# Patient Record
Sex: Female | Born: 1990 | Race: White | Hispanic: No | Marital: Single | Smoking: Former smoker
Health system: Southern US, Community
[De-identification: ages and names within clinical notes are randomized; demographics above are authoritative.]

## PROBLEM LIST (undated history)

## (undated) DIAGNOSIS — M25569 Pain in unspecified knee: Secondary | ICD-10-CM

## (undated) DIAGNOSIS — Z8719 Personal history of other diseases of the digestive system: Secondary | ICD-10-CM

## (undated) DIAGNOSIS — R51 Headache: Secondary | ICD-10-CM

## (undated) DIAGNOSIS — R519 Headache, unspecified: Secondary | ICD-10-CM

## (undated) DIAGNOSIS — O285 Abnormal chromosomal and genetic finding on antenatal screening of mother: Secondary | ICD-10-CM

## (undated) DIAGNOSIS — M545 Low back pain, unspecified: Secondary | ICD-10-CM

## (undated) DIAGNOSIS — F329 Major depressive disorder, single episode, unspecified: Secondary | ICD-10-CM

## (undated) DIAGNOSIS — K219 Gastro-esophageal reflux disease without esophagitis: Secondary | ICD-10-CM

## (undated) DIAGNOSIS — F39 Unspecified mood [affective] disorder: Secondary | ICD-10-CM

## (undated) DIAGNOSIS — F419 Anxiety disorder, unspecified: Secondary | ICD-10-CM

## (undated) DIAGNOSIS — G43109 Migraine with aura, not intractable, without status migrainosus: Secondary | ICD-10-CM

## (undated) DIAGNOSIS — G4700 Insomnia, unspecified: Secondary | ICD-10-CM

## (undated) DIAGNOSIS — F32A Depression, unspecified: Secondary | ICD-10-CM

## (undated) HISTORY — DX: Abnormal chromosomal and genetic finding on antenatal screening of mother: O28.5

## (undated) HISTORY — PX: TONSILLECTOMY: SUR1361

## (undated) HISTORY — DX: Pain in unspecified knee: M25.569

## (undated) HISTORY — DX: Major depressive disorder, single episode, unspecified: F32.9

## (undated) HISTORY — DX: Depression, unspecified: F32.A

## (undated) HISTORY — PX: WISDOM TOOTH EXTRACTION: SHX21

## (undated) HISTORY — DX: Migraine with aura, not intractable, without status migrainosus: G43.109

## (undated) HISTORY — DX: Low back pain, unspecified: M54.50

## (undated) HISTORY — DX: Insomnia, unspecified: G47.00

## (undated) HISTORY — DX: Unspecified mood (affective) disorder: F39

## (undated) HISTORY — PX: HEMORROIDECTOMY: SUR656

---
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
## 2010-11-02 ENCOUNTER — Ambulatory Visit: Payer: Self-pay | Admitting: Family Medicine

## 2013-04-21 ENCOUNTER — Inpatient Hospital Stay: Payer: Self-pay

## 2013-04-21 LAB — CBC WITH DIFFERENTIAL/PLATELET
Basophil %: 0.5 %
Eosinophil %: 1.4 %
HCT: 35.9 % (ref 35.0–47.0)
HGB: 11.9 g/dL — ABNORMAL LOW (ref 12.0–16.0)
Lymphocyte #: 1.9 10*3/uL (ref 1.0–3.6)
Lymphocyte %: 11.3 %
MCH: 28.5 pg (ref 26.0–34.0)
MCHC: 33.2 g/dL (ref 32.0–36.0)
MCV: 86 fL (ref 80–100)
Monocyte #: 0.8 x10 3/mm (ref 0.2–0.9)
Monocyte %: 4.9 %
Neutrophil #: 13.5 10*3/uL — ABNORMAL HIGH (ref 1.4–6.5)
Neutrophil %: 81.9 %
Platelet: 234 10*3/uL (ref 150–440)
RBC: 4.19 10*6/uL (ref 3.80–5.20)

## 2013-10-29 ENCOUNTER — Ambulatory Visit: Payer: Self-pay | Admitting: Internal Medicine

## 2014-05-29 NOTE — L&D Delivery Note (Signed)
Delivery Summary for Barry Dienes  Labor Events:   Preterm labor:   Rupture date:   Rupture time:   Rupture type:   Fluid Color:   Induction:   Augmentation:   Complications:   Cervical ripening:          Delivery:   Episiotomy:   Lacerations:   Repair suture:   Repair # of packets:   Blood loss (ml):    Information for the patient's newborn:  Makinzee, Durley [[id number]]    Delivery 02/24/2015 12:46 PM by  C-Section, Low Transverse Sex:  female Gestational Age: [redacted]w[redacted]d Delivery Clinician:  Rubie Maid Living?:         APGARS  One minute Five minutes Ten minutes  Skin color: 1   1      Heart rate: 2   2      Grimace: 2   2      Muscle tone: 2   2      Breathing: 2   2      Totals: 9  9      Presentation/position:      Resuscitation:   Cord information:    Disposition of cord blood:     Blood gases sent?  Complications:   Placenta: Delivered: 02/24/2015 12:47 PM  Manual removal   appearance Newborn Measurements: Weight: 6 lb 10.5 oz (3019 g)  Height:    Head circumference:    Chest circumference:    Other providers: Registered Nurse Registered Nurse Registered Nurse Neonatologist Nicholas Lose  Additional  information: Forceps:   Vacuum:   Breech:   Observed anomalies       See Cesarean section procedure note for details of surgery.    Rubie Maid, MD [facility]

## 2014-07-21 LAB — OB RESULTS CONSOLE PLATELET COUNT: PLATELETS: 295 10*3/uL

## 2014-07-21 LAB — OB RESULTS CONSOLE HIV ANTIBODY (ROUTINE TESTING): HIV: NONREACTIVE

## 2014-07-21 LAB — OB RESULTS CONSOLE VARICELLA ZOSTER ANTIBODY, IGG: VARICELLA IGG: IMMUNE

## 2014-07-21 LAB — OB RESULTS CONSOLE GC/CHLAMYDIA
Chlamydia: NEGATIVE
Gonorrhea: NEGATIVE

## 2014-07-21 LAB — OB RESULTS CONSOLE ABO/RH: RH TYPE: POSITIVE

## 2014-07-21 LAB — OB RESULTS CONSOLE HGB/HCT, BLOOD
HCT: 41 %
Hemoglobin: 13.8 g/dL

## 2014-07-21 LAB — OB RESULTS CONSOLE RUBELLA ANTIBODY, IGM: Rubella: IMMUNE

## 2014-07-21 LAB — OB RESULTS CONSOLE HEPATITIS B SURFACE ANTIGEN: HEP B S AG: NEGATIVE

## 2014-07-21 LAB — OB RESULTS CONSOLE RPR: RPR: NONREACTIVE

## 2014-07-21 LAB — OB RESULTS CONSOLE ANTIBODY SCREEN: Antibody Screen: NEGATIVE

## 2014-09-18 NOTE — Op Note (Signed)
PATIENT NAME:  Christine Arellano, Christine Arellano MR#:  [id number] DATE OF BIRTH:  [birth year]  DATE OF PROCEDURE:  04/21/2013  PREOPERATIVE DIAGNOSIS:  1.  40-6/7 week intrauterine pregnancy. 2.  Fetal intolerance to labor.  POSTOPERATIVE DIAGNOSIS:  1.  40-6/7 week intrauterine pregnancy. 2.  Fetal intolerance to labor.  PROCEDURE PERFORMED: Primary low transverse cesarean section.  SURGEON: Fulton Merry S. Marcelline Mates, MD  ASSISTANT: Gennie Alma, CNM.  ANESTHESIA: Spinal.  COMPLICATIONS: None.  ESTIMATED BLOOD LOSS: 500 mL.  FLUIDS: 1200 mL.  URINE OUTPUT: 200 mL clear urine at the end of the procedure.  FINDINGS: Female infant in cephalic presentation, clear amniotic fluid, APGARS 8 and 9.  There was a nuchal cord x1 that was reducible. Three-vessel cord of placenta. A normal-appearing uterus, tubes and ovaries.  PROCEDURE: The patient was taken to the operating room, where spinal anesthesia was placed and found to be adequate. She was then prepped and draped in the normal sterile fashion in the dorsal supine position with a leftward tilt. A Pfannenstiel skin incision was then made with a scalpel and carried down to the underlying layer of the fascia with the Bovie. The fascia was then incised in the midline, and the incision was extended laterally with Mayo scissors. The superior aspect of the fascial incision was then grasped with Kocher clamps, elevated, and the underlying rectus muscles were dissected off bluntly.  Attention was then turned to the inferior aspect of the incision, which, in similar fashion, was grasped, tented up with Kocher clamps, and the rectus muscles dissected off bluntly. The rectus muscles were then separated in the midline. The peritoneum was identified and entered bluntly. The peritoneal incision was then extended superiorly and inferiorly with good visualization of the bladder. The bladder blade was then inserted, and the vesicouterine peritoneum was identified, grasped with pickups and  entered sharply with Metzenbaum scissors.  The incision was then extended laterally and the bladder flap was created digitally. The bladder blade was then reinserted and the lower uterine segment was incised in transverse fashion with a scalpel. The uterine incision was then extended laterally. The bladder blade was removed, and the infant's head was delivered atraumatically. The nose and mouth were suctioned using bulb suction. A nuchal cord x1 was noted to be present and was easily reduced. The cord was then clamped and cut after delivery of the remainder of the fetus, and the infant was handed off to the waiting pediatricians.  Cord blood was collected. The placenta was then removed manually. The uterus was exteriorized and cleared of all clots and debris. The uterine incision was then repaired with a #0 Vicryl in a running locked fashion. A second layer of the same suture was used to obtain hemostasis. After this, the uterus was returned to the abdomen. The gutters were cleared of all clots and the peritoneum was closed with a 3-0 Vicryl. The fascia was then reapproximated with #0 Vicryl in a running fashion. The skin was closed with 4-0 Monocryl.  The patient tolerated the procedure well. Sponge, lap, and needle counts were correct x2. 2 grams of Ancef was given prior to skin incision. The patient was taken to the recovery room in stable condition.     ____________________________ Chesley Noon Marcelline Mates, MD asc:cg D: 04/21/2013 20:29:09 ET T: 04/22/2013 04:53:13 ET JOB#: [id number]  cc: Chesley Noon. Marcelline Mates, MD, <Dictator> Augusto Gamble MD ELECTRONICALLY SIGNED 04/29/2013 16:20

## 2014-10-06 NOTE — H&P (Signed)
L&D Evaluation:  History:  HPI 24yo SWF presents in active labor at [redacted]w[redacted]d, G1P0000, normal PNC to date.   Presents with contractions   Patient's Medical History depression   Patient's Surgical History other  T&A, Knee surgery 2008   Medications Pre Natal Vitamins  Tylenol (Acetaminophen)  Colace prn   Allergies NKDA   Social History none   Family History Non-Contributory   ROS:  ROS All systems were reviewed.  HEENT, CNS, GI, GU, Respiratory, CV, Renal and Musculoskeletal systems were found to be normal.   Exam:  Vital Signs stable   General no apparent distress   Mental Status clear   Chest clear   Heart normal sinus rhythm   Abdomen gravid, tender with contractions   Estimated Fetal Weight Average for gestational age   Fetal Position vtx   Edema no edema   Mebranes Intact   FHT normal rate with no decels   FHT Description 138   Ucx irregular   Ucx Frequency 5 min   Length of each Contraction 60 seconds   Ucx Pain Scale 0, epidural in place   Impression:  Impression active labor   Plan:  Plan monitor contractions and for cervical change, pitocin augmentation   Electronic Signatures: Evonnie Pat (CNM)  (Signed 24-Nov-14 05:14)  Authored: L&D Evaluation   Last Updated: 24-Nov-14 05:14 by Evonnie Pat (CNM)

## 2014-11-05 ENCOUNTER — Encounter: Payer: Self-pay | Admitting: *Deleted

## 2014-11-06 ENCOUNTER — Encounter: Payer: Self-pay | Admitting: Obstetrics and Gynecology

## 2014-11-06 ENCOUNTER — Ambulatory Visit (INDEPENDENT_AMBULATORY_CARE_PROVIDER_SITE_OTHER): Payer: Medicaid Other | Admitting: Obstetrics and Gynecology

## 2014-11-06 VITALS — BP 113/69 | HR 88 | Ht 62.0 in | Wt 179.6 lb

## 2014-11-06 DIAGNOSIS — Z3482 Encounter for supervision of other normal pregnancy, second trimester: Secondary | ICD-10-CM

## 2014-11-06 DIAGNOSIS — Z3492 Encounter for supervision of normal pregnancy, unspecified, second trimester: Secondary | ICD-10-CM

## 2014-11-06 LAB — POCT URINALYSIS DIPSTICK
Bilirubin, UA: NEGATIVE
Glucose, UA: NEGATIVE
KETONES UA: NEGATIVE
LEUKOCYTES UA: NEGATIVE
NITRITE UA: NEGATIVE
PH UA: 6
Protein, UA: NEGATIVE
RBC UA: NEGATIVE
Spec Grav, UA: 1.01
Urobilinogen, UA: 0.2

## 2014-11-06 MED ORDER — HYDROCODONE-ACETAMINOPHEN 10-325 MG PO TABS: 1.0000 | ORAL_TABLET | Freq: Four times a day (QID) | ORAL | Status: DC | PRN

## 2014-11-06 MED ORDER — RANITIDINE HCL 150 MG PO TABS: 150.0000 mg | ORAL_TABLET | Freq: Two times a day (BID) | ORAL | Status: DC

## 2014-11-06 NOTE — Progress Notes (Signed)
Pt is having acid reflux.

## 2014-11-06 NOTE — Progress Notes (Signed)
ROB- 24yo [id number] with previous c/s for FTP at term; doing well w/o complaint except for daily heartburn and reflux- not relieved with TUMs- Rx zantac given; also request refill on vicoden for HAs, has to take 2-3 tablets a week.

## 2014-11-26 ENCOUNTER — Telehealth: Payer: Self-pay | Admitting: Obstetrics and Gynecology

## 2014-11-26 NOTE — Telephone Encounter (Signed)
Called pt gave her [place] number to call about EOB

## 2014-11-26 NOTE — Telephone Encounter (Signed)
Patient called regarding a bill she received from [place]. They are telling her she is responsible for over $8000. Can you talk to Bing(chow) about this. Thanks

## 2014-12-03 ENCOUNTER — Encounter: Payer: Self-pay | Admitting: Obstetrics and Gynecology

## 2014-12-03 ENCOUNTER — Other Ambulatory Visit: Payer: Medicaid Other

## 2014-12-03 ENCOUNTER — Ambulatory Visit (INDEPENDENT_AMBULATORY_CARE_PROVIDER_SITE_OTHER): Payer: Medicaid Other | Admitting: Obstetrics and Gynecology

## 2014-12-03 VITALS — BP 110/66 | HR 77 | Wt 182.5 lb

## 2014-12-03 DIAGNOSIS — Z3493 Encounter for supervision of normal pregnancy, unspecified, third trimester: Secondary | ICD-10-CM

## 2014-12-03 DIAGNOSIS — Z331 Pregnant state, incidental: Secondary | ICD-10-CM | POA: Diagnosis not present

## 2014-12-03 DIAGNOSIS — Z131 Encounter for screening for diabetes mellitus: Secondary | ICD-10-CM

## 2014-12-03 LAB — POCT URINALYSIS DIPSTICK
Blood, UA: NEGATIVE
GLUCOSE UA: NEGATIVE
Ketones, UA: NEGATIVE
LEUKOCYTES UA: NEGATIVE
Nitrite, UA: NEGATIVE
PH UA: 7
SPEC GRAV UA: 1.01
Urobilinogen, UA: 0.2

## 2014-12-03 MED ORDER — SERTRALINE HCL 50 MG PO TABS: 50.0000 mg | ORAL_TABLET | Freq: Every day | ORAL | Status: DC

## 2014-12-03 MED ORDER — HYDROCODONE-ACETAMINOPHEN 10-325 MG PO TABS: 1.0000 | ORAL_TABLET | Freq: Four times a day (QID) | ORAL | Status: DC | PRN

## 2014-12-03 MED ORDER — TETANUS-DIPHTH-ACELL PERTUSSIS 5-2.5-18.5 LF-MCG/0.5 IM SUSP
0.5000 mL | Freq: Once | INTRAMUSCULAR | Status: AC
  Administered 2014-12-03: 0.5 mL via INTRAMUSCULAR

## 2014-12-03 NOTE — Progress Notes (Signed)
ROB & Glucola- Tdap given & Blood consent signed; Discussed depression worsening- OK to take meds- will start Zoloft 50mg  daily

## 2014-12-03 NOTE — Progress Notes (Signed)
Pt states the heartburn is better as long as she takes the medication, is still having headaches Pt is struggling with depression, feeling very "fat", states its affecting her relationship, would like to discuss with you

## 2014-12-03 NOTE — Patient Instructions (Signed)

## 2014-12-04 ENCOUNTER — Telehealth: Payer: Self-pay | Admitting: *Deleted

## 2014-12-04 LAB — GLUCOSE, 1 HOUR GESTATIONAL: Gestational Diabetes Screen: 127 mg/dL (ref 65–139)

## 2014-12-04 LAB — HEMOGLOBIN AND HEMATOCRIT, BLOOD
HEMATOCRIT: 38 % (ref 34.0–46.6)
Hemoglobin: 12.6 g/dL (ref 11.1–15.9)

## 2014-12-04 NOTE — Telephone Encounter (Signed)
-----   Message from Evonnie Pat, [place] sent at 12/04/2014  8:17 AM EDT ----- Please let her know she passed her glucola and no sins of anemia

## 2014-12-04 NOTE — Telephone Encounter (Signed)
Notified pt of normal lab results

## 2014-12-17 ENCOUNTER — Encounter: Payer: Self-pay | Admitting: Obstetrics and Gynecology

## 2014-12-17 ENCOUNTER — Ambulatory Visit (INDEPENDENT_AMBULATORY_CARE_PROVIDER_SITE_OTHER): Payer: Medicaid Other | Admitting: Obstetrics and Gynecology

## 2014-12-17 VITALS — BP 106/78 | HR 78 | Wt 182.2 lb

## 2014-12-17 DIAGNOSIS — Z3493 Encounter for supervision of normal pregnancy, unspecified, third trimester: Secondary | ICD-10-CM

## 2014-12-17 LAB — POCT URINALYSIS DIPSTICK
Blood, UA: NEGATIVE
Glucose, UA: NEGATIVE
KETONES UA: NEGATIVE
Leukocytes, UA: NEGATIVE
NITRITE UA: NEGATIVE
PH UA: 6.5
SPEC GRAV UA: 1.01
Urobilinogen, UA: 0.2

## 2014-12-17 NOTE — Progress Notes (Signed)
Pt is here states she feels a little better, doesn't really have a apetitie

## 2014-12-17 NOTE — Progress Notes (Signed)
ROB- feeling a little better, but no appetite, to push frequent small snacks and increase fluid intake.

## 2014-12-22 ENCOUNTER — Telehealth: Payer: Self-pay | Admitting: Internal Medicine

## 2014-12-22 ENCOUNTER — Telehealth: Payer: Self-pay | Admitting: Obstetrics and Gynecology

## 2014-12-22 NOTE — Telephone Encounter (Signed)
Pt would like to have her teeth cleaned, if ok i will fax note to dentist

## 2014-12-22 NOTE — Telephone Encounter (Signed)
error 

## 2014-12-22 NOTE — Telephone Encounter (Signed)
Ys, please fax it in

## 2014-12-22 NOTE — Telephone Encounter (Signed)
Letter was faxed to [facility]

## 2014-12-22 NOTE — Telephone Encounter (Signed)
Pt needs a dental letter faxed to [facility] in [place]. Fax number [phone].

## 2014-12-31 ENCOUNTER — Ambulatory Visit (INDEPENDENT_AMBULATORY_CARE_PROVIDER_SITE_OTHER): Payer: Medicaid Other | Admitting: Obstetrics and Gynecology

## 2014-12-31 ENCOUNTER — Encounter: Payer: Self-pay | Admitting: Obstetrics and Gynecology

## 2014-12-31 VITALS — BP 100/59 | HR 106 | Wt 182.4 lb

## 2014-12-31 DIAGNOSIS — Z3493 Encounter for supervision of normal pregnancy, unspecified, third trimester: Secondary | ICD-10-CM

## 2014-12-31 LAB — POCT URINALYSIS DIPSTICK
BILIRUBIN UA: NEGATIVE
Blood, UA: NEGATIVE
Glucose, UA: NEGATIVE
Ketones, UA: NEGATIVE
LEUKOCYTES UA: NEGATIVE
Nitrite, UA: NEGATIVE
Spec Grav, UA: 1.01
Urobilinogen, UA: 0.2
pH, UA: 6.5

## 2014-12-31 MED ORDER — DOCUSATE SODIUM 100 MG PO CAPS: 100.0000 mg | ORAL_CAPSULE | Freq: Every day | ORAL | Status: DC

## 2014-12-31 MED ORDER — SERTRALINE HCL 100 MG PO TABS: 50.0000 mg | ORAL_TABLET | Freq: Every day | ORAL | Status: DC

## 2014-12-31 MED ORDER — HYDROCODONE-ACETAMINOPHEN 10-325 MG PO TABS: 1.0000 | ORAL_TABLET | Freq: Four times a day (QID) | ORAL | Status: DC | PRN

## 2014-12-31 MED ORDER — RANITIDINE HCL 150 MG PO TABS: 150.0000 mg | ORAL_TABLET | Freq: Two times a day (BID) | ORAL | Status: DC

## 2014-12-31 NOTE — Progress Notes (Signed)
ROB- meds refilled- increased zoloft to 100mg  daily as she doesn't feel like it has helped any, needs appt with Dr Marcelline Mates to schedule pre-op and repeat c/s; reports not being able to fall asleep- to try Tylenol PM.

## 2014-12-31 NOTE — Progress Notes (Signed)
Pt states her depression isnt really any better, not any worse, just not happy Pt is having some increased pelvic pressure

## 2015-01-01 ENCOUNTER — Encounter: Payer: Medicaid Other | Admitting: Obstetrics and Gynecology

## 2015-01-01 ENCOUNTER — Telehealth: Payer: Self-pay | Admitting: Obstetrics and Gynecology

## 2015-01-01 MED ORDER — SERTRALINE HCL 100 MG PO TABS: 100.0000 mg | ORAL_TABLET | Freq: Every day | ORAL | Status: DC

## 2015-01-01 NOTE — Telephone Encounter (Signed)
zoloft  They gave her 100 mg pill and told her to break it in half, so that she would be taking same dose as before and she said Mel upped the dose

## 2015-01-01 NOTE — Telephone Encounter (Signed)
Hey which dose is pt to be on

## 2015-01-01 NOTE — Telephone Encounter (Signed)
Pt is to take 100mg  daily, sent rx to [address]

## 2015-01-01 NOTE — Telephone Encounter (Signed)
Tell her to take the whole pill, and notify pharmacy that she is now on 100mg  daily

## 2015-01-12 ENCOUNTER — Encounter: Payer: Self-pay | Admitting: Obstetrics and Gynecology

## 2015-01-12 ENCOUNTER — Ambulatory Visit (INDEPENDENT_AMBULATORY_CARE_PROVIDER_SITE_OTHER): Payer: Medicaid Other | Admitting: Obstetrics and Gynecology

## 2015-01-12 VITALS — BP 118/82 | HR 102 | Wt 175.1 lb

## 2015-01-12 DIAGNOSIS — F32A Depression, unspecified: Secondary | ICD-10-CM

## 2015-01-12 DIAGNOSIS — F329 Major depressive disorder, single episode, unspecified: Secondary | ICD-10-CM

## 2015-01-12 NOTE — Progress Notes (Signed)
ROB-ob work in pt is very tearful, upset, hasn't been eating for over 1 week, wants reassurance that baby is ok

## 2015-01-12 NOTE — Progress Notes (Signed)
Problem OB-reports partner has asked her to leave, stating he doesn't want to be with her anymore, has no appetite, and not eating any food, just drinking juice drinks, has diarrhea (common for her when stressed); denies suicidal intent; has been on increased dose zoloft since last visit. But with current circumstances can't tell if it is working; has mother local and sister (in [place]) for support, although states mom is very negative and makes her feel worse. Recommend appt with counselor and assistance offered through [facility]- reluctantly agreed and will contact her case worker to set it up ASAP. Otherwise developed plan to eat freq. Small snacks high in protein and stop juice drinks.  Will keep appt with Dr Marcelline Mates in 3 days for preop.

## 2015-01-15 ENCOUNTER — Encounter: Payer: Self-pay | Admitting: Obstetrics and Gynecology

## 2015-01-15 ENCOUNTER — Ambulatory Visit (INDEPENDENT_AMBULATORY_CARE_PROVIDER_SITE_OTHER): Payer: Medicaid Other | Admitting: Obstetrics and Gynecology

## 2015-01-15 VITALS — BP 109/65 | HR 103 | Wt 174.0 lb

## 2015-01-15 DIAGNOSIS — O99343 Other mental disorders complicating pregnancy, third trimester: Secondary | ICD-10-CM

## 2015-01-15 DIAGNOSIS — O34219 Maternal care for unspecified type scar from previous cesarean delivery: Secondary | ICD-10-CM

## 2015-01-15 DIAGNOSIS — Z3493 Encounter for supervision of normal pregnancy, unspecified, third trimester: Secondary | ICD-10-CM

## 2015-01-15 DIAGNOSIS — O3421 Maternal care for scar from previous cesarean delivery: Secondary | ICD-10-CM

## 2015-01-15 DIAGNOSIS — F329 Major depressive disorder, single episode, unspecified: Secondary | ICD-10-CM

## 2015-01-15 LAB — POCT URINALYSIS DIPSTICK
BILIRUBIN UA: NEGATIVE
Blood, UA: NEGATIVE
Glucose, UA: NEGATIVE
LEUKOCYTES UA: NEGATIVE
Nitrite, UA: NEGATIVE
PROTEIN UA: NEGATIVE
Urobilinogen, UA: 0.2
pH, UA: 6

## 2015-01-15 NOTE — Progress Notes (Signed)
See AC per mnb to schedule c/s.

## 2015-01-16 DIAGNOSIS — O99343 Other mental disorders complicating pregnancy, third trimester: Secondary | ICD-10-CM | POA: Insufficient documentation

## 2015-01-16 DIAGNOSIS — O34219 Maternal care for unspecified type scar from previous cesarean delivery: Secondary | ICD-10-CM | POA: Insufficient documentation

## 2015-01-16 DIAGNOSIS — F329 Major depressive disorder, single episode, unspecified: Secondary | ICD-10-CM | POA: Insufficient documentation

## 2015-01-16 NOTE — Progress Notes (Signed)
ROB Consult: Patient reports that she is doing ok.  Managing social issues as best as she can.  Denies OB complaints.  Presents for pre-op, scheduling of repeat C-section (h/o prior C-section x 1, declines TOLAC).  Counseled on procedure, including risks, benefits.  Consent forms signed.  Scheduled for 02/23/2015. RTC in 2 weeks with MNB for routine OB visit.

## 2015-01-16 NOTE — H&P (Signed)
Obstetric Preoperative History and Physical  Christine Arellano is a 24 y.o. G3P1011 with IUP at [redacted]w[redacted]d presenting for pre-operative exam for scheduled repeat cesarean section. Scheduled for 02/23/2015 at [redacted] weeks gestation.  No acute concerns.   Prenatal Course Source of Care: [facility] with onset of care at 11 weeks Pregnancy complications or risks: Patient Active Problem List   Diagnosis Date Noted  . Depression affecting pregnancy in third trimester, antepartum 01/16/2015  . History of cesarean delivery affecting pregnancy 01/16/2015   She plans to breastfeed She is undecided  for postpartum contraception, but declines permanent sterilization.   Prenatal labs and studies: ABO, Rh: A/Positive/-- (02/23 0000) Antibody: Negative (02/23 0000) Rubella: Immune (02/23 0000) RPR: Nonreactive (02/23 0000)  HBsAg: Negative (02/23 0000)  HIV: Non-reactive (02/23 0000)  GBS:  pending 1 hr Glucola  normal Genetic screening normal Anatomy US normal  Prenatal Transfer Tool  Maternal Diabetes: No Genetic Screening: Normal Maternal Ultrasounds/Referrals: Normal Fetal Ultrasounds or other Referrals:  None Maternal Substance Abuse:  No Significant Maternal Medications:  Meds include: Zoloft Significant Maternal Lab Results: None  Past Medical History  Diagnosis Date  . Insomnia   . Depression     Past Surgical History  Procedure Laterality Date  . Hemorroidectomy    . Tonsillectomy      OB History  Gravida Para Term Preterm AB SAB TAB Ectopic Multiple Living  3 1 1  1 1    1     # Outcome Date GA Lbr Len/2nd Weight Sex Delivery Anes PTL Lv  3 Current           2 SAB 02/2014          1 Term 04/21/13    M CS-Unspec   Y     Complications: Fetal Intolerance      Social History   Social History  . Marital Status: Single    Spouse Name: N/A  . Number of Children: N/A  . Years of Education: N/A   Social History Main Topics  . Smoking status: Former Research scientist (life sciences)  .  Smokeless tobacco: Never Used  . Alcohol Use: No  . Drug Use: No  . Sexual Activity: Yes    Birth Control/ Protection: None   Other Topics Concern  . None   Social History Narrative    Family History  Problem Relation Age of Onset  . Depression Father   . Cancer Paternal Aunt     breast  . Heart disease Paternal Uncle      (Not in a hospital admission)  No Known Allergies  Review of Systems: Negative except for what is mentioned in HPI.  Physical Exam: BP 109/65 mmHg  Pulse 103  Wt 174 lb (78.926 kg)  LMP 05/26/2014 (LMP Unknown) FHR by Doppler: 143 bpm GENERAL: Well-developed, well-nourished female in no acute distress.  LUNGS: Clear to auscultation bilaterally.  HEART: Regular rate and rhythm. ABDOMEN: Soft, nontender, nondistended, gravid, well-healed Pfannenstiel incision. PELVIC: Deferred EXTREMITIES: Nontender, no edema, 2+ distal pulses.   Pertinent Labs/Studies:   Results for orders placed or performed in visit on 01/15/15 (from the past 72 hour(s))  POCT urinalysis dipstick     Status: None   Collection Time: 01/15/15  9:45 AM  Result Value Ref Range   Color, UA dark yellow    Clarity, UA clear    Glucose, UA neg    Bilirubin, UA neg    Ketones, UA large    Spec Grav, UA <=1.005  Blood, UA neg    pH, UA 6.0    Protein, UA neg    Urobilinogen, UA 0.2    Nitrite, UA neg    Leukocytes, UA Negative Negative    Assessment and Plan :Christine Arellano is a 24 y.o. G3P1011 at [redacted]w[redacted]d who is scheduled cesarean section delivery at [redacted] weeks gestation on 9/272016 . The patient is understanding of the planned procedure and is aware of and accepting of all surgical risks, including but not limited to: bleeding which may require transfusion or reoperation; infection which may require antibiotics; injury to bowel, bladder, ureters or other surrounding organs which may require repair; injury to the fetus; need for additional procedures including hysterectomy in the  event of life-threatening complications; placental abnormalities wth subsequent pregnancies; incisional problems; blood clot disorders which may require blood thinners;, and other postoperative/anesthesia complications. The patient is in agreement with the proposed plan, and gives informed written consent for the procedure. All questions have been answered.   Rubie Maid, MD [facility]

## 2015-01-28 ENCOUNTER — Ambulatory Visit (INDEPENDENT_AMBULATORY_CARE_PROVIDER_SITE_OTHER): Payer: Medicaid Other | Admitting: Obstetrics and Gynecology

## 2015-01-28 ENCOUNTER — Encounter: Payer: Self-pay | Admitting: Obstetrics and Gynecology

## 2015-01-28 VITALS — BP 96/57 | HR 86 | Wt 175.5 lb

## 2015-01-28 DIAGNOSIS — Z331 Pregnant state, incidental: Secondary | ICD-10-CM

## 2015-01-28 LAB — POCT URINALYSIS DIPSTICK
Bilirubin, UA: NEGATIVE
Glucose, UA: NEGATIVE
Ketones, UA: 80
Leukocytes, UA: NEGATIVE
NITRITE UA: NEGATIVE
PH UA: 6.5
RBC UA: NEGATIVE
Spec Grav, UA: 1.02
UROBILINOGEN UA: 0.2

## 2015-01-28 NOTE — Progress Notes (Signed)
+                ROB- feeling better, sleeping a little more- taking Tylenol PM as needed, cultures next visit.

## 2015-01-28 NOTE — Progress Notes (Signed)
ROB-pt is doing some better, pt had moved in with her mother,pt is having some sharp pains in her abd

## 2015-02-05 ENCOUNTER — Ambulatory Visit (INDEPENDENT_AMBULATORY_CARE_PROVIDER_SITE_OTHER): Payer: Medicaid Other | Admitting: Obstetrics and Gynecology

## 2015-02-05 ENCOUNTER — Encounter: Payer: Self-pay | Admitting: Obstetrics and Gynecology

## 2015-02-05 VITALS — BP 109/63 | HR 85 | Wt 178.4 lb

## 2015-02-05 DIAGNOSIS — B359 Dermatophytosis, unspecified: Secondary | ICD-10-CM

## 2015-02-05 DIAGNOSIS — Z3493 Encounter for supervision of normal pregnancy, unspecified, third trimester: Secondary | ICD-10-CM | POA: Diagnosis not present

## 2015-02-05 DIAGNOSIS — Z113 Encounter for screening for infections with a predominantly sexual mode of transmission: Secondary | ICD-10-CM

## 2015-02-05 DIAGNOSIS — Z36 Encounter for antenatal screening of mother: Secondary | ICD-10-CM

## 2015-02-05 DIAGNOSIS — Z3685 Encounter for antenatal screening for Streptococcus B: Secondary | ICD-10-CM

## 2015-02-05 MED ORDER — TERCONAZOLE 0.4 % VA CREA: 1.0000 | TOPICAL_CREAM | Freq: Every day | VAGINAL | Status: DC

## 2015-02-05 MED ORDER — INFLUENZA VAC SPLIT QUAD 0.5 ML IM SUSY
0.5000 mL | PREFILLED_SYRINGE | Freq: Once | INTRAMUSCULAR | Status: AC
  Administered 2015-02-05: 0.5 mL via INTRAMUSCULAR

## 2015-02-05 MED ORDER — DOCUSATE SODIUM 100 MG PO CAPS: 100.0000 mg | ORAL_CAPSULE | Freq: Every day | ORAL | Status: DC

## 2015-02-05 MED ORDER — SERTRALINE HCL 100 MG PO TABS: 100.0000 mg | ORAL_TABLET | Freq: Every day | ORAL | Status: DC

## 2015-02-05 NOTE — Progress Notes (Signed)
ROB- cultures obtained, doing better with depression; tinea of groin noted- been using monistat x 2 weeks, will switch to terazol

## 2015-02-05 NOTE — Progress Notes (Signed)
ROB-cultures obtained today, c/o B groin rash, red, tender Flu vaccine given

## 2015-02-07 LAB — STREP GP B NAA: Strep Gp B NAA: NEGATIVE

## 2015-02-08 LAB — GC/CHLAMYDIA PROBE AMP
Chlamydia trachomatis, NAA: NEGATIVE
NEISSERIA GONORRHOEAE BY PCR: NEGATIVE

## 2015-02-12 ENCOUNTER — Encounter: Payer: Medicaid Other | Admitting: Obstetrics and Gynecology

## 2015-02-16 ENCOUNTER — Encounter: Payer: Self-pay | Admitting: Obstetrics and Gynecology

## 2015-02-16 ENCOUNTER — Ambulatory Visit (INDEPENDENT_AMBULATORY_CARE_PROVIDER_SITE_OTHER): Payer: Medicaid Other | Admitting: Obstetrics and Gynecology

## 2015-02-16 VITALS — BP 104/59 | HR 81 | Wt 174.7 lb

## 2015-02-16 DIAGNOSIS — Z3493 Encounter for supervision of normal pregnancy, unspecified, third trimester: Secondary | ICD-10-CM

## 2015-02-16 LAB — POCT URINALYSIS DIPSTICK
BILIRUBIN UA: NEGATIVE
Blood, UA: NEGATIVE
GLUCOSE UA: NEGATIVE
KETONES UA: NEGATIVE
LEUKOCYTES UA: NEGATIVE
Nitrite, UA: NEGATIVE
Protein, UA: NEGATIVE
SPEC GRAV UA: 1.015
Urobilinogen, UA: 0.2
pH, UA: 7

## 2015-02-16 MED ORDER — HYDROCODONE-ACETAMINOPHEN 10-325 MG PO TABS: 1.0000 | ORAL_TABLET | Freq: Four times a day (QID) | ORAL | Status: DC | PRN

## 2015-02-16 NOTE — Progress Notes (Signed)
ROB- pt is having some pelvic pressure 

## 2015-02-16 NOTE — Progress Notes (Signed)
ROB- doing better; denies concerns

## 2015-02-19 ENCOUNTER — Encounter: Payer: Medicaid Other | Admitting: Obstetrics and Gynecology

## 2015-02-23 ENCOUNTER — Encounter
Admission: RE | Admit: 2015-02-23 | Discharge: 2015-02-23 | Disposition: A | Discharge status: Home / Self Care | Payer: Medicaid Other | Source: Ambulatory Visit | Attending: Obstetrics and Gynecology | Admitting: Obstetrics and Gynecology

## 2015-02-23 HISTORY — DX: Gastro-esophageal reflux disease without esophagitis: K21.9

## 2015-02-23 LAB — CBC
HEMATOCRIT: 36.4 % (ref 35.0–47.0)
HEMOGLOBIN: 11.8 g/dL — AB (ref 12.0–16.0)
MCH: 27.7 pg (ref 26.0–34.0)
MCHC: 32.3 g/dL (ref 32.0–36.0)
MCV: 85.5 fL (ref 80.0–100.0)
Platelets: 219 10*3/uL (ref 150–440)
RBC: 4.26 MIL/uL (ref 3.80–5.20)
RDW: 14.6 % — ABNORMAL HIGH (ref 11.5–14.5)
WBC: 8.9 10*3/uL (ref 3.6–11.0)

## 2015-02-23 LAB — TYPE AND SCREEN
ABO/RH(D): A POS
ANTIBODY SCREEN: NEGATIVE

## 2015-02-23 LAB — ABO/RH: ABO/RH(D): A POS

## 2015-02-23 MED ORDER — LACTATED RINGERS IV SOLN
INTRAVENOUS | Status: DC
  Filled 2015-02-23: qty 1000

## 2015-02-24 ENCOUNTER — Inpatient Hospital Stay: Payer: Medicaid Other | Admitting: Anesthesiology

## 2015-02-24 ENCOUNTER — Encounter
Admission: RE | Disposition: A | Discharge status: Home / Self Care | Payer: Self-pay | Source: Ambulatory Visit | Attending: Obstetrics and Gynecology

## 2015-02-24 ENCOUNTER — Inpatient Hospital Stay
Admission: RE | Admit: 2015-02-24 | Discharge: 2015-02-27 | DRG: 765 | Disposition: A | Discharge status: Home / Self Care | Payer: Medicaid Other | Source: Ambulatory Visit | Attending: Obstetrics and Gynecology | Admitting: Obstetrics and Gynecology

## 2015-02-24 ENCOUNTER — Encounter: Payer: Self-pay | Admitting: *Deleted

## 2015-02-24 DIAGNOSIS — G4700 Insomnia, unspecified: Secondary | ICD-10-CM | POA: Diagnosis present

## 2015-02-24 DIAGNOSIS — Z98891 History of uterine scar from previous surgery: Secondary | ICD-10-CM

## 2015-02-24 DIAGNOSIS — F329 Major depressive disorder, single episode, unspecified: Secondary | ICD-10-CM | POA: Diagnosis present

## 2015-02-24 DIAGNOSIS — D62 Acute posthemorrhagic anemia: Secondary | ICD-10-CM | POA: Diagnosis not present

## 2015-02-24 DIAGNOSIS — O9081 Anemia of the puerperium: Secondary | ICD-10-CM | POA: Diagnosis not present

## 2015-02-24 DIAGNOSIS — K219 Gastro-esophageal reflux disease without esophagitis: Secondary | ICD-10-CM | POA: Diagnosis present

## 2015-02-24 DIAGNOSIS — Z3A39 39 weeks gestation of pregnancy: Secondary | ICD-10-CM

## 2015-02-24 DIAGNOSIS — O34219 Maternal care for unspecified type scar from previous cesarean delivery: Secondary | ICD-10-CM | POA: Diagnosis present

## 2015-02-24 DIAGNOSIS — O34211 Maternal care for low transverse scar from previous cesarean delivery: Principal | ICD-10-CM | POA: Diagnosis present

## 2015-02-24 DIAGNOSIS — O99343 Other mental disorders complicating pregnancy, third trimester: Secondary | ICD-10-CM | POA: Diagnosis present

## 2015-02-24 DIAGNOSIS — O99344 Other mental disorders complicating childbirth: Secondary | ICD-10-CM | POA: Diagnosis present

## 2015-02-24 DIAGNOSIS — Z3493 Encounter for supervision of normal pregnancy, unspecified, third trimester: Secondary | ICD-10-CM

## 2015-02-24 DIAGNOSIS — Z87891 Personal history of nicotine dependence: Secondary | ICD-10-CM

## 2015-02-24 HISTORY — DX: History of uterine scar from previous surgery: Z98.891

## 2015-02-24 LAB — RPR: RPR Ser Ql: NONREACTIVE

## 2015-02-24 SURGERY — Surgical Case
Anesthesia: Spinal

## 2015-02-24 MED ORDER — LACTATED RINGERS IV SOLN
INTRAVENOUS | Status: DC
  Administered 2015-02-24: 12:00:00 via INTRAVENOUS

## 2015-02-24 MED ORDER — SCOPOLAMINE 1 MG/3DAYS TD PT72
1.0000 | MEDICATED_PATCH | Freq: Once | TRANSDERMAL | Status: DC
  Filled 2015-02-24: qty 1

## 2015-02-24 MED ORDER — PHENYLEPHRINE HCL 10 MG/ML IJ SOLN
INTRAMUSCULAR | Status: DC | PRN
  Administered 2015-02-24: 100 ug via INTRAVENOUS

## 2015-02-24 MED ORDER — IBUPROFEN 800 MG PO TABS
800.0000 mg | ORAL_TABLET | Freq: Three times a day (TID) | ORAL | Status: DC
  Administered 2015-02-24 – 2015-02-27 (×9): 800 mg via ORAL
  Filled 2015-02-24 (×9): qty 1

## 2015-02-24 MED ORDER — EPHEDRINE SULFATE 50 MG/ML IJ SOLN
INTRAMUSCULAR | Status: DC | PRN
  Administered 2015-02-24: 10 mg via INTRAVENOUS

## 2015-02-24 MED ORDER — MEASLES, MUMPS & RUBELLA VAC ~~LOC~~ INJ: 0.5000 mL | INJECTION | Freq: Once | SUBCUTANEOUS | Status: DC

## 2015-02-24 MED ORDER — MEPERIDINE HCL 25 MG/ML IJ SOLN: 6.2500 mg | INTRAMUSCULAR | Status: DC | PRN

## 2015-02-24 MED ORDER — OXYTOCIN 40 UNITS IN LACTATED RINGERS INFUSION - SIMPLE MED
62.5000 mL/h | INTRAVENOUS | Status: AC
  Administered 2015-02-24: 62.5 mL/h via INTRAVENOUS

## 2015-02-24 MED ORDER — SENNOSIDES-DOCUSATE SODIUM 8.6-50 MG PO TABS
2.0000 | ORAL_TABLET | ORAL | Status: DC
  Administered 2015-02-24 – 2015-02-27 (×3): 2 via ORAL
  Filled 2015-02-24 (×3): qty 2

## 2015-02-24 MED ORDER — ONDANSETRON HCL 4 MG/2ML IJ SOLN: 4.0000 mg | Freq: Three times a day (TID) | INTRAMUSCULAR | Status: DC | PRN

## 2015-02-24 MED ORDER — SIMETHICONE 80 MG PO CHEW: 80.0000 mg | CHEWABLE_TABLET | ORAL | Status: DC | PRN

## 2015-02-24 MED ORDER — SIMETHICONE 80 MG PO CHEW
80.0000 mg | CHEWABLE_TABLET | ORAL | Status: DC
  Administered 2015-02-24 – 2015-02-27 (×3): 80 mg via ORAL
  Filled 2015-02-24 (×3): qty 1

## 2015-02-24 MED ORDER — NALBUPHINE HCL 10 MG/ML IJ SOLN
5.0000 mg | INTRAMUSCULAR | Status: DC | PRN
  Filled 2015-02-24: qty 0.5

## 2015-02-24 MED ORDER — DEXTROSE 5 % IV SOLN
2.0000 g | INTRAVENOUS | Status: AC
  Administered 2015-02-24: 2 g via INTRAVENOUS
  Filled 2015-02-24: qty 2

## 2015-02-24 MED ORDER — DEXTROSE 5 % IV SOLN
2.0000 g | INTRAVENOUS | Status: DC | PRN
  Administered 2015-02-24: 2 g via INTRAVENOUS

## 2015-02-24 MED ORDER — ZOLPIDEM TARTRATE 5 MG PO TABS: 5.0000 mg | ORAL_TABLET | Freq: Every evening | ORAL | Status: DC | PRN

## 2015-02-24 MED ORDER — LACTATED RINGERS IV SOLN
INTRAVENOUS | Status: DC
  Administered 2015-02-25: 04:00:00 via INTRAVENOUS

## 2015-02-24 MED ORDER — SERTRALINE HCL 100 MG PO TABS
100.0000 mg | ORAL_TABLET | Freq: Every day | ORAL | Status: DC
  Administered 2015-02-24 – 2015-02-27 (×4): 100 mg via ORAL
  Filled 2015-02-24 (×5): qty 1

## 2015-02-24 MED ORDER — BUPIVACAINE IN DEXTROSE 0.75-8.25 % IT SOLN
INTRATHECAL | Status: DC | PRN
  Administered 2015-02-24: 1.7 mL via INTRATHECAL

## 2015-02-24 MED ORDER — NALBUPHINE HCL 10 MG/ML IJ SOLN
5.0000 mg | Freq: Once | INTRAMUSCULAR | Status: DC | PRN
  Filled 2015-02-24: qty 0.5

## 2015-02-24 MED ORDER — LIDOCAINE 5 % EX PTCH: 1.0000 | MEDICATED_PATCH | CUTANEOUS | Status: DC

## 2015-02-24 MED ORDER — CHLORHEXIDINE GLUCONATE CLOTH 2 % EX PADS
6.0000 | MEDICATED_PAD | Freq: Every day | CUTANEOUS | Status: DC
  Administered 2015-02-24: 6 via TOPICAL

## 2015-02-24 MED ORDER — NALOXONE HCL 1 MG/ML IJ SOLN: 1.0000 ug/kg/h | INTRAVENOUS | Status: DC | PRN

## 2015-02-24 MED ORDER — DIPHENHYDRAMINE HCL 25 MG PO CAPS
25.0000 mg | ORAL_CAPSULE | ORAL | Status: DC | PRN
  Filled 2015-02-24: qty 1

## 2015-02-24 MED ORDER — LIDOCAINE 5 % EX PTCH
1.0000 | MEDICATED_PATCH | CUTANEOUS | Status: DC
  Filled 2015-02-24: qty 1

## 2015-02-24 MED ORDER — TETANUS-DIPHTH-ACELL PERTUSSIS 5-2.5-18.5 LF-MCG/0.5 IM SUSP: 0.5000 mL | Freq: Once | INTRAMUSCULAR | Status: DC

## 2015-02-24 MED ORDER — LANOLIN HYDROUS EX OINT: 1.0000 "application " | TOPICAL_OINTMENT | CUTANEOUS | Status: DC | PRN

## 2015-02-24 MED ORDER — MORPHINE SULFATE (PF) 0.5 MG/ML IJ SOLN
INTRAMUSCULAR | Status: DC | PRN
  Administered 2015-02-24: .2 mg via EPIDURAL

## 2015-02-24 MED ORDER — WITCH HAZEL-GLYCERIN EX PADS: 1.0000 "application " | MEDICATED_PAD | CUTANEOUS | Status: DC | PRN

## 2015-02-24 MED ORDER — LIDOCAINE 5 % EX PTCH
MEDICATED_PATCH | CUTANEOUS | Status: DC | PRN
  Administered 2015-02-24: 1 via TRANSDERMAL

## 2015-02-24 MED ORDER — ACETAMINOPHEN 325 MG PO TABS: 650.0000 mg | ORAL_TABLET | ORAL | Status: DC | PRN

## 2015-02-24 MED ORDER — LACTATED RINGERS IV BOLUS (SEPSIS)
1000.0000 mL | Freq: Once | INTRAVENOUS | Status: AC
  Administered 2015-02-24: 1000 mL via INTRAVENOUS

## 2015-02-24 MED ORDER — DIPHENHYDRAMINE HCL 25 MG PO CAPS: 25.0000 mg | ORAL_CAPSULE | Freq: Four times a day (QID) | ORAL | Status: DC | PRN

## 2015-02-24 MED ORDER — MAGNESIUM HYDROXIDE 400 MG/5ML PO SUSP
30.0000 mL | ORAL | Status: DC | PRN
  Filled 2015-02-24: qty 30

## 2015-02-24 MED ORDER — OXYCODONE-ACETAMINOPHEN 5-325 MG PO TABS
1.0000 | ORAL_TABLET | ORAL | Status: DC | PRN
  Administered 2015-02-25 (×2): 1 via ORAL
  Filled 2015-02-24 (×2): qty 1

## 2015-02-24 MED ORDER — OXYCODONE-ACETAMINOPHEN 5-325 MG PO TABS
2.0000 | ORAL_TABLET | ORAL | Status: DC | PRN
  Administered 2015-02-26 – 2015-02-27 (×5): 2 via ORAL
  Filled 2015-02-24 (×5): qty 2

## 2015-02-24 MED ORDER — DIPHENHYDRAMINE HCL 50 MG/ML IJ SOLN: 12.5000 mg | INTRAMUSCULAR | Status: DC | PRN

## 2015-02-24 MED ORDER — OXYTOCIN 40 UNITS IN LACTATED RINGERS INFUSION - SIMPLE MED
INTRAVENOUS | Status: AC
  Administered 2015-02-24: 62.5 mL/h via INTRAVENOUS
  Filled 2015-02-24: qty 1000

## 2015-02-24 MED ORDER — DIBUCAINE 1 % RE OINT: 1.0000 "application " | TOPICAL_OINTMENT | RECTAL | Status: DC | PRN

## 2015-02-24 MED ORDER — CITRIC ACID-SODIUM CITRATE 334-500 MG/5ML PO SOLN
30.0000 mL | Freq: Once | ORAL | Status: AC
  Administered 2015-02-24: 30 mL via ORAL
  Filled 2015-02-24: qty 30

## 2015-02-24 MED ORDER — SODIUM CHLORIDE 0.9 % IJ SOLN: 3.0000 mL | INTRAMUSCULAR | Status: DC | PRN

## 2015-02-24 MED ORDER — FENTANYL CITRATE (PF) 100 MCG/2ML IJ SOLN: 25.0000 ug | INTRAMUSCULAR | Status: DC | PRN

## 2015-02-24 MED ORDER — DIPHENHYDRAMINE HCL 25 MG PO CAPS
25.0000 mg | ORAL_CAPSULE | Freq: Four times a day (QID) | ORAL | Status: DC | PRN
  Administered 2015-02-24: 25 mg via ORAL

## 2015-02-24 MED ORDER — PRENATAL MULTIVITAMIN CH
1.0000 | ORAL_TABLET | Freq: Every day | ORAL | Status: DC
  Administered 2015-02-24 – 2015-02-27 (×4): 1 via ORAL
  Filled 2015-02-24 (×5): qty 1

## 2015-02-24 MED ORDER — MENTHOL 3 MG MT LOZG: 1.0000 | LOZENGE | OROMUCOSAL | Status: DC | PRN

## 2015-02-24 MED ORDER — OXYTOCIN 40 UNITS IN LACTATED RINGERS INFUSION - SIMPLE MED
INTRAVENOUS | Status: DC | PRN
  Administered 2015-02-24: 1000 mL via INTRAVENOUS

## 2015-02-24 MED ORDER — IBUPROFEN 600 MG PO TABS: 600.0000 mg | ORAL_TABLET | Freq: Four times a day (QID) | ORAL | Status: DC | PRN

## 2015-02-24 MED ORDER — NALOXONE HCL 0.4 MG/ML IJ SOLN: 0.4000 mg | INTRAMUSCULAR | Status: DC | PRN

## 2015-02-24 MED ORDER — ONDANSETRON HCL 4 MG/2ML IJ SOLN: 4.0000 mg | Freq: Once | INTRAMUSCULAR | Status: DC | PRN

## 2015-02-24 SURGICAL SUPPLY — 25 items
BAG COUNTER SPONGE EZ (MISCELLANEOUS) ×2 IMPLANT
CANISTER SUCT 3000ML (MISCELLANEOUS) ×3 IMPLANT
CHLORAPREP W/TINT 26ML (MISCELLANEOUS) ×6 IMPLANT
COUNTER SPONGE BAG EZ (MISCELLANEOUS) ×1
DRSG TELFA 3X8 NADH (GAUZE/BANDAGES/DRESSINGS) ×3 IMPLANT
GAUZE SPONGE 4X4 12PLY STRL (GAUZE/BANDAGES/DRESSINGS) ×3 IMPLANT
GLOVE BIO SURGEON STRL SZ 6 (GLOVE) ×3 IMPLANT
GLOVE BIOGEL PI IND STRL 6.5 (GLOVE) ×1 IMPLANT
GLOVE BIOGEL PI INDICATOR 6.5 (GLOVE) ×2
GOWN STRL REUS W/ TWL LRG LVL3 (GOWN DISPOSABLE) ×2 IMPLANT
GOWN STRL REUS W/TWL LRG LVL3 (GOWN DISPOSABLE) ×4
KIT RM TURNOVER STRD PROC AR (KITS) ×3 IMPLANT
MARKER SKIN W/RULER 31145785 (MISCELLANEOUS) ×3 IMPLANT
NS IRRIG 1000ML POUR BTL (IV SOLUTION) ×3 IMPLANT
PACK C SECTION AR (MISCELLANEOUS) ×3 IMPLANT
PAD GROUND ADULT SPLIT (MISCELLANEOUS) ×3 IMPLANT
PAD OB MATERNITY 4.3X12.25 (PERSONAL CARE ITEMS) ×3 IMPLANT
PAD PREP 24X41 OB/GYN DISP (PERSONAL CARE ITEMS) ×3 IMPLANT
SUT CHROMIC 0 CT 1 (SUTURE) IMPLANT
SUT MNCRL AB 4-0 PS2 18 (SUTURE) ×3 IMPLANT
SUT PLAIN 2 0 XLH (SUTURE) IMPLANT
SUT VIC AB 0 CT1 36 (SUTURE) ×6 IMPLANT
SUT VIC AB 1 CT1 36 (SUTURE) ×6 IMPLANT
SUT VIC AB 3-0 SH 27 (SUTURE) ×2
SUT VIC AB 3-0 SH 27X BRD (SUTURE) ×1 IMPLANT

## 2015-02-24 NOTE — H&P (Signed)
Obstetric Preoperative History and Physical  Christine Arellano is a 24 y.o. G3P1011 with IUP at [redacted]w[redacted]d presenting for scheduled repeat cesarean section. History of prior C-section x 1, declined TOLAC. No acute concerns.   Prenatal Course Source of Care: [facility] with onset of care at 11 weeks Pregnancy complications or risks: Patient Active Problem List   Diagnosis Date Noted  . Depression affecting pregnancy in third trimester, antepartum 01/16/2015  . History of cesarean delivery affecting pregnancy 01/16/2015   She plans to breastfeed She is undecided  for postpartum contraception, but declines permanent sterilization.   Prenatal labs and studies: ABO, Rh: --/--/A POS (09/27 1039) Antibody: NEG (09/27 1038) Rubella: Immune (02/23 0000) RPR: Non Reactive (09/27 1100)  HBsAg: Negative (02/23 0000)  HIV: Non-reactive (02/23 0000)  GBS: negative 1 hr Glucola  normal Genetic screening normal Anatomy US normal  Prenatal Transfer Tool  Maternal Diabetes: No Genetic Screening: Normal Maternal Ultrasounds/Referrals: Normal Fetal Ultrasounds or other Referrals:  None Maternal Substance Abuse:  No Significant Maternal Medications:  Meds include: Zoloft Significant Maternal Lab Results: None  Past Medical History  Diagnosis Date  . Insomnia   . Depression   . GERD (gastroesophageal reflux disease)     Past Surgical History  Procedure Laterality Date  . Hemorroidectomy    . Tonsillectomy    . Cesarean section      OB History  Gravida Para Term Preterm AB SAB TAB Ectopic Multiple Living  3 1 1  1 1    1     # Outcome Date GA Lbr Len/2nd Weight Sex Delivery Anes PTL Lv  3 Current           2 SAB 02/2014          1 Term 04/21/13    M CS-Unspec   Y     Complications: Fetal Intolerance      Social History   Social History  . Marital Status: Single    Spouse Name: N/A  . Number of Children: N/A  . Years of Education: N/A   Social History Main Topics  .  Smoking status: Former Research scientist (life sciences)  . Smokeless tobacco: Never Used  . Alcohol Use: No  . Drug Use: No  . Sexual Activity: Yes    Birth Control/ Protection: None   Other Topics Concern  . None   Social History Narrative    Family History  Problem Relation Age of Onset  . Depression Father   . Cancer Paternal Aunt     breast  . Heart disease Paternal Uncle     Prescriptions prior to admission  Medication Sig Dispense Refill Last Dose  . Biotin 5000 MCG CAPS Take by mouth.   02/24/2015  . Prenatal Vit-Fe Fumarate-FA (PRENATAL MULTIVITAMIN) TABS tablet Take 1 tablet by mouth daily at 12 noon.   02/24/2015  . sertraline (ZOLOFT) 100 MG tablet Take 1 tablet (100 mg total) by mouth daily. 30 tablet 2 02/23/2015  . diphenhydramine-acetaminophen (TYLENOL PM) 25-500 MG TABS Take 1 tablet by mouth at bedtime as needed.   Taking  . docusate sodium (COLACE) 100 MG capsule Take 1 capsule (100 mg total) by mouth daily. 100 capsule 2 Taking  . HYDROcodone-acetaminophen (NORCO) 10-325 MG per tablet Take 1 tablet by mouth every 6 (six) hours as needed. 30 tablet 0   . ranitidine (ZANTAC) 150 MG tablet Take 1 tablet (150 mg total) by mouth 2 (two) times daily. 60 tablet 7 Taking  . terconazole (TERAZOL  7) 0.4 % vaginal cream Place 1 applicator vaginally at bedtime. (Patient not taking: Reported on 02/16/2015) 45 g 0 Not Taking    No Known Allergies  Review of Systems: Negative except for what is mentioned in HPI.  Physical Exam: BP 111/64 mmHg  Pulse 74  Temp(Src) 98.4 F (36.9 C) (Oral)  Resp 16  Ht 5\' 2"  (1.575 m)  Wt 174 lb (78.926 kg)  BMI 31.82 kg/m2  LMP 05/26/2014 (LMP Unknown) FHR by Doppler: 143 bpm GENERAL: Well-developed, well-nourished female in no acute distress.  LUNGS: Clear to auscultation bilaterally.  HEART: Regular rate and rhythm. ABDOMEN: Soft, nontender, nondistended, gravid, well-healed Pfannenstiel incision. PELVIC: Deferred EXTREMITIES: Nontender, no edema, 2+  distal pulses.   Pertinent Labs/Studies:   Results for orders placed or performed during the hospital encounter of 02/23/15 (from the past 72 hour(s))  Type and screen     Status: None   Collection Time: 02/23/15 10:38 AM  Result Value Ref Range   ABO/RH(D) A POS    Antibody Screen NEG    Sample Expiration 02/26/2015   ABO/Rh     Status: None   Collection Time: 02/23/15 10:39 AM  Result Value Ref Range   ABO/RH(D) A POS   CBC     Status: Abnormal   Collection Time: 02/23/15 11:00 AM  Result Value Ref Range   WBC 8.9 3.6 - 11.0 K/uL   RBC 4.26 3.80 - 5.20 MIL/uL   Hemoglobin 11.8 (L) 12.0 - 16.0 g/dL   HCT 36.4 35.0 - 47.0 %   MCV 85.5 80.0 - 100.0 fL   MCH 27.7 26.0 - 34.0 pg   MCHC 32.3 32.0 - 36.0 g/dL   RDW 14.6 (H) 11.5 - 14.5 %   Platelets 219 150 - 440 K/uL  RPR     Status: None   Collection Time: 02/23/15 11:00 AM  Result Value Ref Range   RPR Ser Ql Non Reactive Non Reactive    Comment: (NOTE) Performed At: [facility] [address] [place], [place] [id number] Lindon Romp MD SV:[id number]     Assessment and Plan :Christine Arellano is a 24 y.o. G3P1011 at 103w1d who is scheduled cesarean section delivery at [redacted] weeks gestation on 9/272016 . The patient is understanding of the planned procedure and is aware of and accepting of all surgical risks, including but not limited to: bleeding which may require transfusion or reoperation; infection which may require antibiotics; injury to bowel, bladder, ureters or other surrounding organs which may require repair; injury to the fetus; need for additional procedures including hysterectomy in the event of life-threatening complications; placental abnormalities wth subsequent pregnancies; incisional problems; blood clot disorders which may require blood thinners;, and other postoperative/anesthesia complications. The patient is in agreement with the proposed plan, and gives informed written consent for the procedure. All  questions have been answered.   Rubie Maid, MD [facility]

## 2015-02-24 NOTE — OR Nursing (Signed)
FHR - 146; foley inserted

## 2015-02-24 NOTE — Transfer of Care (Signed)
Immediate Anesthesia Transfer of Care Note  Patient: Christine Arellano  Procedure(s) Performed: Procedure(s): CESAREAN SECTION (N/A)  Patient Location: PACU  Anesthesia Type:Spinal  Level of Consciousness: awake  Airway & Oxygen Therapy: Patient Spontanous Breathing  Post-op Assessment: Report given to RN and Post -op Vital signs reviewed and stable  Post vital signs: Reviewed and stable  Last Vitals:  Filed Vitals:   02/24/15 1054  BP: 111/64  Pulse: 74  Temp: 36.9 C  Resp: 16    Complications: No apparent anesthesia complications

## 2015-02-24 NOTE — Anesthesia Preprocedure Evaluation (Addendum)
Anesthesia Evaluation  Patient identified by MRN, date of birth, ID band Patient awake    Reviewed: Allergy & Precautions, NPO status , Patient's Chart, lab work & pertinent test results  Airway Mallampati: II       Dental no notable dental hx.    Pulmonary former smoker,    Pulmonary exam normal        Cardiovascular negative cardio ROS Normal cardiovascular exam     Neuro/Psych    GI/Hepatic Neg liver ROS, GERD  ,  Endo/Other  negative endocrine ROS  Renal/GU negative Renal ROS  negative genitourinary   Musculoskeletal negative musculoskeletal ROS (+)   Abdominal Normal abdominal exam  (+)   Peds negative pediatric ROS (+)  Hematology negative hematology ROS (+)   Anesthesia Other Findings   Reproductive/Obstetrics                            Anesthesia Physical Anesthesia Plan  ASA: II  Anesthesia Plan: Spinal   Post-op Pain Management:    Induction:   Airway Management Planned: Nasal Cannula  Additional Equipment:   Intra-op Plan:   Post-operative Plan:   Informed Consent: I have reviewed the patients History and Physical, chart, labs and discussed the procedure including the risks, benefits and alternatives for the proposed anesthesia with the patient or authorized representative who has indicated his/her understanding and acceptance.     Plan Discussed with: CRNA  Anesthesia Plan Comments:         Anesthesia Quick Evaluation

## 2015-02-24 NOTE — Anesthesia Procedure Notes (Signed)
Spinal Patient location during procedure: OR Start time: 02/24/2015 12:20 PM End time: 02/24/2015 12:27 PM Reason for block: at surgeon's request Staffing Anesthesiologist: Marline Backbone F Performed by: anesthesiologist  Preanesthetic Checklist Completed: patient identified, site marked, surgical consent, pre-op evaluation, timeout performed, IV checked, risks and benefits discussed, monitors and equipment checked and at surgeon's request Spinal Block Patient position: sitting Prep: Betadine Patient monitoring: heart rate and blood pressure Approach: midline Location: L3-4 Injection technique: single-shot Needle Needle type: Quincke  Needle gauge: 25 G Needle length: 9 cm Needle insertion depth: 5 cm Assessment Sensory level: T6

## 2015-02-24 NOTE — Op Note (Signed)
Cesarean Section Procedure Note  Indications:  24 y.o. G3P2011 at [redacted]w[redacted]d with history of previous uterine incision low transverse, declines TOLAC.  Pre-operative Diagnosis: 39 week 1 day pregnancy, h/o prior C-section x 1.  Post-operative Diagnosis: same  Surgeon: Rubie Maid, MD  Assistants: None  Procedure: Repeat low transverse Cesarean Section  Anesthesia: Spinal anesthesia  ASA Class: II  Procedure Details: The patient was seen in the Holding Room. The risks, benefits, complications, treatment options, and expected outcomes were discussed with the patient.  The patient concurred with the proposed plan, giving informed consent.  The site of surgery properly noted/marked. The patient was taken to the Operating Room, identified as Christine Arellano and the procedure verified as C-Section Delivery. A Time Out was held and the above information confirmed.  After induction of anesthesia, the patient was draped and prepped in the usual sterile manner. Anesthesia was tested and noted to be adequate. A Pfannenstiel incision was made and carried down through the subcutaneous tissue to the fascia. Fascial incision was made and extended transversely. The fascia was separated from the underlying rectus tissue superiorly and inferiorly. The peritoneum was identified and entered. Peritoneal incision was extended longitudinally. The utero-vesical peritoneal reflection was incised transversely and the bladder flap was bluntly freed from the lower uterine segment. A low transverse uterine incision was made. Delivered from cephalic presentation was a 3109 gram female with Apgar scores of 9 at one minute and 9 at five minutes.After the umbilical cord was clamped and cut cord blood was obtained for evaluation. The placenta was removed intact and appeared normal. The uterus was exteriorized and cleared of all clots and debris. The uterine outline, tubes and ovaries appeared normal.  The uterine incision was closed  with running locked sutures of 0-Vicryl.  A second suture of 0-Vicryl was used in an imbricating layer.  Hemostasis was observed. Lavage was carried out until clear. The fascia was then reapproximated with a running suture of 1-0 Vicryl. The subcutaneous fat layer was reapproximated with 3-0 Vicryl. skin was reapproximated with 4-0 Monocryl.  Instrument, sponge, and needle counts were correct prior the abdominal closure and at the conclusion of the case.   Findings: Female infant, cephalic presentation, 1914 grams, with Apgar scores of 9 at one minute and 9 at five minutes. Intact placenta with 3 vessel cord.  The uterine outline, tubes and ovaries appeared normal.   Estimated Blood Loss:  700 ml      Drains: foley catheter to gravity drainage, 100 clear urine at end of the procedure         Total IV Fluids:  1200 ml  Specimens: Placenta and Disposition:  Sent to Pathology         Implants: None         Complications:  None; patient tolerated the procedure well.         Disposition: PACU - hemodynamically stable.         Condition: stable   Rubie Maid, MD [facility] 02/24/2015 1:30 PM

## 2015-02-25 LAB — CBC
HCT: 29.7 % — ABNORMAL LOW (ref 35.0–47.0)
Hemoglobin: 9.8 g/dL — ABNORMAL LOW (ref 12.0–16.0)
MCH: 27.9 pg (ref 26.0–34.0)
MCHC: 32.9 g/dL (ref 32.0–36.0)
MCV: 84.8 fL (ref 80.0–100.0)
Platelets: 163 10*3/uL (ref 150–440)
RBC: 3.51 MIL/uL — ABNORMAL LOW (ref 3.80–5.20)
RDW: 15 % — ABNORMAL HIGH (ref 11.5–14.5)
WBC: 8.8 10*3/uL (ref 3.6–11.0)

## 2015-02-25 MED ORDER — FERROUS SULFATE 325 (65 FE) MG PO TABS
325.0000 mg | ORAL_TABLET | Freq: Every day | ORAL | Status: DC
  Administered 2015-02-25 – 2015-02-27 (×3): 325 mg via ORAL
  Filled 2015-02-25 (×3): qty 1

## 2015-02-25 NOTE — Anesthesia Post-op Follow-up Note (Signed)
  Anesthesia Pain Follow-up Note  Patient: Christine Arellano  Day #1  Date of Follow-up: 02/25/2015 Time: 7:32 AM  Last Vitals:  Filed Vitals:   02/25/15 0415  BP: 111/60  Pulse: 67  Temp: 36.4 C  Resp: 18    Level of Consciousness: alert  Pain: mild   Side Effects:None  Catheter Site Exam:clean  Plan: D/C from anesthesia care  Brantley Fling

## 2015-02-25 NOTE — Anesthesia Postprocedure Evaluation (Signed)
  Anesthesia Post-op Note  Patient: Christine Arellano  Procedure(s) Performed: Procedure(s): CESAREAN SECTION (N/A)  Anesthesia type:Spinal  Patient location: PACU  Post pain: Pain level controlled  Post assessment: Post-op Vital signs reviewed, Patient's Cardiovascular Status Stable, Respiratory Function Stable, Patent Airway and No signs of Nausea or vomiting  Post vital signs: Reviewed and stable  Last Vitals:  Filed Vitals:   02/25/15 0415  BP: 111/60  Pulse: 67  Temp: 36.4 C  Resp: 18    Level of consciousness: awake, alert  and patient cooperative  Complications: No apparent anesthesia complications

## 2015-02-25 NOTE — Progress Notes (Signed)
Postpartum Day 1: Cesarean Delivery  Subjective: Patient reports mild incisional pain and tolerating PO. Voiding without difficulty.  Has not passed flatus or passed BM.    Objective: Vital signs in last 24 hours: Temp:  [97 F (36.1 C)-98.4 F (36.9 C)] 97.5 F (36.4 C) (09/29 0415) Pulse Rate:  [50-107] 67 (09/29 0415) Resp:  [0-18] 18 (09/29 0415) BP: (94-129)/(50-78) 111/60 mmHg (09/29 0415) SpO2:  [98 %-100 %] 98 % (09/29 0415) Weight:  [174 lb (78.926 kg)] 174 lb (78.926 kg) (09/28 1054)  Physical Exam:  General: alert and no distress Lochia: appropriate Uterine Fundus: firm Incision: healing well, no significant drainage, no dehiscence, no significant erythema DVT Evaluation: Negative Homan's sign. No cords or calf tenderness. No significant calf/ankle edema.   Recent Labs  02/23/15 1100 02/25/15 0549  HGB 11.8* 9.8*  HCT 36.4 29.7*    Assessment/Plan: Status post Cesarean section. Doing well postoperatively.  Continue current care. Advance to regular diet.  IVF d/c'd, tolerating diet.  Encourage ambulation.  Ferrous sulfate daily for mild anemia due to surgical blood loss.  Patient asymptomatic.  Desires circumcision for female infant.   Rubie Maid 02/25/2015, 7:54 AM

## 2015-02-26 MED ORDER — OXYCODONE-ACETAMINOPHEN 5-325 MG PO TABS: 1.0000 | ORAL_TABLET | Freq: Four times a day (QID) | ORAL | Status: DC | PRN

## 2015-02-26 MED ORDER — IBUPROFEN 800 MG PO TABS: 800.0000 mg | ORAL_TABLET | Freq: Three times a day (TID) | ORAL | Status: DC | PRN

## 2015-02-26 MED ORDER — DOCUSATE SODIUM 100 MG PO CAPS: 100.0000 mg | ORAL_CAPSULE | Freq: Two times a day (BID) | ORAL | Status: DC | PRN

## 2015-02-26 NOTE — Progress Notes (Addendum)
Postpartum Day 2: Cesarean Delivery  Subjective: Patient reports denies incisional pain, tolerating PO, + flatus, + BM and no problems voiding. Ambulating without difficulty.    Objective: Vital signs in last 24 hours: Temp:  [97.4 F (36.3 C)-98.7 F (37.1 C)] 98.7 F (37.1 C) (09/30 0814) Pulse Rate:  [71-81] 71 (09/30 0814) Resp:  [18-20] 18 (09/30 0814) BP: (107-110)/(60-63) 110/63 mmHg (09/30 0814) SpO2:  [100 %] 100 % (09/30 0814)  Physical Exam:  General: alert and no distress Lochia: appropriate Uterine Fundus: firm Incision: healing well, no significant drainage, no dehiscence, no significant erythema DVT Evaluation: Negative Homan's sign. No cords or calf tenderness. No significant calf/ankle edema.  CBC Latest Ref Rng 02/25/2015 02/23/2015 12/03/2014  WBC 3.6 - 11.0 K/uL 8.8 8.9 -  Hemoglobin 12.0 - 16.0 g/dL 9.8(L) 11.8(L) -  Hematocrit 35.0 - 47.0 % 29.7(L) 36.4 38.0  Platelets 150 - 440 K/uL 163 219 -    Assessment/Plan: Status post Cesarean section. Doing well postoperatively.  Continue current care. Regular diet. Continue ambulation.  Ferrous sulfate daily for mild anemia due to surgical blood loss.  Patient asymptomatic.  Desires circumcision for female infant. To be performed outpatient.  Possibly d/c home later today, otherwise can d/c home in a.m.   Christine Arellano 02/26/2015, 1:13 PM

## 2015-02-26 NOTE — Discharge Instructions (Signed)

## 2015-02-27 NOTE — Progress Notes (Signed)
Postpartum Day 3: Cesarean Delivery  Subjective: Patient reports denies incisional pain, tolerating PO, + flatus, + BM and no problems voiding. Ambulating without difficulty.    Objective: Vital signs in last 24 hours: Temp:  [97.7 F (36.5 C)-98.4 F (36.9 C)] 97.7 F (36.5 C) (10/01 0805) Pulse Rate:  [69-70] 70 (10/01 0805) Resp:  [18] 18 (10/01 0805) BP: (101-105)/(52-64) 105/64 mmHg (10/01 0805) SpO2:  [98 %] 98 % (10/01 0805)  Physical Exam:  General: alert and no distress Lochia: appropriate Uterine Fundus: firm Incision: healing well, no significant drainage, no dehiscence, no significant erythema DVT Evaluation: Negative Homan's sign. No cords or calf tenderness. No significant calf/ankle edema.  CBC Latest Ref Rng 02/25/2015 02/23/2015 12/03/2014  WBC 3.6 - 11.0 K/uL 8.8 8.9 -  Hemoglobin 12.0 - 16.0 g/dL 9.8(L) 11.8(L) -  Hematocrit 35.0 - 47.0 % 29.7(L) 36.4 38.0  Platelets 150 - 440 K/uL 163 219 -    Assessment/Plan: Status post Cesarean section. Doing well postoperatively.  Continue current care. Ferrous sulfate daily for mild anemia due to surgical blood loss.  Patient asymptomatic.  Desires circumcision for female infant. To be performed outpatient.  D/c home today.    Rubie Maid 02/27/2015, 8:53 AM

## 2015-02-27 NOTE — Progress Notes (Signed)
D/C order from MD.  Reviewed d/c instructions and prescriptions with patient and answered any questions.  Patient d/c home with infant via wheelchair by nursing/auxillary. 

## 2015-02-27 NOTE — Discharge Summary (Signed)
Obstetric Discharge Summary Reason for Admission: h/o cesarean section x 1, declines TOLAC Prenatal Procedures: ultrasound Intrapartum Procedures: cesarean: low cervical, transverse Postpartum Procedures: none Complications-Operative and Postpartum: none HEMOGLOBIN  Date Value Ref Range Status  02/25/2015 9.8* 12.0 - 16.0 g/dL Final  07/21/2014 13.8 g/dL Final   HGB  Date Value Ref Range Status  04/21/2013 11.9* 12.0-16.0 g/dL Final   HCT  Date Value Ref Range Status  02/25/2015 29.7* 35.0 - 47.0 % Final  07/21/2014 41 % Final  04/22/2013 28.1* 35.0-47.0 % Final   HEMATOCRIT  Date Value Ref Range Status  12/03/2014 38.0 34.0 - 46.6 % Final    Physical Exam:  General: alert and no distress Lochia: appropriate Uterine Fundus: firm Incision: healing well, no significant drainage, no dehiscence, no significant erythema DVT Evaluation: No evidence of DVT seen on physical exam. No cords or calf tenderness. No significant calf/ankle edema.  Discharge Diagnoses: Term Pregnancy-delivered  Discharge Information: Date: 02/27/2015 Activity: pelvic rest Diet: routine Medications: PNV, Ibuprofen, Colace, Iron and Percocet Condition: stable Instructions: refer to practice specific booklet Discharge to: home   Newborn Data: Live born female  Birth Weight: 6 lb 10.5 oz (3019 g) APGAR: 9, 9  Home with mother.  Rubie Maid 02/27/2015, 8:57 AM

## 2015-03-03 ENCOUNTER — Ambulatory Visit: Payer: Medicaid Other | Admitting: Obstetrics and Gynecology

## 2015-03-03 ENCOUNTER — Ambulatory Visit (INDEPENDENT_AMBULATORY_CARE_PROVIDER_SITE_OTHER): Payer: Medicaid Other | Admitting: Obstetrics and Gynecology

## 2015-03-03 ENCOUNTER — Encounter: Payer: Self-pay | Admitting: Obstetrics and Gynecology

## 2015-03-03 VITALS — BP 106/48 | HR 69 | Wt 154.8 lb

## 2015-03-03 DIAGNOSIS — Z98891 History of uterine scar from previous surgery: Secondary | ICD-10-CM

## 2015-03-03 NOTE — Progress Notes (Signed)
  Subjective:     Christine Arellano is a 24 y.o. female who presents to the clinic 1 weeks status post repeat LTCS for pregnancy. Eating a regular diet without difficulty. Bowel movements are normal. Pain is controlled with current analgesics. Medications being used: ibuprofen (OTC).  The following portions of the patient's history were reviewed and updated as appropriate: allergies, current medications, past family history, past medical history, past social history, past surgical history and problem list.  Review of Systems Pertinent items noted in HPI and remainder of comprehensive ROS otherwise negative.    Objective:    BP 106/48 mmHg  Pulse 69  Wt 154 lb 12.8 oz (70.217 kg)  LMP 05/26/2014 (LMP Unknown)  Breastfeeding? Yes General:  alert, cooperative and appears stated age  Abdomen: soft, bowel sounds active, non-tender  Incision:   healing well, no drainage, no erythema, no hernia, no seroma, no swelling, well approximated, no dehiscence, incision well approximated     Assessment:    Doing well postoperatively. Operative findings again reviewed. Pathology report discussed.    Plan:    1. Continue any current medications. 2. Wound care discussed. 3. Activity restrictions: no lifting more than 20 pounds 4. Anticipated return to work: not applicable. 5. Follow up: 5 weeks for routine PPV.

## 2015-03-03 NOTE — Patient Instructions (Signed)
  Place gynecology postoperative visit patient instructions here.

## 2015-04-07 ENCOUNTER — Encounter: Payer: Self-pay | Admitting: Obstetrics and Gynecology

## 2015-04-07 ENCOUNTER — Ambulatory Visit: Payer: Medicaid Other | Admitting: Obstetrics and Gynecology

## 2015-04-07 ENCOUNTER — Ambulatory Visit (INDEPENDENT_AMBULATORY_CARE_PROVIDER_SITE_OTHER): Payer: Medicaid Other | Admitting: Obstetrics and Gynecology

## 2015-04-07 VITALS — BP 107/63 | HR 74 | Ht 62.0 in | Wt 163.9 lb

## 2015-04-07 DIAGNOSIS — K5909 Other constipation: Secondary | ICD-10-CM

## 2015-04-07 DIAGNOSIS — D509 Iron deficiency anemia, unspecified: Secondary | ICD-10-CM

## 2015-04-07 DIAGNOSIS — Z98891 History of uterine scar from previous surgery: Secondary | ICD-10-CM

## 2015-04-07 MED ORDER — DROSPIRENONE-ETHINYL ESTRADIOL 3-0.02 MG PO TABS: 1.0000 | ORAL_TABLET | Freq: Every day | ORAL | Status: DC

## 2015-04-07 NOTE — Progress Notes (Signed)
POSTPARTUM CLINIC PROGRESS NOTE   Subjective:     Christine Arellano is a 24 y.o. [phone] female who presents for a postpartum visit. She is 6 weeks postpartum following a repeat low cervical transverse Cesarean section. I have fully reviewed the prenatal and intrapartum course. The delivery was at 64 gestational weeks.  Anesthesia: spinal. Postpartum course has been well. Baby's course has been well. Baby is feeding by breast. Bleeding staining only. Bowel function is normal. Bladder function is abnormal: mild constipation despite use of Colace. Patient is not sexually active. Contraception method is OCP (estrogen/progesterone). Postpartum depression screening: negative.  The following portions of the patient's history were reviewed and updated as appropriate: allergies, current medications, past family history, past medical history, past social history, past surgical history and problem list.  Review of Systems Gastrointestinal: hemorrhoids   Objective:    BP 107/63 mmHg  Pulse 74  Ht 5\' 2"  (1.575 m)  Wt 163 lb 14.4 oz (74.345 kg)  BMI 29.97 kg/m2  Breastfeeding? Yes  General:  alert and no distress   Breasts:  inspection negative, no nipple discharge or bleeding, no masses or nodularity palpable  Lungs: clear to auscultation bilaterally  Heart:  regular rate and rhythm, S1, S2 normal, no murmur, click, rub or gallop  Abdomen: soft, non-tender; bowel sounds normal; no masses,  no organomegaly.  Well healed Pfannenstiel incision scar   Vulva:  normal  Vagina: normal vagina, no discharge, exudate, lesion, or erythema  Cervix:  no lesions and nulliparous appearance  Corpus: normal size, contour, position, consistency, mobility, non-tender  Adnexa:  normal adnexa  Rectal Exam: normal sphincter and external hemorrhoids         Lab Results  Component Value Date   HGB 9.8* 02/25/2015    Assessment:   Routine postpartum exam. Pap smear not done at today's visit.    Constipation Hemorrhoids Anemia, mild  Plan:   1. Contraception: OCP (estrogen/progesterone).  Prescribed Gianvi (patient notes h/o acne, this is anti-androgenic) 2. Hgb ordered 3. Discussed use of Miralax, increasing fiber and fluid intake, and Sitz baths.  Patient already using cream (cannot recall name, but likely Anusol) as needed. Has h/o thrombosed hemorrhoid in the past.  Advised to f/u if symptoms worsen or fail to improve. 4. Follow up in: 3 months for annual exam as needed.     Rubie Maid, MD [facility]

## 2015-05-03 ENCOUNTER — Encounter: Payer: Self-pay | Admitting: Internal Medicine

## 2015-05-03 DIAGNOSIS — F329 Major depressive disorder, single episode, unspecified: Secondary | ICD-10-CM | POA: Insufficient documentation

## 2015-05-03 DIAGNOSIS — L709 Acne, unspecified: Secondary | ICD-10-CM | POA: Insufficient documentation

## 2015-05-03 DIAGNOSIS — F32A Depression, unspecified: Secondary | ICD-10-CM | POA: Insufficient documentation

## 2015-05-04 ENCOUNTER — Ambulatory Visit (INDEPENDENT_AMBULATORY_CARE_PROVIDER_SITE_OTHER): Payer: Medicaid Other | Admitting: Internal Medicine

## 2015-05-04 ENCOUNTER — Encounter: Payer: Self-pay | Admitting: Internal Medicine

## 2015-05-04 VITALS — BP 110/60 | HR 84 | Temp 97.8°F | Ht 62.0 in | Wt 168.2 lb

## 2015-05-04 DIAGNOSIS — H109 Unspecified conjunctivitis: Secondary | ICD-10-CM | POA: Diagnosis not present

## 2015-05-04 DIAGNOSIS — J40 Bronchitis, not specified as acute or chronic: Secondary | ICD-10-CM | POA: Diagnosis not present

## 2015-05-04 MED ORDER — AMOXICILLIN 875 MG PO TABS: 875.0000 mg | ORAL_TABLET | Freq: Two times a day (BID) | ORAL | Status: DC

## 2015-05-04 MED ORDER — ERYTHROMYCIN 5 MG/GM OP OINT: 1.0000 "application " | TOPICAL_OINTMENT | Freq: Every day | OPHTHALMIC | Status: DC

## 2015-05-04 NOTE — Progress Notes (Signed)
Date:  05/04/2015   Name:  Christine Arellano   DOB:  05-17-1991   MRN:  [id number]   Chief Complaint: Sinusitis  Patient complains of onset of chest congestion sore throat sinus drainage about a week ago. She's had low-grade fever and fatigue. She's tried over-the-counter Robitussin cough syrup and Tylenol. She has a 44-month-old and is currently breast-feeding. She is also on oral contraceptives. She's also developed a cough productive of green phlegm not associated with wheezing or significant shortness of breath.  Review of Systems  Constitutional: Positive for fever and fatigue. Negative for chills.  HENT: Positive for congestion, sinus pressure, sore throat and trouble swallowing. Negative for ear pain.   Eyes: Positive for discharge and redness. Negative for visual disturbance.  Respiratory: Positive for cough. Negative for chest tightness, shortness of breath and wheezing.   Cardiovascular: Negative for chest pain.    Patient Active Problem List   Diagnosis Date Noted  . Depression 05/03/2015  . Acne 05/03/2015  . S/P cesarean section 02/24/2015  . Depression affecting pregnancy in third trimester, antepartum 01/16/2015    Prior to Admission medications   Medication Sig Start Date End Date Taking? Authorizing Provider  Biotin 5000 MCG CAPS Take by mouth.   Yes Historical Provider, MD  docusate sodium (COLACE) 100 MG capsule Take 1 capsule (100 mg total) by mouth 2 (two) times daily as needed for mild constipation. 02/26/15  Yes Rubie Maid, MD  drospirenone-ethinyl estradiol (GIANVI) 3-0.02 MG tablet Take 1 tablet by mouth daily. 04/07/15  Yes Rubie Maid, MD  ibuprofen (ADVIL,MOTRIN) 800 MG tablet Take 1 tablet (800 mg total) by mouth every 8 (eight) hours as needed. 02/26/15  Yes Rubie Maid, MD  Prenatal Vit-Fe Fumarate-FA (PRENATAL MULTIVITAMIN) TABS tablet Take 1 tablet by mouth daily at 12 noon.   Yes Historical Provider, MD  sertraline (ZOLOFT) 100 MG tablet Take 1 tablet (100  mg total) by mouth daily. 02/05/15  Yes Melody Rockney Ghee, CNM    No Known Allergies  Past Surgical History  Procedure Laterality Date  . Hemorroidectomy    . Tonsillectomy    . Cesarean section    . Cesarean section N/A 02/24/2015    Procedure: CESAREAN SECTION;  Surgeon: Rubie Maid, MD;  Location: [facility] ORS;  Service: Obstetrics;  Laterality: N/A;    Social History  Substance Use Topics  . Smoking status: Former Research scientist (life sciences)  . Smokeless tobacco: Never Used  . Alcohol Use: No    Medication list has been reviewed and updated.   Physical Exam  Constitutional: She appears well-developed and well-nourished.  HENT:  Right Ear: Tympanic membrane and ear canal normal.  Left Ear: Tympanic membrane and ear canal normal.  Nose: Nose normal. Right sinus exhibits no maxillary sinus tenderness. Left sinus exhibits no maxillary sinus tenderness.  Mouth/Throat: Uvula is midline. Posterior oropharyngeal erythema present.  Eyes: EOM are normal. Right eye exhibits no chemosis and no discharge. Left eye exhibits chemosis and discharge. Right conjunctiva is not injected. Left conjunctiva is injected.  Neck: No tracheal tenderness present.  Cardiovascular: Normal rate, regular rhythm and normal heart sounds.   Pulmonary/Chest: Effort normal. She has decreased breath sounds.  Lymphadenopathy:    She has no cervical adenopathy.    BP 110/60 mmHg  Pulse 84  Temp(Src) 97.8 F (36.6 C)  Ht 5\' 2"  (1.575 m)  Wt 168 lb 3.2 oz (76.295 kg)  BMI 30.76 kg/m2  SpO2 100%  Breastfeeding? Yes  Assessment and Plan: 1. Bronchitis Continue  Robitussin cough syrup and sufficient fluids - amoxicillin (AMOXIL) 875 MG tablet; Take 1 tablet (875 mg total) by mouth 2 (two) times daily.  Dispense: 20 tablet; Refill: 0  2. Conjunctivitis of left eye - erythromycin ophthalmic ointment; Place 1 application into both eyes at bedtime.  Dispense: 3.5 g; Refill: 0   [name], MD [facility]  05/04/2015

## 2015-05-12 ENCOUNTER — Other Ambulatory Visit: Payer: Self-pay | Admitting: Obstetrics and Gynecology

## 2015-06-10 ENCOUNTER — Telehealth: Payer: Self-pay | Admitting: Obstetrics and Gynecology

## 2015-06-10 NOTE — Telephone Encounter (Signed)
Pt wants to know if its safe to take virtex, she is breastfeeding

## 2015-06-10 NOTE — Telephone Encounter (Signed)
Not sure what this medication is and can't find in breastfeeding and meds book??? Is there a different name for it, and what would she be taking it for?

## 2015-06-10 NOTE — Telephone Encounter (Signed)
Pt wants to know if its safe to take virtex, she is breast-feeding

## 2015-06-10 NOTE — Telephone Encounter (Signed)
HAS A COUPLE OF QUESTIONS AND WOULD LIKE A CALL BACK.

## 2015-06-15 NOTE — Telephone Encounter (Signed)
She called Friday and asked about a herbal suppliment for acne. She said noone got back with her.

## 2015-06-15 NOTE — Telephone Encounter (Signed)
Left pt detailed message about herbal medication

## 2015-07-08 ENCOUNTER — Encounter: Payer: Medicaid Other | Admitting: Obstetrics and Gynecology

## 2015-07-08 ENCOUNTER — Ambulatory Visit (INDEPENDENT_AMBULATORY_CARE_PROVIDER_SITE_OTHER): Payer: Medicaid Other | Admitting: Obstetrics and Gynecology

## 2015-07-08 ENCOUNTER — Encounter: Payer: Self-pay | Admitting: Obstetrics and Gynecology

## 2015-07-08 VITALS — BP 108/72 | HR 95 | Ht 62.0 in | Wt 178.2 lb

## 2015-07-08 DIAGNOSIS — Z01419 Encounter for gynecological examination (general) (routine) without abnormal findings: Secondary | ICD-10-CM

## 2015-07-08 DIAGNOSIS — Z0000 Encounter for general adult medical examination without abnormal findings: Secondary | ICD-10-CM | POA: Diagnosis not present

## 2015-07-08 MED ORDER — SERTRALINE HCL 50 MG PO TABS
50.0000 mg | ORAL_TABLET | Freq: Every day | ORAL | Status: DC
Start: 2015-07-08 — End: 2015-10-14

## 2015-07-08 NOTE — Patient Instructions (Signed)
  Place annual gynecologic exam patient instructions here.  Thank you for enrolling in [facility]. Please follow the instructions below to securely access your online medical record. MyChart allows you to send messages to your doctor, view your test results, manage appointments, and more.   How Do I Sign Up? 1. In your Internet browser, go to [employer] and enter [email or web address]. 2. Click on the Sign Up Now link in the Sign In box. You will see the New Member Sign Up page. 3. Enter your MyChart Access Code exactly as it appears below. You will not need to use this code after you've completed the sign-up process. If you do not sign up before the expiration date, you must request a new code.  MyChart Access Code: [email or web address] Expires: 09/05/2015 10:56 AM  4. Enter your Social Security Number ([id number]) and Date of Birth (mm/dd/yyyy) as indicated and click Submit. You will be taken to the next sign-up page. 5. Create a MyChart ID. This will be your MyChart login ID and cannot be changed, so think of one that is secure and easy to remember. 6. Create a MyChart password. You can change your password at any time. 7. Enter your Password Reset Question and Answer. This can be used at a later time if you forget your password.  8. Enter your e-mail address. You will receive e-mail notification when new information is available in [facility]. 9. Click Sign Up. You can now view your medical record.   Additional Information Remember, MyChart is NOT to be used for urgent needs. For medical emergencies, dial 911.

## 2015-07-08 NOTE — Progress Notes (Signed)
  Subjective:     Christine Arellano is a 25 y.o. female and is here for a comprehensive physical exam. The patient reports no problems.  Social History   Social History  . Marital Status: Single    Spouse Name: N/A  . Number of Children: N/A  . Years of Education: N/A   Occupational History  . Not on file.   Social History Main Topics  . Smoking status: Former Research scientist (life sciences)  . Smokeless tobacco: Never Used  . Alcohol Use: No  . Drug Use: No  . Sexual Activity: Yes    Birth Control/ Protection: None   Other Topics Concern  . Not on file   Social History Narrative   Health Maintenance  Topic Date Due  . PAP SMEAR  09/12/2009  . INFLUENZA VACCINE  12/28/2015  . TETANUS/TDAP  12/02/2024  . HIV Screening  Completed    The following portions of the patient's history were reviewed and updated as appropriate: allergies, current medications, past family history, past medical history, past social history, past surgical history and problem list.  Review of Systems A comprehensive review of systems was negative.   Objective:    General appearance: alert, cooperative, appears stated age and mildly obese Neck: no adenopathy, no carotid bruit, no JVD, supple, symmetrical, trachea midline and thyroid not enlarged, symmetric, no tenderness/mass/nodules Lungs: clear to auscultation bilaterally Breasts: normal appearance, no masses or tenderness Heart: regular rate and rhythm, S1, S2 normal, no murmur, click, rub or gallop Abdomen: soft, non-tender; bowel sounds normal; no masses,  no organomegaly Pelvic: cervix normal in appearance, external genitalia normal, no adnexal masses or tenderness, no cervical motion tenderness, rectovaginal septum normal, uterus normal size, shape, and consistency and vagina normal without discharge    Assessment:    Healthy female exam. Depression; obesity, breastfeeding      Plan:  Pap obtained Will decrease zoloft to 50mg  daily in hopes of helping with  weight loss. Encouraged to exercise regularly and decrease daily caloric intake. RTC 1 year for AE    See After Visit Summary for Counseling Recommendations

## 2015-07-12 LAB — PAP IG, CT-NG, RFX HPV ALL
CHLAMYDIA, NUC. ACID AMP: NEGATIVE
GONOCOCCUS BY NUCLEIC ACID AMP: NEGATIVE
PAP SMEAR COMMENT: 0

## 2015-08-30 ENCOUNTER — Telehealth: Payer: Self-pay | Admitting: Obstetrics and Gynecology

## 2015-08-30 NOTE — Telephone Encounter (Signed)
Called pt NA, left message on Vm

## 2015-08-30 NOTE — Telephone Encounter (Signed)
PT CALLED AND SHE WANTED A CALL BACK FROM YOU

## 2015-09-01 ENCOUNTER — Telehealth: Payer: Self-pay | Admitting: Obstetrics and Gynecology

## 2015-09-01 NOTE — Telephone Encounter (Signed)
PT CALLED AND ASKED TO SPEAK WITH YOU AGAIN, SHE STATED SHE WOULD LIKE A CALL BACK FROM YOU WHEN YOU GET A CHANCE.

## 2015-09-03 ENCOUNTER — Telehealth: Payer: Self-pay | Admitting: Obstetrics and Gynecology

## 2015-09-03 NOTE — Telephone Encounter (Signed)
pls advise

## 2015-09-03 NOTE — Telephone Encounter (Signed)
Christine Arellano called and she is in a bad place emotionally. She wants to come in and talk to [facility] about her medications. There is nothing right now for the next few weeks. Do you think she could be fit in ?

## 2015-09-04 NOTE — Telephone Encounter (Signed)
I can adjust her meds over the phone, but remind her it is not going to make the situation go away, I still strongly suggest she get counseling as we discussed before.

## 2015-09-06 ENCOUNTER — Other Ambulatory Visit: Payer: Self-pay | Admitting: *Deleted

## 2015-09-06 ENCOUNTER — Telehealth: Payer: Self-pay | Admitting: Obstetrics and Gynecology

## 2015-09-06 DIAGNOSIS — F331 Major depressive disorder, recurrent, moderate: Secondary | ICD-10-CM

## 2015-09-06 NOTE — Telephone Encounter (Signed)
referrral was put in for psych

## 2015-09-06 NOTE — Telephone Encounter (Signed)
Was discussed with her mother with [facility], voiced understanding

## 2015-09-06 NOTE — Telephone Encounter (Signed)
Pt wants a consult /Referral to psychiatry

## 2015-09-07 ENCOUNTER — Ambulatory Visit: Payer: Medicaid Other | Admitting: Obstetrics and Gynecology

## 2015-09-15 ENCOUNTER — Telehealth: Payer: Self-pay | Admitting: *Deleted

## 2015-09-16 ENCOUNTER — Encounter: Payer: Self-pay | Admitting: Obstetrics and Gynecology

## 2015-09-16 ENCOUNTER — Ambulatory Visit (INDEPENDENT_AMBULATORY_CARE_PROVIDER_SITE_OTHER): Payer: Medicaid Other | Admitting: Obstetrics and Gynecology

## 2015-09-16 VITALS — BP 108/58 | HR 85 | Ht 62.0 in | Wt 164.3 lb

## 2015-09-16 DIAGNOSIS — E669 Obesity, unspecified: Secondary | ICD-10-CM

## 2015-09-16 DIAGNOSIS — F418 Other specified anxiety disorders: Secondary | ICD-10-CM | POA: Diagnosis not present

## 2015-09-16 MED ORDER — ALPRAZOLAM 1 MG PO TABS: 1.0000 mg | ORAL_TABLET | Freq: Three times a day (TID) | ORAL | Status: DC | PRN

## 2015-09-16 MED ORDER — BUPROPION HCL ER (XL) 150 MG PO TB24: 150.0000 mg | ORAL_TABLET | Freq: Every day | ORAL | Status: DC

## 2015-09-16 NOTE — Progress Notes (Signed)
Subjective:     Patient ID: Christine Arellano, female   DOB: [birth year], 25 y.o.   MRN: [id number]  HPI Spouse has left her for another woman that he got pregnant, is severely depressed, having return of panic attacks. Stopped Zoloft a few months ago, doesn't want to go back on them due to weight gain associated with them. Has stopped breastfeeding infant. Desires a different med to help and weight loss medication. Is not sleeping well. Has lost 14# since AE in February.family is not supportive. Does have a call in to see counselor.  Review of Systems See above    Objective:   Physical Exam A&Ox4  well groomed anxious female, tearful during exam. Appropriate interaction with infant and toddler son that are with her today. No physical signs of abuse or self harm Blood pressure 108/58, pulse 85, height 5\' 2"  (1.575 m), weight 164 lb 4.8 oz (74.526 kg), last menstrual period 09/03/2015, not currently breastfeeding.  Depression screen PHQ 2/9 09/16/2015  Decreased Interest 3  Down, Depressed, Hopeless 3  PHQ - 2 Score 6  Altered sleeping 2  Tired, decreased energy 1  Change in appetite 3  Feeling bad or failure about yourself  3  Trouble concentrating 1  Moving slowly or fidgety/restless 1  Suicidal thoughts 1  PHQ-9 Score 18  Difficult doing work/chores Somewhat difficult       Assessment:     Anxiety with depression Obesity Sleep disturbance     Plan:     Counseled regarding treatment options and different medications we can use.  RX sent in for wellbutrin 150mg  XL daily x 2 weeks then increase to 300mg  XL daily. Also gave rx for xanax use as needed 1/2 tablet during the day and then whole tablet at night when needed. Will revisit weight loss meds at next visit. To see counselor before return visit.  RTC 4 weeks   Mandisa Persinger [place], [place]

## 2015-09-22 ENCOUNTER — Encounter: Payer: Self-pay | Admitting: Obstetrics and Gynecology

## 2015-09-22 ENCOUNTER — Ambulatory Visit (INDEPENDENT_AMBULATORY_CARE_PROVIDER_SITE_OTHER): Payer: Medicaid Other | Admitting: Obstetrics and Gynecology

## 2015-09-22 VITALS — BP 104/62 | HR 84 | Wt 165.1 lb

## 2015-09-22 DIAGNOSIS — N760 Acute vaginitis: Secondary | ICD-10-CM

## 2015-09-22 DIAGNOSIS — R300 Dysuria: Secondary | ICD-10-CM | POA: Diagnosis not present

## 2015-09-22 DIAGNOSIS — N921 Excessive and frequent menstruation with irregular cycle: Secondary | ICD-10-CM | POA: Diagnosis not present

## 2015-09-22 DIAGNOSIS — A499 Bacterial infection, unspecified: Secondary | ICD-10-CM

## 2015-09-22 DIAGNOSIS — E669 Obesity, unspecified: Secondary | ICD-10-CM | POA: Insufficient documentation

## 2015-09-22 DIAGNOSIS — Z113 Encounter for screening for infections with a predominantly sexual mode of transmission: Secondary | ICD-10-CM

## 2015-09-22 DIAGNOSIS — B9689 Other specified bacterial agents as the cause of diseases classified elsewhere: Secondary | ICD-10-CM

## 2015-09-22 LAB — POCT URINALYSIS DIPSTICK
BILIRUBIN UA: NEGATIVE
GLUCOSE UA: NEGATIVE
KETONES UA: 15
Nitrite, UA: NEGATIVE
Protein, UA: NEGATIVE
RBC UA: NEGATIVE
Urobilinogen, UA: 0.2
pH, UA: 7

## 2015-09-22 MED ORDER — METRONIDAZOLE 500 MG PO TABS: 500.0000 mg | ORAL_TABLET | Freq: Two times a day (BID) | ORAL | Status: DC

## 2015-09-22 NOTE — Progress Notes (Signed)
Subjective:     Patient ID: Christine Arellano, female   DOB: January 06, 1991, 25 y.o.   MRN: [id number]  HPI Reports strong odor to urine x 3 days with burning after urination. Denies change in d/c, but is having moderate menstrual like bleeding every other week, and has been taking birth control pills as directed.  Desires STD testing as spouse is currently sleeping with another women that is 6 months pregnant. They are separated but trying to work on things. She is still severely depressed about situation and weight, and has restarted wellbutrin- but denies feeling better. Is not eating much and trying to hydrate.   Review of Systems See above    Objective:   Physical Exam A&O x4  well groomed female, aggitated and tearful. Filed Vitals:   09/22/15 1152  Weight: 165 lb 1.6 oz (74.889 kg)   Urinalysis    Component Value Date/Time   BILIRUBINUR neg 09/22/2015 1159   PROTEINUR neg 09/22/2015 1159   UROBILINOGEN 0.2 09/22/2015 1159   NITRITE neg 09/22/2015 1159   LEUKOCYTESUR moderate (2+)* 09/22/2015 1159    Pelvic exam: normal external genitalia, vulva, vagina, cervix, uterus and adnexa, dark red blood noted at cervical os. Otherwise vaginal dry (had tampon in). WET MOUNT done - results: clue cells, DNA probe for chlamydia and GC obtained, TNTC RBC noted.    Assessment:     Dysuria x 4 days BV Desires STD screening BTB on OCPs      Plan:     Labs obtained, will call with results. RX for flagyl sent in. RTC prn Urine sent for culture. To increase current BCP to twice a day for 48 hours, then back to one a day, amiting placebos in this pack.  Tieshia Rettinger [place], CNM

## 2015-09-23 LAB — HIV ANTIBODY (ROUTINE TESTING W REFLEX): HIV SCREEN 4TH GENERATION: NONREACTIVE

## 2015-09-23 LAB — HEPATITIS C ANTIBODY: Hep C Virus Ab: <0.1 s/co ratio (ref 0.0–0.9)

## 2015-09-23 LAB — RPR: RPR: NONREACTIVE

## 2015-09-23 LAB — HSV 1 AND 2 IGM ABS, INDIRECT: HSV 2 IgM: <1:10 {titer}

## 2015-09-24 ENCOUNTER — Other Ambulatory Visit: Payer: Self-pay | Admitting: Obstetrics and Gynecology

## 2015-09-24 ENCOUNTER — Telehealth: Payer: Self-pay | Admitting: *Deleted

## 2015-09-24 LAB — URINE CULTURE

## 2015-09-24 MED ORDER — NITROFURANTOIN MONOHYD MACRO 100 MG PO CAPS: 100.0000 mg | ORAL_CAPSULE | Freq: Two times a day (BID) | ORAL | Status: DC

## 2015-09-24 NOTE — Telephone Encounter (Signed)
-----   Message from Joylene Igo, [place] sent at 09/24/2015  2:20 PM EDT ----- Please let her know she has a UTI, I sent in antibiotic, and she needs to start in now.

## 2015-09-24 NOTE — Telephone Encounter (Signed)
Notified pt she voiced understanding 

## 2015-09-25 LAB — GC/CHLAMYDIA PROBE AMP
Chlamydia trachomatis, NAA: NEGATIVE
Neisseria gonorrhoeae by PCR: NEGATIVE

## 2015-09-29 ENCOUNTER — Telehealth: Payer: Self-pay | Admitting: *Deleted

## 2015-09-29 NOTE — Telephone Encounter (Signed)
Notified pt she voiced understanding 

## 2015-09-29 NOTE — Telephone Encounter (Signed)
Patient called  stated that she was told by Melody to doubled up on her birth control to make her period stop. Patient states she done that and it make her period heavy and her menstrual cycle is still on. Patient is wondering what to do next. Patient is requesting a call back. Call back number is [phone]

## 2015-09-29 NOTE — Telephone Encounter (Signed)
Tell her to stop pills x 4 days and restart. Tell her not to get frustrated as we will get it straightened out. That it is her bodies reaction to the stress

## 2015-09-29 NOTE — Telephone Encounter (Signed)
pls advise

## 2015-09-30 ENCOUNTER — Other Ambulatory Visit: Payer: Self-pay | Admitting: Obstetrics and Gynecology

## 2015-10-14 ENCOUNTER — Other Ambulatory Visit: Payer: Self-pay | Admitting: Obstetrics and Gynecology

## 2015-10-14 ENCOUNTER — Encounter: Payer: Self-pay | Admitting: Obstetrics and Gynecology

## 2015-10-14 ENCOUNTER — Ambulatory Visit (INDEPENDENT_AMBULATORY_CARE_PROVIDER_SITE_OTHER): Payer: Medicaid Other | Admitting: Obstetrics and Gynecology

## 2015-10-14 VITALS — BP 98/61 | HR 101 | Wt 150.7 lb

## 2015-10-14 DIAGNOSIS — F329 Major depressive disorder, single episode, unspecified: Secondary | ICD-10-CM

## 2015-10-14 DIAGNOSIS — K648 Other hemorrhoids: Secondary | ICD-10-CM

## 2015-10-14 DIAGNOSIS — E663 Overweight: Secondary | ICD-10-CM

## 2015-10-14 DIAGNOSIS — F32A Depression, unspecified: Secondary | ICD-10-CM

## 2015-10-14 DIAGNOSIS — K644 Residual hemorrhoidal skin tags: Secondary | ICD-10-CM

## 2015-10-14 MED ORDER — ALPRAZOLAM 1 MG PO TABS: 1.0000 mg | ORAL_TABLET | Freq: Three times a day (TID) | ORAL | Status: DC | PRN

## 2015-10-14 MED ORDER — PHENTERMINE HCL 37.5 MG PO TABS: 37.5000 mg | ORAL_TABLET | Freq: Every day | ORAL | Status: DC

## 2015-10-14 MED ORDER — CYANOCOBALAMIN 1000 MCG/ML IJ SOLN: 1000.0000 ug | Freq: Once | INTRAMUSCULAR | Status: DC

## 2015-10-14 MED ORDER — HYDROCORTISONE ACE-PRAMOXINE 1-1 % RE FOAM: 1.0000 | Freq: Two times a day (BID) | RECTAL | Status: DC

## 2015-10-14 NOTE — Progress Notes (Signed)
Subjective:     Patient ID: Christine Arellano, female   DOB: 05-May-1991, 25 y.o.   MRN: [id number]  HPI Seen last month for UTI and depression- finished two rounds of antibiotics before symptoms went away, states depression some better and xanax is helping her sleep at night. Does reports flare of external hemorrhoids and using analapram HCL cream- but it is not helping. History of thrombosed hemorrhoid in past.  Review of Systems See above    Objective:   Physical Exam A&Ox4 Well groomed female, no distress, mood and affect less depressed Blood pressure 98/61, pulse 101, weight 150 lb 11.2 oz (68.357 kg), last menstrual period 09/03/2015, not currently breastfeeding. Urine sent for culture 3 external hemorrhoids noted on exam- exquisitly tender, not thrombosed    Assessment:     External hemorrhoids Depression with current SNRI working well Overweight UTI recheck     Plan:     rx for proctofoam sent in and instructed on use To continue on current dose Wellbutrin and xanax Will wait on urine culture OK to start weight loss program- first B12 injection given RTC 4 weeks for wt/bp/B12.  Callee Rohrig [place], CNM

## 2015-10-15 ENCOUNTER — Ambulatory Visit: Payer: Medicaid Other | Admitting: Obstetrics and Gynecology

## 2015-10-17 LAB — URINE CULTURE: Organism ID, Bacteria: NO GROWTH

## 2015-10-19 ENCOUNTER — Telehealth: Payer: Self-pay | Admitting: Obstetrics and Gynecology

## 2015-10-19 DIAGNOSIS — K645 Perianal venous thrombosis: Secondary | ICD-10-CM

## 2015-10-19 NOTE — Telephone Encounter (Signed)
Patient called stating she is bleeding again and is still a week away from the placebo. She had 3 periods last month. She is thinking sh may need to be on a different b/c. She also has complaints of her hemorrhoid not getting any better, she is currently using rectal foam. Please Advise.

## 2015-10-19 NOTE — Telephone Encounter (Signed)
Pt will need an appt with a provider to change birth control, please have her scheduled with first available. Will place referral hemorrhoid as pt has a history of thrombosed hemorrhoids in the past.

## 2015-10-22 ENCOUNTER — Telehealth: Payer: Self-pay | Admitting: Obstetrics and Gynecology

## 2015-10-22 NOTE — Telephone Encounter (Signed)
Patient called stating she has noticed that she has been bruising very easily on her limbs but she doesn't remember hitting anything. She didn't know if this had anything to do with her frequent bleeding. Please Advise.

## 2015-10-26 ENCOUNTER — Other Ambulatory Visit: Payer: Self-pay | Admitting: Internal Medicine

## 2015-10-26 ENCOUNTER — Ambulatory Visit: Payer: Medicaid Other | Admitting: Surgery

## 2015-10-26 ENCOUNTER — Encounter: Payer: Self-pay | Admitting: Internal Medicine

## 2015-10-26 DIAGNOSIS — K644 Residual hemorrhoidal skin tags: Secondary | ICD-10-CM | POA: Insufficient documentation

## 2015-10-26 NOTE — Telephone Encounter (Signed)
Pt is coming in 10/27/15

## 2015-10-27 ENCOUNTER — Ambulatory Visit: Payer: Medicaid Other | Admitting: Obstetrics and Gynecology

## 2015-11-01 ENCOUNTER — Ambulatory Visit (INDEPENDENT_AMBULATORY_CARE_PROVIDER_SITE_OTHER): Payer: Medicaid Other | Admitting: Internal Medicine

## 2015-11-01 ENCOUNTER — Ambulatory Visit
Admission: RE | Admit: 2015-11-01 | Discharge: 2015-11-01 | Disposition: A | Discharge status: Home / Self Care | Payer: Medicaid Other | Source: Ambulatory Visit | Attending: Internal Medicine | Admitting: Internal Medicine

## 2015-11-01 ENCOUNTER — Encounter: Payer: Self-pay | Admitting: Internal Medicine

## 2015-11-01 VITALS — BP 122/80 | HR 78 | Resp 16 | Ht 62.0 in | Wt 149.0 lb

## 2015-11-01 DIAGNOSIS — S6992XA Unspecified injury of left wrist, hand and finger(s), initial encounter: Secondary | ICD-10-CM | POA: Insufficient documentation

## 2015-11-01 DIAGNOSIS — X58XXXA Exposure to other specified factors, initial encounter: Secondary | ICD-10-CM | POA: Insufficient documentation
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
## 2015-11-02 ENCOUNTER — Telehealth: Payer: Self-pay | Admitting: *Deleted

## 2015-11-02 NOTE — Telephone Encounter (Signed)
Patient called and states that she has an appt with Melody on 11/09/15 and her anxiety level is very high. She was wondering if Melody can prescribe her something. Pt contact number [phone]

## 2015-11-03 ENCOUNTER — Other Ambulatory Visit: Payer: Self-pay | Admitting: Obstetrics and Gynecology

## 2015-11-03 DIAGNOSIS — F329 Major depressive disorder, single episode, unspecified: Secondary | ICD-10-CM

## 2015-11-03 DIAGNOSIS — F419 Anxiety disorder, unspecified: Principal | ICD-10-CM

## 2015-11-03 MED ORDER — ALPRAZOLAM 1 MG PO TABS: 1.0000 mg | ORAL_TABLET | Freq: Three times a day (TID) | ORAL | Status: DC | PRN

## 2015-11-03 NOTE — Telephone Encounter (Signed)
Please let her know I can prscribe some xanax for short term use, but am also going to send referral for psychiatry as she needs to get counseling and more follow up than I am qualified to do.

## 2015-11-03 NOTE — Telephone Encounter (Signed)
Pt aware.

## 2015-11-04 ENCOUNTER — Other Ambulatory Visit: Payer: Self-pay | Admitting: Internal Medicine

## 2015-11-04 DIAGNOSIS — F329 Major depressive disorder, single episode, unspecified: Secondary | ICD-10-CM

## 2015-11-04 DIAGNOSIS — F32A Depression, unspecified: Secondary | ICD-10-CM

## 2015-11-09 ENCOUNTER — Ambulatory Visit (INDEPENDENT_AMBULATORY_CARE_PROVIDER_SITE_OTHER): Payer: Medicaid Other | Admitting: Obstetrics and Gynecology

## 2015-11-09 ENCOUNTER — Encounter: Payer: Self-pay | Admitting: Obstetrics and Gynecology

## 2015-11-09 VITALS — BP 101/61 | HR 87 | Ht 62.0 in | Wt 135.9 lb

## 2015-11-09 DIAGNOSIS — N926 Irregular menstruation, unspecified: Secondary | ICD-10-CM

## 2015-11-09 DIAGNOSIS — Z87440 Personal history of urinary (tract) infections: Secondary | ICD-10-CM | POA: Diagnosis not present

## 2015-11-09 LAB — POCT URINALYSIS DIPSTICK
Bilirubin, UA: NEGATIVE
Glucose, UA: NEGATIVE
KETONES UA: NEGATIVE
Nitrite, UA: NEGATIVE
PH UA: 6
PROTEIN UA: NEGATIVE
RBC UA: NEGATIVE
SPEC GRAV UA: 1.015
Urobilinogen, UA: NEGATIVE

## 2015-11-09 LAB — POCT URINE PREGNANCY: PREG TEST UR: NEGATIVE

## 2015-11-09 MED ORDER — CYANOCOBALAMIN 1000 MCG/ML IJ SOLN
1000.0000 ug | Freq: Once | INTRAMUSCULAR | Status: AC
  Administered 2015-11-09: 1000 ug via INTRAMUSCULAR

## 2015-11-09 MED ORDER — DESOGESTREL-ETHINYL ESTRADIOL 0.15-0.02/0.01 MG (21/5) PO TABS: 1.0000 | ORAL_TABLET | Freq: Every day | ORAL | Status: DC
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
## 2015-11-10 LAB — URINE CULTURE: ORGANISM ID, BACTERIA: NO GROWTH

## 2015-11-11 ENCOUNTER — Ambulatory Visit: Payer: Medicaid Other

## 2015-11-22 ENCOUNTER — Other Ambulatory Visit: Payer: Self-pay | Admitting: *Deleted

## 2015-11-22 MED ORDER — IBUPROFEN 800 MG PO TABS: 800.0000 mg | ORAL_TABLET | Freq: Four times a day (QID) | ORAL | Status: DC | PRN

## 2015-12-15 ENCOUNTER — Ambulatory Visit (INDEPENDENT_AMBULATORY_CARE_PROVIDER_SITE_OTHER): Payer: Medicaid Other | Admitting: Obstetrics and Gynecology

## 2015-12-15 ENCOUNTER — Encounter: Payer: Self-pay | Admitting: Obstetrics and Gynecology

## 2015-12-15 VITALS — BP 126/70 | HR 95 | Wt 124.8 lb

## 2015-12-15 DIAGNOSIS — R233 Spontaneous ecchymoses: Secondary | ICD-10-CM

## 2015-12-15 DIAGNOSIS — Z87440 Personal history of urinary (tract) infections: Secondary | ICD-10-CM | POA: Diagnosis not present

## 2015-12-15 DIAGNOSIS — R238 Other skin changes: Secondary | ICD-10-CM | POA: Diagnosis not present

## 2015-12-15 DIAGNOSIS — Z79899 Other long term (current) drug therapy: Secondary | ICD-10-CM

## 2015-12-15 LAB — POCT URINALYSIS DIPSTICK
BILIRUBIN UA: NEGATIVE
Blood, UA: NEGATIVE
GLUCOSE UA: NEGATIVE
Ketones, UA: NEGATIVE
NITRITE UA: POSITIVE
PH UA: 6.5
Spec Grav, UA: 1.01
Urobilinogen, UA: 0.2

## 2015-12-15 MED ORDER — NITROFURANTOIN MONOHYD MACRO 100 MG PO CAPS: 100.0000 mg | ORAL_CAPSULE | Freq: Two times a day (BID) | ORAL | Status: DC
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
## 2015-12-16 LAB — CBC
Hematocrit: 41.1 % (ref 34.0–46.6)
Hemoglobin: 13.5 g/dL (ref 11.1–15.9)
MCH: 29.3 pg (ref 26.6–33.0)
MCHC: 32.8 g/dL (ref 31.5–35.7)
MCV: 89 fL (ref 79–97)
PLATELETS: 310 10*3/uL (ref 150–379)
RBC: 4.6 x10E6/uL (ref 3.77–5.28)
RDW: 14.1 % (ref 12.3–15.4)
WBC: 8.6 10*3/uL (ref 3.4–10.8)

## 2015-12-16 LAB — VITAMIN D 25 HYDROXY (VIT D DEFICIENCY, FRACTURES): VIT D 25 HYDROXY: 52.7 ng/mL (ref 30.0–100.0)

## 2015-12-16 LAB — IRON: Iron: 91 ug/dL (ref 27–159)

## 2015-12-16 LAB — PROTIME-INR
INR: 1 (ref 0.8–1.2)
Prothrombin Time: 10.4 s (ref 9.1–12.0)

## 2015-12-16 LAB — APTT: APTT: 25 s (ref 24–33)

## 2015-12-17 LAB — URINE CULTURE

## 2016-01-06 ENCOUNTER — Telehealth: Payer: Self-pay | Admitting: Obstetrics and Gynecology

## 2016-01-06 NOTE — Telephone Encounter (Signed)
Patient called asking for referral for therapy. Is having a difficult time coping with current situation and requests referral for therapy - not a walk-in but an appointment. The patient has Medicaid; also wants to see female.

## 2016-01-08 ENCOUNTER — Other Ambulatory Visit: Payer: Self-pay | Admitting: Obstetrics and Gynecology

## 2016-01-12 NOTE — Telephone Encounter (Signed)
I spoke with patient. I gave her the contact info and address for [facility] in [place] whom except medicaid. Patient stated she is doing ok she just needs someone to talk to and help her cope with her situation.

## 2016-02-07 ENCOUNTER — Telehealth: Payer: Self-pay | Admitting: Obstetrics and Gynecology

## 2016-02-07 NOTE — Telephone Encounter (Signed)
Christine Arellano keeps getting a UTI after her periods. She wants to know if it is something she is doing or something she can do to prevent it.

## 2016-02-08 NOTE — Telephone Encounter (Signed)
Drink more water, change pads/tampon frequently, also can take OTC AZO to try to prevent them.

## 2016-02-08 NOTE — Telephone Encounter (Signed)
pls advise

## 2016-02-08 NOTE — Telephone Encounter (Signed)
Spoke with pt

## 2016-03-21 ENCOUNTER — Ambulatory Visit (INDEPENDENT_AMBULATORY_CARE_PROVIDER_SITE_OTHER): Payer: Medicaid Other | Admitting: Internal Medicine

## 2016-03-21 ENCOUNTER — Encounter: Payer: Self-pay | Admitting: Internal Medicine

## 2016-03-21 VITALS — BP 122/82 | HR 76 | Resp 16 | Ht 62.0 in | Wt 128.4 lb

## 2016-03-21 DIAGNOSIS — F39 Unspecified mood [affective] disorder: Secondary | ICD-10-CM

## 2016-03-21 DIAGNOSIS — R300 Dysuria: Secondary | ICD-10-CM | POA: Diagnosis not present

## 2016-03-21 HISTORY — DX: Unspecified mood (affective) disorder: F39

## 2016-03-21 LAB — POCT URINALYSIS DIPSTICK
Bilirubin, UA: NEGATIVE
Blood, UA: NEGATIVE
Glucose, UA: NEGATIVE
KETONES UA: NEGATIVE
NITRITE UA: POSITIVE
PH UA: 6
PROTEIN UA: NEGATIVE
Spec Grav, UA: 1.015
UROBILINOGEN UA: 0.2

## 2016-03-21 MED ORDER — CIPROFLOXACIN HCL 250 MG PO TABS: 250.0000 mg | ORAL_TABLET | Freq: Two times a day (BID) | ORAL | 0 refills | Status: DC
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
## 2016-05-15 ENCOUNTER — Other Ambulatory Visit: Payer: Self-pay | Admitting: Obstetrics and Gynecology

## 2016-05-29 NOTE — L&D Delivery Note (Signed)
Delivery Summary for Christine Arellano  Labor Events:   Preterm labor:   Rupture date:   Rupture time:   Rupture type: Intact  Fluid Color:   Induction:   Augmentation:   Complications:   Cervical ripening:          Delivery:   Episiotomy:   Lacerations:   Repair suture:   Repair # of packets:   Blood loss (ml): 500 ml   Information for the patient's newborn:  [name], [name] [[id number]]    Delivery 05/09/2017 1:10 PM by  C-Section, Low Transverse Sex:  female Gestational Age: [redacted]w[redacted]d Delivery Clinician:   Living?:         APGARS  One minute Five minutes Ten minutes  Skin color:        Heart rate:        Grimace:        Muscle tone:        Breathing:        Totals: 9  9      Presentation/position:      Resuscitation:   Cord information:    Disposition of cord blood:     Blood gases sent?  Complications:   Placenta: Delivered:       appearance Newborn Measurements: Weight: 6 lb 4.5 oz (2850 g)  Height: 19.09"  Head circumference:    Chest circumference:    Other providers:    Additional  information: Forceps:   Vacuum:   Breech:   Observed anomalies        See Dr. Andreas Blower operative note for details of C-section.    Rubie Maid, MD [facility]

## 2016-06-06 ENCOUNTER — Encounter: Payer: Self-pay | Admitting: Obstetrics and Gynecology

## 2016-06-06 ENCOUNTER — Ambulatory Visit (INDEPENDENT_AMBULATORY_CARE_PROVIDER_SITE_OTHER): Payer: Medicaid Other | Admitting: Obstetrics and Gynecology

## 2016-06-06 VITALS — BP 111/59 | HR 79 | Ht 62.0 in | Wt 140.3 lb

## 2016-06-06 DIAGNOSIS — N921 Excessive and frequent menstruation with irregular cycle: Secondary | ICD-10-CM

## 2016-06-06 DIAGNOSIS — Z23 Encounter for immunization: Secondary | ICD-10-CM

## 2016-06-06 MED ORDER — METRONIDAZOLE 500 MG PO TABS: 500.0000 mg | ORAL_TABLET | Freq: Two times a day (BID) | ORAL | 1 refills | Status: DC

## 2016-06-06 MED ORDER — DROSPIREN-ETH ESTRAD-LEVOMEFOL 3-0.02-0.451 MG PO TABS: 1.0000 | ORAL_TABLET | Freq: Every day | ORAL | 11 refills | Status: DC

## 2016-06-06 NOTE — Progress Notes (Signed)
Subjective:     Patient ID: Christine Arellano, female   DOB: Oct 18, 1990, 26 y.o.   MRN: [id number]  HPI Reports monthly spotting in last week of pills and irregular bleeding during placebo week for last 3-6 months. Getting more noticeable. Also reports vaginal dryness during sex making it painful. And fishy odor for a day or two after sex, then it goes away.   Review of Systems Negative except stated in HPI.    Objective:   Physical Exam A&O x4 Well groomed female in no distress Blood pressure (!) 111/59, pulse 79, height 5\' 2"  (1.575 m), weight 140 lb 4.8 oz (63.6 kg), not currently breastfeeding. PE declined    Assessment:     BTB on OCPs Vaginal dryness Flu vaccine needed.     Plan:     Switch OCPs to Beyaz Flagyl for prn use Encourage lubricant use  RTC as needed.  Boby Eyer [place], CNM Flu vaccine given

## 2016-06-12 ENCOUNTER — Other Ambulatory Visit: Payer: Self-pay | Admitting: Obstetrics and Gynecology

## 2016-06-21 ENCOUNTER — Other Ambulatory Visit: Payer: Self-pay | Admitting: Internal Medicine

## 2016-06-21 ENCOUNTER — Ambulatory Visit: Payer: Medicaid Other | Admitting: Internal Medicine

## 2016-06-22 ENCOUNTER — Ambulatory Visit (INDEPENDENT_AMBULATORY_CARE_PROVIDER_SITE_OTHER): Payer: Medicaid Other | Admitting: Internal Medicine

## 2016-06-22 ENCOUNTER — Encounter: Payer: Self-pay | Admitting: Internal Medicine

## 2016-06-22 VITALS — BP 106/68 | HR 82 | Temp 97.6°F | Ht 62.0 in | Wt 145.0 lb

## 2016-06-22 DIAGNOSIS — F39 Unspecified mood [affective] disorder: Secondary | ICD-10-CM | POA: Diagnosis not present

## 2016-06-22 DIAGNOSIS — G8929 Other chronic pain: Secondary | ICD-10-CM | POA: Diagnosis not present

## 2016-06-22 DIAGNOSIS — M25561 Pain in right knee: Secondary | ICD-10-CM

## 2016-06-22 DIAGNOSIS — M25569 Pain in unspecified knee: Secondary | ICD-10-CM

## 2016-06-22 MED ORDER — CITALOPRAM HYDROBROMIDE 20 MG PO TABS: 20.0000 mg | ORAL_TABLET | Freq: Every day | ORAL | 3 refills | Status: DC

## 2016-06-22 NOTE — Progress Notes (Signed)
Date:  06/22/2016   Name:  Christine Arellano   DOB:  27-Nov-1990   MRN:  [id number]   Chief Complaint: Knee Injury Knee Pain   Incident onset: remote hx of injury. The injury mechanism was a twisting injury (10 yrs ago - had surgergy and PCL torn). The pain is present in the right knee. The quality of the pain is described as shooting and stabbing. The pain is moderate. The pain has been fluctuating since onset. Associated symptoms include an inability to bear weight.  Anxiety  Presents for follow-up visit. Symptoms include excessive worry, irritability and nervous/anxious behavior. Patient reports no chest pain, palpitations or shortness of breath. Symptoms occur most days. The severity of symptoms is interfering with daily activities.   Compliance with medications: on bupropion; has used xanax in the past from GYN.      Review of Systems  Constitutional: Positive for irritability. Negative for chills and fever.  Respiratory: Negative for chest tightness and shortness of breath.   Cardiovascular: Negative for chest pain, palpitations and leg swelling.  Gastrointestinal: Negative for abdominal pain.  Musculoskeletal: Positive for arthralgias, joint swelling and myalgias.  Psychiatric/Behavioral: Positive for dysphoric mood and sleep disturbance. The patient is nervous/anxious.     Patient Active Problem List   Diagnosis Date Noted  . Mood disorder ([place]) 03/21/2016  . Hemorrhoids, external 10/26/2015  . Obesity (BMI 30-39.9) 09/22/2015  . Depression 05/03/2015  . S/P cesarean section 02/24/2015    Prior to Admission medications   Medication Sig Start Date End Date Taking? Authorizing Provider  Biotin 5000 MCG CAPS Take by mouth.   Yes Historical Provider, MD  buPROPion (WELLBUTRIN XL) 150 MG 24 hr tablet take 2 tablets by mouth once daily 05/15/16  Yes Melody N Shambley, CNM  Drospirenone-Ethinyl Estradiol-Levomefol (BEYAZ) 3-0.02-0.451 MG tablet Take 1 tablet by mouth daily.  06/06/16  Yes Melody N Shambley, CNM  ibuprofen (ADVIL,MOTRIN) 800 MG tablet take 1 tablet by mouth every 6 hours if needed 06/12/16  Yes Melody N Shambley, CNM  metroNIDAZOLE (FLAGYL) 500 MG tablet Take 1 tablet (500 mg total) by mouth 2 (two) times daily. 06/06/16  Yes Melody Rockney Ghee, CNM    No Known Allergies  Past Surgical History:  Procedure Laterality Date  . CESAREAN SECTION    . CESAREAN SECTION N/A 02/24/2015   Procedure: CESAREAN SECTION;  Surgeon: Rubie Maid, MD;  Location: [facility] ORS;  Service: Obstetrics;  Laterality: N/A;  . HEMORROIDECTOMY    . TONSILLECTOMY      Social History  Substance Use Topics  . Smoking status: Former Research scientist (life sciences)  . Smokeless tobacco: Never Used  . Alcohol use No     Medication list has been reviewed and updated.   Physical Exam  Constitutional: She is oriented to person, place, and time. She appears well-developed. No distress.  HENT:  Head: Normocephalic and atraumatic.  Pulmonary/Chest: Effort normal. No respiratory distress.  Musculoskeletal: Normal range of motion.       Right knee: She exhibits LCL laxity. She exhibits normal range of motion, no swelling and no effusion. No tenderness found.  Neurological: She is alert and oriented to person, place, and time.  Skin: Skin is warm and dry. No rash noted.  Psychiatric: She has a normal mood and affect. Her speech is normal and behavior is normal. Thought content normal.  Nursing note and vitals reviewed.   BP 106/68   Pulse 82   Temp 97.6 F (36.4 C)   Ht  5\' 2"  (1.575 m)   Wt 145 lb (65.8 kg)   SpO2 98%   BMI 26.52 kg/m   Assessment and Plan: 1. 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 Mood disorder ([place]) Continue bupropion Declined request for Xanax - citalopram (CELEXA) 20 MG tablet; Take 1 tablet (20 mg total) by mouth daily.  Dispense: 30 tablet; Refill: [place],  MD [facility]  06/22/2016

## 2016-07-06 DIAGNOSIS — M25569 Pain in unspecified knee: Secondary | ICD-10-CM | POA: Insufficient documentation

## 2016-07-06 DIAGNOSIS — G8929 Other chronic pain: Secondary | ICD-10-CM | POA: Insufficient documentation

## 2016-07-06 DIAGNOSIS — M25561 Pain in right knee: Secondary | ICD-10-CM | POA: Diagnosis not present

## 2016-07-06 HISTORY — DX: Pain in unspecified knee: M25.569

## 2016-07-11 ENCOUNTER — Other Ambulatory Visit: Payer: Self-pay | Admitting: Obstetrics and Gynecology

## 2016-07-11 ENCOUNTER — Encounter: Payer: Self-pay | Admitting: Obstetrics and Gynecology

## 2016-07-11 ENCOUNTER — Ambulatory Visit (INDEPENDENT_AMBULATORY_CARE_PROVIDER_SITE_OTHER): Payer: Medicaid Other | Admitting: Obstetrics and Gynecology

## 2016-07-11 VITALS — BP 107/59 | HR 83 | Ht 62.0 in | Wt 138.3 lb

## 2016-07-11 DIAGNOSIS — Z0000 Encounter for general adult medical examination without abnormal findings: Secondary | ICD-10-CM

## 2016-07-11 DIAGNOSIS — Z01419 Encounter for gynecological examination (general) (routine) without abnormal findings: Secondary | ICD-10-CM

## 2016-07-11 NOTE — Progress Notes (Signed)
   Subjective:     RAEGIN HAINES is a 26 y.o. female and is here for a comprehensive physical exam. The patient reports no problems. Did report no menses last month on current OCP, other wise feels fine on them.  Social History   Social History  . Marital status: Single    Spouse name: N/A  . Number of children: N/A  . Years of education: N/A   Occupational History  . Not on file.   Social History Main Topics  . Smoking status: Former Research scientist (life sciences)  . Smokeless tobacco: Never Used  . Alcohol use No  . Drug use: No  . Sexual activity: Yes    Birth control/ protection: Pill   Other Topics Concern  . Not on file   Social History Narrative  . No narrative on file   Health Maintenance  Topic Date Due  . PAP SMEAR  07/07/2018  . TETANUS/TDAP  12/02/2024  . INFLUENZA VACCINE  Completed  . HIV Screening  Completed    The following portions of the patient's history were reviewed and updated as appropriate: allergies, current medications, past family history, past medical history, past social history, past surgical history and problem list.  Review of Systems A comprehensive review of systems was negative.   Objective:    General appearance: alert, cooperative and appears stated age Neck: no adenopathy, no carotid bruit, no JVD, supple, symmetrical, trachea midline and thyroid not enlarged, symmetric, no tenderness/mass/nodules Lungs: clear to auscultation bilaterally Breasts: normal appearance, no masses or tenderness Heart: regular rate and rhythm, S1, S2 normal, no murmur, click, rub or gallop Abdomen: soft, non-tender; bowel sounds normal; no masses,  no organomegaly Pelvic: cervix normal in appearance, external genitalia normal, no adnexal masses or tenderness, no cervical motion tenderness, rectovaginal septum normal, uterus normal size, shape, and consistency and vagina normal without discharge    Assessment:    Healthy female exam. Missed menses on OCPs     Plan:   Happy with OCPs- will continue RTC 1 year or as needed  Melody Gayla Medicus, CNM   See After Visit Summary for Counseling Recommendations

## 2016-07-13 LAB — CYTOLOGY - PAP

## 2016-08-04 ENCOUNTER — Telehealth: Payer: Self-pay | Admitting: *Deleted

## 2016-08-04 NOTE — Telephone Encounter (Signed)
Patient called and stated that she wants the nurse to call her back she has a couple of questions to ask. Patient is requesting a call back. Her number is [phone]. Thank you

## 2016-08-14 NOTE — Telephone Encounter (Signed)
Tried calling pt back several times na or call back

## 2016-09-05 ENCOUNTER — Ambulatory Visit (INDEPENDENT_AMBULATORY_CARE_PROVIDER_SITE_OTHER): Payer: Medicaid Other | Admitting: Certified Nurse Midwife

## 2016-09-05 ENCOUNTER — Encounter: Payer: Self-pay | Admitting: Certified Nurse Midwife

## 2016-09-05 VITALS — BP 107/72 | HR 77 | Wt 152.4 lb

## 2016-09-05 DIAGNOSIS — R350 Frequency of micturition: Secondary | ICD-10-CM | POA: Diagnosis not present

## 2016-09-05 DIAGNOSIS — R300 Dysuria: Secondary | ICD-10-CM | POA: Diagnosis not present

## 2016-09-05 DIAGNOSIS — N926 Irregular menstruation, unspecified: Secondary | ICD-10-CM | POA: Diagnosis not present

## 2016-09-05 DIAGNOSIS — Z3491 Encounter for supervision of normal pregnancy, unspecified, first trimester: Secondary | ICD-10-CM | POA: Diagnosis not present

## 2016-09-05 LAB — POCT URINALYSIS DIPSTICK
BILIRUBIN UA: NEGATIVE
Blood, UA: NEGATIVE
Glucose, UA: NEGATIVE
KETONES UA: NEGATIVE
Leukocytes, UA: NEGATIVE
Nitrite, UA: POSITIVE
PH UA: 6 (ref 5.0–8.0)
Protein, UA: NEGATIVE
Spec Grav, UA: 1.015 (ref 1.030–1.035)
Urobilinogen, UA: NEGATIVE (ref ?–2.0)

## 2016-09-05 LAB — POCT URINE PREGNANCY: Preg Test, Ur: POSITIVE — AB

## 2016-09-05 MED ORDER — NITROFURANTOIN MONOHYD MACRO 100 MG PO CAPS: 100.0000 mg | ORAL_CAPSULE | Freq: Two times a day (BID) | ORAL | 0 refills | Status: AC

## 2016-09-05 NOTE — Progress Notes (Signed)
Subjective:    Christine Arellano is a 26 y.o. female who presents for evaluation of amenorrhea. She believes she could be pregnant. Pregnancy is desired. Sexual Activity: single partner, contraception: OCP (estrogen/progesterone). Stopped birth control to get pregnant. Current symptoms also include: She has UTI symptoms, burning, frequency, urgency . Last period was normal.   Patient's last menstrual period was 08/05/2016 (exact date). The following portions of the patient's history were reviewed and updated as appropriate: allergies, current medications, past family history, past medical history, past social history, past surgical history and problem list.  Review of Systems Pertinent items are noted in HPI.     Objective:    BP 107/72   Pulse 77   Wt 152 lb 6.4 oz (69.1 kg)   LMP 08/05/2016 (Exact Date)   Breastfeeding? No   BMI 27.87 kg/m   General: alert, cooperative, appears stated age and no acute distress    Lab Review Urine HCG: positive   Urine + nitrates , urine culture sent today Assessment:    Absence of menstruation.   UTI  Plan:    Pregnancy Test: ositive: EDC: 05/12/17. Briefly discussed pre-natal care options. Encouraged well-balanced diet, plenty of rest when needed, pre-natal vitamins daily and walking for exercise. Discussed self-help for nausea, avoiding OTC medications until consulting provider or pharmacist, other than Tylenol as needed, minimal caffeine (1-2 cups daily) and avoiding alcohol.  Miscarriage precautions reviewed. Macrobid sent to her pharmacy. She will schedule a ultrasound for dating in 3-4 wks, followed by a nurse visit in 4 wks and her initial OB visit in 7wks. Feel free to call with any questions.

## 2016-09-08 ENCOUNTER — Other Ambulatory Visit: Payer: Self-pay | Admitting: Certified Nurse Midwife

## 2016-09-08 LAB — URINE CULTURE

## 2016-09-15 ENCOUNTER — Other Ambulatory Visit: Payer: Self-pay | Admitting: Certified Nurse Midwife

## 2016-09-15 DIAGNOSIS — Z369 Encounter for antenatal screening, unspecified: Secondary | ICD-10-CM

## 2016-09-28 ENCOUNTER — Other Ambulatory Visit: Payer: Self-pay | Admitting: Certified Nurse Midwife

## 2016-09-28 ENCOUNTER — Ambulatory Visit (INDEPENDENT_AMBULATORY_CARE_PROVIDER_SITE_OTHER): Payer: Medicaid Other

## 2016-09-28 DIAGNOSIS — Z369 Encounter for antenatal screening, unspecified: Secondary | ICD-10-CM

## 2016-09-28 DIAGNOSIS — Z3491 Encounter for supervision of normal pregnancy, unspecified, first trimester: Secondary | ICD-10-CM | POA: Diagnosis not present

## 2016-10-13 ENCOUNTER — Other Ambulatory Visit: Payer: Medicaid Other

## 2016-10-13 DIAGNOSIS — Z1379 Encounter for other screening for genetic and chromosomal anomalies: Secondary | ICD-10-CM

## 2016-10-13 NOTE — Addendum Note (Signed)
Addended by: Elouise Munroe on: 10/13/2016 12:00 PM   Modules accepted: Orders

## 2016-10-17 ENCOUNTER — Ambulatory Visit: Payer: Medicaid Other | Admitting: Obstetrics and Gynecology

## 2016-10-17 VITALS — BP 118/61 | HR 82 | Wt 155.0 lb

## 2016-10-17 DIAGNOSIS — Z3481 Encounter for supervision of other normal pregnancy, first trimester: Secondary | ICD-10-CM

## 2016-10-17 NOTE — Progress Notes (Signed)
Barry Dienes presents for NOB nurse interview visit. Pregnancy confirmation done 09/05/16. G-4 .  P- 2. Pregnancy education material explained and given. No___ cats in the home. NOB labs ordered.Marland KitchenHIV labs and Drug screen were explained optional and she did not decline. Drug screen ordered. PNV encouraged. Genetic screening options discussed. Genetic testing:was done on 10/13/16.Marland Kitchen Pt. To follow up with provider in _2_ weeks for NOB physical.  All questions answered.

## 2016-10-18 LAB — ABO AND RH: Rh Factor: POSITIVE

## 2016-10-18 LAB — MONITOR DRUG PROFILE 14(MW)
AMPHETAMINE SCREEN URINE: NEGATIVE ng/mL
BARBITURATE SCREEN URINE: NEGATIVE ng/mL
BENZODIAZEPINE SCREEN, URINE: NEGATIVE ng/mL
Buprenorphine, Urine: NEGATIVE ng/mL
CANNABINOIDS UR QL SCN: NEGATIVE ng/mL
CREATININE(CRT), U: 54.9 mg/dL (ref 20.0–300.0)
Cocaine (Metab) Scrn, Ur: NEGATIVE ng/mL
Fentanyl, Urine: NEGATIVE pg/mL
METHADONE SCREEN, URINE: NEGATIVE ng/mL
Meperidine Screen, Urine: NEGATIVE ng/mL
OPIATE SCREEN URINE: NEGATIVE ng/mL
OXYCODONE+OXYMORPHONE UR QL SCN: NEGATIVE ng/mL
PH UR, DRUG SCRN: 5.5 (ref 4.5–8.9)
Phencyclidine Qn, Ur: NEGATIVE ng/mL
Propoxyphene Scrn, Ur: NEGATIVE ng/mL
SPECIFIC GRAVITY: 1.011
TRAMADOL SCREEN, URINE: NEGATIVE ng/mL

## 2016-10-18 LAB — CBC WITH DIFFERENTIAL/PLATELET
Basophils Absolute: 0 10*3/uL (ref 0.0–0.2)
Basos: 0 %
EOS (ABSOLUTE): 0.1 10*3/uL (ref 0.0–0.4)
Eos: 1 %
Hematocrit: 38.3 % (ref 34.0–46.6)
Hemoglobin: 12.5 g/dL (ref 11.1–15.9)
IMMATURE GRANULOCYTES: 0 %
Immature Grans (Abs): 0 10*3/uL (ref 0.0–0.1)
Lymphocytes Absolute: 1.8 10*3/uL (ref 0.7–3.1)
Lymphs: 19 %
MCH: 29.8 pg (ref 26.6–33.0)
MCHC: 32.6 g/dL (ref 31.5–35.7)
MCV: 91 fL (ref 79–97)
MONOS ABS: 0.5 10*3/uL (ref 0.1–0.9)
Monocytes: 5 %
NEUTROS PCT: 75 %
Neutrophils Absolute: 6.9 10*3/uL (ref 1.4–7.0)
PLATELETS: 259 10*3/uL (ref 150–379)
RBC: 4.19 x10E6/uL (ref 3.77–5.28)
RDW: 13.5 % (ref 12.3–15.4)
WBC: 9.3 10*3/uL (ref 3.4–10.8)

## 2016-10-18 LAB — URINALYSIS, ROUTINE W REFLEX MICROSCOPIC
BILIRUBIN UA: NEGATIVE
GLUCOSE, UA: NEGATIVE
LEUKOCYTES UA: NEGATIVE
Nitrite, UA: NEGATIVE
PH UA: 5.5 (ref 5.0–7.5)
PROTEIN UA: NEGATIVE
RBC UA: NEGATIVE
Specific Gravity, UA: 1.013 (ref 1.005–1.030)
Urobilinogen, Ur: 0.2 mg/dL (ref 0.2–1.0)

## 2016-10-18 LAB — GC/CHLAMYDIA PROBE AMP
CHLAMYDIA, DNA PROBE: NEGATIVE
NEISSERIA GONORRHOEAE BY PCR: NEGATIVE

## 2016-10-18 LAB — VARICELLA ZOSTER ANTIBODY, IGG: Varicella zoster IgG: 326 index (ref >165)

## 2016-10-18 LAB — HIV ANTIBODY (ROUTINE TESTING W REFLEX): HIV SCREEN 4TH GENERATION: NONREACTIVE

## 2016-10-18 LAB — RUBELLA SCREEN: RUBELLA: 2.22 {index} (ref >0.99)

## 2016-10-18 LAB — RPR: RPR: NONREACTIVE

## 2016-10-18 LAB — ANTIBODY SCREEN: Antibody Screen: NEGATIVE

## 2016-10-18 LAB — HEPATITIS B SURFACE ANTIGEN: HEP B S AG: NEGATIVE

## 2016-10-19 LAB — MATERNIT 21 PLUS CORE, BLOOD
CHROMOSOME 13: NEGATIVE
Chromosome 18: NEGATIVE
Chromosome 21: NEGATIVE
Y Chromosome: NOT DETECTED

## 2016-10-19 LAB — URINE CULTURE

## 2016-10-20 ENCOUNTER — Telehealth: Payer: Self-pay | Admitting: *Deleted

## 2016-10-20 NOTE — Telephone Encounter (Signed)
-----   Message from Joylene Igo, [place] sent at 10/20/2016  8:44 AM EDT ----- Please let her know results

## 2016-10-20 NOTE — Telephone Encounter (Signed)
Pt was notified.  

## 2016-10-31 ENCOUNTER — Telehealth: Payer: Self-pay | Admitting: Obstetrics and Gynecology

## 2016-10-31 NOTE — Telephone Encounter (Signed)
Spoke with pt

## 2016-10-31 NOTE — Telephone Encounter (Signed)
Patient wants to know if its okay to use [facility] for her acne - the label said that she should check with her provider

## 2016-11-07 ENCOUNTER — Ambulatory Visit (INDEPENDENT_AMBULATORY_CARE_PROVIDER_SITE_OTHER): Payer: Medicaid Other | Admitting: Obstetrics and Gynecology

## 2016-11-07 VITALS — BP 119/60 | HR 78 | Wt 156.0 lb

## 2016-11-07 DIAGNOSIS — G8929 Other chronic pain: Secondary | ICD-10-CM

## 2016-11-07 DIAGNOSIS — K5900 Constipation, unspecified: Secondary | ICD-10-CM

## 2016-11-07 DIAGNOSIS — Z3492 Encounter for supervision of normal pregnancy, unspecified, second trimester: Secondary | ICD-10-CM

## 2016-11-07 DIAGNOSIS — Z3481 Encounter for supervision of other normal pregnancy, first trimester: Secondary | ICD-10-CM | POA: Diagnosis not present

## 2016-11-07 DIAGNOSIS — R51 Headache: Secondary | ICD-10-CM

## 2016-11-07 LAB — POCT URINALYSIS DIPSTICK
Bilirubin, UA: NEGATIVE
GLUCOSE UA: NEGATIVE
Ketones, UA: NEGATIVE
Leukocytes, UA: NEGATIVE
Nitrite, UA: NEGATIVE
Protein, UA: NEGATIVE
RBC UA: NEGATIVE
Spec Grav, UA: 1.01 (ref 1.010–1.025)
Urobilinogen, UA: 0.2 E.U./dL
pH, UA: 6 (ref 5.0–8.0)

## 2016-11-07 MED ORDER — POLYETHYLENE GLYCOL 3350 17 GM/SCOOP PO POWD: 1.0000 | Freq: Once | ORAL | 0 refills | Status: AC

## 2016-11-07 MED ORDER — BUTALBITAL-APAP-CAFFEINE 50-325-40 MG PO CAPS: 1.0000 | ORAL_CAPSULE | Freq: Four times a day (QID) | ORAL | 3 refills | Status: DC | PRN

## 2016-11-07 NOTE — Progress Notes (Signed)
NOB physical- pt is doing well, having some depression, pt c/o insomnia would like to discuss

## 2016-11-07 NOTE — Patient Instructions (Signed)

## 2016-11-07 NOTE — Progress Notes (Signed)
NEW OB HISTORY AND PHYSICAL  SUBJECTIVE:       Christine Arellano is a 26 y.o. [phone] female, Patient's last menstrual period was 08/05/2016., Estimated Date of Delivery: 05/12/17, [redacted]w[redacted]d, presents today for establishment of Prenatal Care. She has no unusual complaints and complains of having trouble sleeping- took benedryl and it helps. Headaches have returned- took [place] last pregnancy.  Constipation has returned.    Gynecologic History Patient's last menstrual period was 08/05/2016. Normal Contraception: none Last Pap: 2017. Results were: normal  Obstetric History OB History  Gravida Para Term Preterm AB Living  4 2 2   1 1   SAB TAB Ectopic Multiple Live Births  1     0 1    # Outcome Date GA Lbr Len/2nd Weight Sex Delivery Anes PTL Lv  4 Current           3 Term 02/24/15 [redacted]w[redacted]d  6 lb 10.5 oz (3.019 kg) M CS-LTranv Spinal    2 SAB 02/2014          1 Term 04/21/13    M CS-Unspec   LIV     Complications: Fetal Intolerance      Past Medical History:  Diagnosis Date  . Depression   . GERD (gastroesophageal reflux disease)   . Insomnia     Past Surgical History:  Procedure Laterality Date  . arthroscopic knee  2008  . CESAREAN SECTION    . CESAREAN SECTION N/A 02/24/2015   Procedure: CESAREAN SECTION;  Surgeon: Rubie Maid, MD;  Location: [facility] ORS;  Service: Obstetrics;  Laterality: N/A;  . HEMORROIDECTOMY    . TONSILLECTOMY      Current Outpatient Prescriptions on File Prior to Visit  Medication Sig Dispense Refill  . Biotin 5000 MCG CAPS Take by mouth.    . Prenatal Vit-Fe Fumarate-FA (MULTIVITAMIN-PRENATAL) 27-0.8 MG TABS tablet Take 1 tablet by mouth daily at 12 noon.     No current facility-administered medications on file prior to visit.     No Known Allergies  Social History   Social History  . Marital status: Single    Spouse name: N/A  . Number of children: N/A  . Years of education: N/A   Occupational History  . Not on file.   Social History  Main Topics  . Smoking status: Former Research scientist (life sciences)  . Smokeless tobacco: Never Used  . Alcohol use No  . Drug use: No  . Sexual activity: Yes    Birth control/ protection: Pill   Other Topics Concern  . Not on file   Social History Narrative  . No narrative on file    Family History  Problem Relation Age of Onset  . Depression Father   . Cancer Paternal Aunt        breast  . Heart disease Paternal Uncle     The following portions of the patient's history were reviewed and updated as appropriate: allergies, current medications, past OB history, past medical history, past surgical history, past family history, past social history, and problem list.    OBJECTIVE: Initial Physical Exam (New OB)  GENERAL APPEARANCE: alert, well appearing, in no apparent distress, oriented to person, place and time HEAD: normocephalic, atraumatic MOUTH: mucous membranes moist, pharynx normal without lesions and dental hygiene good THYROID: no thyromegaly or masses present BREASTS: not examined LUNGS: clear to auscultation, no wheezes, rales or rhonchi, symmetric air entry HEART: regular rate and rhythm, no murmurs ABDOMEN: soft, nontender, nondistended, no abnormal masses, no epigastric pain,  fundus soft, nontender 13 weeks size and FHT present EXTREMITIES: no redness or tenderness in the calves or thighs SKIN: normal coloration and turgor, no rashes LYMPH NODES: no adenopathy palpable NEUROLOGIC: alert, oriented, normal speech, no focal findings or movement disorder noted  PELVIC EXAM not indicated  ASSESSMENT: Normal pregnancy Previous c/s x2, desires repeat Headaches Chronic constipation Depression   PLAN: Prenatal care See orders

## 2016-11-17 ENCOUNTER — Telehealth: Payer: Self-pay | Admitting: Certified Nurse Midwife

## 2016-11-17 ENCOUNTER — Other Ambulatory Visit: Payer: Self-pay | Admitting: Certified Nurse Midwife

## 2016-11-17 MED ORDER — DOXYLAMINE SUCCINATE (SLEEP) 25 MG PO TABS: 25.0000 mg | ORAL_TABLET | Freq: Every evening | ORAL | 0 refills | Status: DC | PRN

## 2016-11-17 NOTE — Telephone Encounter (Signed)
Patient called wanting to speak with "Deneise Lever" Patient is having a difficult time sleeping and would like to speak with her about it and also get a prescription instead of getting something over the counter. Please advise.

## 2016-11-17 NOTE — Progress Notes (Signed)
Pt called with complaint of difficulty sleeping. Recommend use of Unisom. Prescription sent to pharmacy. Recommend regular sleep routine and no electronics 1 hr before bed.   Philip Aspen, CNM

## 2016-12-05 ENCOUNTER — Ambulatory Visit (INDEPENDENT_AMBULATORY_CARE_PROVIDER_SITE_OTHER): Payer: Medicaid Other | Admitting: Certified Nurse Midwife

## 2016-12-05 ENCOUNTER — Encounter: Payer: Self-pay | Admitting: Certified Nurse Midwife

## 2016-12-05 VITALS — BP 105/61 | HR 78 | Wt 158.7 lb

## 2016-12-05 DIAGNOSIS — Z3492 Encounter for supervision of normal pregnancy, unspecified, second trimester: Secondary | ICD-10-CM

## 2016-12-05 DIAGNOSIS — F5101 Primary insomnia: Secondary | ICD-10-CM

## 2016-12-05 DIAGNOSIS — Z3689 Encounter for other specified antenatal screening: Secondary | ICD-10-CM

## 2016-12-05 LAB — POCT URINALYSIS DIPSTICK
BILIRUBIN UA: NEGATIVE
Blood, UA: NEGATIVE
GLUCOSE UA: NEGATIVE
Ketones, UA: NEGATIVE
LEUKOCYTES UA: NEGATIVE
NITRITE UA: NEGATIVE
Protein, UA: NEGATIVE
Spec Grav, UA: 1.01 (ref 1.010–1.025)
UROBILINOGEN UA: 0.2 U/dL
pH, UA: 6 (ref 5.0–8.0)

## 2016-12-05 MED ORDER — ZOLPIDEM TARTRATE 5 MG PO TABS: 5.0000 mg | ORAL_TABLET | Freq: Every evening | ORAL | 1 refills | Status: DC | PRN

## 2016-12-05 NOTE — Progress Notes (Signed)
ROB-Pt doing well, reports difficulty sleeping and denies relief with home measures. Rx Ambien, see orders. Discussed home treatment measures for round ligament pain and sleep hygiene. Reviewed red flag symptoms and when to call. RTC x 3 weeks for anatomy scan and ROB or sooner if needed.

## 2016-12-05 NOTE — Patient Instructions (Signed)

## 2016-12-13 ENCOUNTER — Telehealth: Payer: Self-pay | Admitting: Certified Nurse Midwife

## 2016-12-13 NOTE — Telephone Encounter (Signed)
Patient lvm stating that she has been having bad acid reflux every night since she has been pregnant and she needs a prescription to help or stop her acid reflux problems. Patient did not disclose any other information. Please advise.

## 2016-12-14 NOTE — Telephone Encounter (Signed)
I spoke with pt and she states she has tried tums without results for heartburn. I went over the list of common medications safe in pregnancy for heartburn. To try Zantac 75mg  po at bedtime, not to lie down x1 hr after taking medication. Refer to her list of medications if this does not help.

## 2016-12-14 NOTE — Telephone Encounter (Signed)
Patient called again about medication for heartburn   Please call

## 2016-12-22 ENCOUNTER — Telehealth: Payer: Self-pay | Admitting: Obstetrics and Gynecology

## 2016-12-22 NOTE — Telephone Encounter (Signed)
Called pt and advised her she can drop off ua since she does have history of uti, pt is going to try to come in 12/22/16

## 2016-12-22 NOTE — Telephone Encounter (Signed)
Patient call wanting to speak with Amy, Patient is experiencing lower back pain and discomfort and would like to discuss the issue with her. Patient did not disclose any other information. Patient was informed that we have a 24 hr turn around time. Please advise.

## 2016-12-26 ENCOUNTER — Ambulatory Visit (INDEPENDENT_AMBULATORY_CARE_PROVIDER_SITE_OTHER): Payer: Medicaid Other

## 2016-12-26 ENCOUNTER — Ambulatory Visit (INDEPENDENT_AMBULATORY_CARE_PROVIDER_SITE_OTHER): Payer: Medicaid Other | Admitting: Certified Nurse Midwife

## 2016-12-26 VITALS — BP 112/56 | HR 80 | Wt 166.3 lb

## 2016-12-26 DIAGNOSIS — Z3689 Encounter for other specified antenatal screening: Secondary | ICD-10-CM

## 2016-12-26 DIAGNOSIS — Z3492 Encounter for supervision of normal pregnancy, unspecified, second trimester: Secondary | ICD-10-CM | POA: Diagnosis not present

## 2016-12-26 LAB — POCT URINALYSIS DIPSTICK
BILIRUBIN UA: NEGATIVE
Blood, UA: NEGATIVE
Glucose, UA: NEGATIVE
Ketones, UA: NEGATIVE
LEUKOCYTES UA: NEGATIVE
NITRITE UA: NEGATIVE
Protein, UA: NEGATIVE
Spec Grav, UA: 1.025 (ref 1.010–1.025)
UROBILINOGEN UA: 0.2 U/dL
pH, UA: 6 (ref 5.0–8.0)

## 2016-12-26 MED ORDER — RANITIDINE HCL 75 MG PO TABS: 75.0000 mg | ORAL_TABLET | Freq: Every day | ORAL | 3 refills | Status: DC

## 2016-12-26 NOTE — Progress Notes (Signed)
ROB, doing well. Complains of shooting pian down her leg. Discussed Sciatic pain and self help measures. She request referral for chiropractor. Order placed. She is also having indigestion. Zantac ordered. Ultrasound today: Normal anatomy scan ( see below). She will follow up 4 wks.  Philip Aspen, CNM  ULTRASOUND REPORT  Location: [facility] Date of Service: 12/26/16  Indications:Anatomy U/S Findings:  Nelda Marse[place] intrauterine pregnancy is visualized with FHR at 155 BPM. Biometrics give an (U/S) Gestational age of 12 5/7 weeks and an (U/S) EDD of 05/10/17; this correlates with the clinically established EDD of 05/12/17.  Fetal presentation is Vertex.  EFW: 388g (14oz). Placenta: Posterior, grade 0, 3.1 cm from internal os. AFI: Adequate with MVP of 3.6 cm.  Anatomic survey is complete and normal; Gender - female  .   Ovaries are not seen Survey of the adnexa demonstrates no adnexal masses. There is no free peritoneal fluid in the cul de sac.  Impression: 1. 20 5/7 week Viable Singleton Intrauterine pregnancy by U/S. 2. (U/S) EDD is consistent with Clinically established (LMP) EDD of 05/12/17. 3. Normal Anatomy Scan  Recommendations: 1.Clinical correlation with the patient's History and Physical Exam.   Christine Arellano

## 2016-12-26 NOTE — Patient Instructions (Signed)
How a Baby Grows During Pregnancy Pregnancy begins when a female's sperm enters a female's egg (fertilization). This happens in one of the tubes (fallopian tubes) that connect the ovaries to the womb (uterus). The fertilized egg is called an embryo until it reaches 10 weeks. From 10 weeks until birth, it is called a fetus. The fertilized egg moves down the fallopian tube to the uterus. Then it implants into the lining of the uterus and begins to grow. The developing fetus receives oxygen and nutrients through the pregnant woman's bloodstream and the tissues that grow (placenta) to support the fetus. The placenta is the life support system for the fetus. It provides nutrition and removes waste. Learning as much as you can about your pregnancy and how your baby is developing can help you enjoy the experience. It can also make you aware of when there might be a problem and when to ask questions. How long does a typical pregnancy last? A pregnancy usually lasts 280 days, or about 40 weeks. Pregnancy is divided into three trimesters:  First trimester: 0-13 weeks.  Second trimester: 14-27 weeks.  Third trimester: 28-40 weeks.  The day when your baby is considered ready to be born (full term) is your estimated date of delivery. How does my baby develop month by month? First month  The fertilized egg attaches to the inside of the uterus.  Some cells will form the placenta. Others will form the fetus.  The arms, legs, brain, spinal cord, lungs, and heart begin to develop.  At the end of the first month, the heart begins to beat.  Second month  The bones, inner ear, eyelids, hands, and feet form.  The genitals develop.  By the end of 8 weeks, all major organs are developing.  Third month  All of the internal organs are forming.  Teeth develop below the gums.  Bones and muscles begin to grow. The spine can flex.  The skin is transparent.  Fingernails and toenails begin to form.  Arms  and legs continue to grow longer, and hands and feet develop.  The fetus is about 3 in (7.6 cm) long.  Fourth month  The placenta is completely formed.  The external sex organs, neck, outer ear, eyebrows, eyelids, and fingernails are formed.  The fetus can hear, swallow, and move its arms and legs.  The kidneys begin to produce urine.  The skin is covered with a white waxy coating (vernix) and very fine hair (lanugo).  Fifth month  The fetus moves around more and can be felt for the first time (quickening).  The fetus starts to sleep and wake up and may begin to suck its finger.  The nails grow to the end of the fingers.  The organ in the digestive system that makes bile (gallbladder) functions and helps to digest the nutrients.  If your baby is a girl, eggs are present in her ovaries. If your baby is a boy, testicles start to move down into his scrotum.  Sixth month  The lungs are formed, but the fetus is not yet able to breathe.  The eyes open. The brain continues to develop.  Your baby has fingerprints and toe prints. Your baby's hair grows thicker.  At the end of the second trimester, the fetus is about 9 in (22.9 cm) long.  Seventh month  The fetus kicks and stretches.  The eyes are developed enough to sense changes in light.  The hands can make a grasping motion.  The   fetus responds to sound.  Eighth month  All organs and body systems are fully developed and functioning.  Bones harden and taste buds develop. The fetus may hiccup.  Certain areas of the brain are still developing. The skull remains soft.  Ninth month  The fetus gains about  lb (0.23 kg) each week.  The lungs are fully developed.  Patterns of sleep develop.  The fetus's head typically moves into a head-down position (vertex) in the uterus to prepare for birth. If the buttocks move into a vertex position instead, the baby is breech.  The fetus weighs 6-9 lbs (2.72-4.08 kg) and is  19-20 in (48.26-50.8 cm) long.  What can I do to have a healthy pregnancy and help my baby develop? Eating and Drinking  Eat a healthy diet. ? Talk with your health care provider to make sure that you are getting the nutrients that you and your baby need. ? Visit www.choosemyplate.gov to learn about creating a healthy diet.  Gain a healthy amount of weight during pregnancy as advised by your health care provider. This is usually 25-35 pounds. You may need to: ? Gain more if you were underweight before getting pregnant or if you are pregnant with more than one baby. ? Gain less if you were overweight or obese when you got pregnant.  Medicines and Vitamins  Take prenatal vitamins as directed by your health care provider. These include vitamins such as folic acid, iron, calcium, and vitamin D. They are important for healthy development.  Take medicines only as directed by your health care provider. Read labels and ask a pharmacist or your health care provider whether over-the-counter medicines, supplements, and prescription drugs are safe to take during pregnancy.  Activities  Be physically active as advised by your health care provider. Ask your health care provider to recommend activities that are safe for you to do, such as walking or swimming.  Do not participate in strenuous or extreme sports.  Lifestyle  Do not drink alcohol.  Do not use any tobacco products, including cigarettes, chewing tobacco, or electronic cigarettes. If you need help quitting, ask your health care provider.  Do not use illegal drugs.  Safety  Avoid exposure to mercury, lead, or other heavy metals. Ask your health care provider about common sources of these heavy metals.  Avoid listeria infection during pregnancy. Follow these precautions: ? Do not eat soft cheeses or deli meats. ? Do not eat hot dogs unless they have been warmed up to the point of steaming, such as in the microwave oven. ? Do not  drink unpasteurized milk.  Avoid toxoplasmosis infection during pregnancy. Follow these precautions: ? Do not change your cat's litter box, if you have a cat. Ask someone else to do this for you. ? Wear gardening gloves while working in the yard.  General Instructions  Keep all follow-up visits as directed by your health care provider. This is important. This includes prenatal care and screening tests.  Manage any chronic health conditions. Work closely with your health care provider to keep conditions, such as diabetes, under control.  How do I know if my baby is developing well? At each prenatal visit, your health care provider will do several different tests to check on your health and keep track of your baby's development. These include:  Fundal height. ? Your health care provider will measure your growing belly from top to bottom using a tape measure. ? Your health care provider will also feel your belly   to determine your baby's position.  Heartbeat. ? An ultrasound in the first trimester can confirm pregnancy and show a heartbeat, depending on how far along you are. ? Your health care provider will check your baby's heart rate at every prenatal visit. ? As you get closer to your delivery date, you may have regular fetal heart rate monitoring to make sure that your baby is not in distress.  Second trimester ultrasound. ? This ultrasound checks your baby's development. It also indicates your baby's gender.  What should I do if I have concerns about my baby's development? Always talk with your health care provider about any concerns that you may have. This information is not intended to replace advice given to you by your health care provider. Make sure you discuss any questions you have with your health care provider. Document Released: 11/01/2007 Document Revised: 10/21/2015 Document Reviewed: 10/22/2013 [employer] Interactive Patient Education  2018 [employer] Round Ligament  Pain The round ligament is a cord of muscle and tissue that helps to support the uterus. It can become a source of pain during pregnancy if it becomes stretched or twisted as the baby grows. The pain usually begins in the second trimester of pregnancy, and it can come and go until the baby is delivered. It is not a serious problem, and it does not cause harm to the baby. Round ligament pain is usually a short, sharp, and pinching pain, but it can also be a dull, lingering, and aching pain. The pain is felt in the lower side of the abdomen or in the groin. It usually starts deep in the groin and moves up to the outside of the hip area. Pain can occur with:  A sudden change in position.  Rolling over in bed.  Coughing or sneezing.  Physical activity.  Follow these instructions at home: Watch your condition for any changes. Take these steps to help with your pain:  When the pain starts, relax. Then try: ? Sitting down. ? Flexing your knees up to your abdomen. ? Lying on your side with one pillow under your abdomen and another pillow between your legs. ? Sitting in a warm bath for 15-20 minutes or until the pain goes away.  Take over-the-counter and prescription medicines only as told by your health care provider.  Move slowly when you sit and stand.  Avoid long walks if they cause pain.  Stop or lessen your physical activities if they cause pain.  Contact a health care provider if:  Your pain does not go away with treatment.  You feel pain in your back that you did not have before.  Your medicine is not helping. Get help right away if:  You develop a fever or chills.  You develop uterine contractions.  You develop vaginal bleeding.  You develop nausea or vomiting.  You develop diarrhea.  You have pain when you urinate. This information is not intended to replace advice given to you by your health care provider. Make sure you discuss any questions you have with your  health care provider. Document Released: 02/22/2008 Document Revised: 10/21/2015 Document Reviewed: 07/22/2014 [employer] Interactive Patient Education  2018 [employer]  

## 2016-12-29 ENCOUNTER — Telehealth: Payer: Self-pay | Admitting: Obstetrics and Gynecology

## 2016-12-29 ENCOUNTER — Other Ambulatory Visit: Payer: Self-pay | Admitting: *Deleted

## 2016-12-29 MED ORDER — PANTOPRAZOLE SODIUM 20 MG PO TBEC: 20.0000 mg | DELAYED_RELEASE_TABLET | Freq: Every day | ORAL | 3 refills | Status: DC

## 2016-12-29 NOTE — Telephone Encounter (Signed)
New medication sent in.

## 2016-12-29 NOTE — Telephone Encounter (Signed)
Patient called requesting a heartburn script that will be covered by medicaid. Thanks

## 2016-12-29 NOTE — Telephone Encounter (Signed)
Patient called requesting a heartburn medication that will be covered by medicaid.Thanks

## 2017-01-04 ENCOUNTER — Other Ambulatory Visit: Payer: Self-pay | Admitting: *Deleted

## 2017-01-04 DIAGNOSIS — F5101 Primary insomnia: Secondary | ICD-10-CM

## 2017-01-04 DIAGNOSIS — Z3492 Encounter for supervision of normal pregnancy, unspecified, second trimester: Secondary | ICD-10-CM

## 2017-01-04 MED ORDER — BENZOYL PEROXIDE 5 % EX LIQD: Freq: Two times a day (BID) | CUTANEOUS | 6 refills | Status: DC

## 2017-01-04 MED ORDER — ZOLPIDEM TARTRATE 5 MG PO TABS: 5.0000 mg | ORAL_TABLET | Freq: Every evening | ORAL | 1 refills | Status: DC | PRN

## 2017-01-23 ENCOUNTER — Ambulatory Visit (INDEPENDENT_AMBULATORY_CARE_PROVIDER_SITE_OTHER): Payer: Medicaid Other | Admitting: Obstetrics and Gynecology

## 2017-01-23 VITALS — BP 114/60 | HR 74 | Wt 168.0 lb

## 2017-01-23 DIAGNOSIS — Z3492 Encounter for supervision of normal pregnancy, unspecified, second trimester: Secondary | ICD-10-CM

## 2017-01-23 LAB — POCT URINALYSIS DIPSTICK
Bilirubin, UA: NEGATIVE
GLUCOSE UA: NEGATIVE
Ketones, UA: NEGATIVE
Leukocytes, UA: NEGATIVE
Nitrite, UA: NEGATIVE
PROTEIN UA: NEGATIVE
RBC UA: NEGATIVE
Spec Grav, UA: 1.01 (ref 1.010–1.025)
Urobilinogen, UA: 0.2 E.U./dL
pH, UA: 7 (ref 5.0–8.0)

## 2017-01-23 NOTE — Progress Notes (Signed)
ROB- pt is doing well, denies any complaints 

## 2017-01-23 NOTE — Progress Notes (Signed)
ROB-doing well, no concerns. glucola next visit.  ``

## 2017-02-22 ENCOUNTER — Ambulatory Visit (INDEPENDENT_AMBULATORY_CARE_PROVIDER_SITE_OTHER): Payer: Medicaid Other | Admitting: Certified Nurse Midwife

## 2017-02-22 ENCOUNTER — Other Ambulatory Visit: Payer: Medicaid Other

## 2017-02-22 VITALS — BP 107/56 | HR 69 | Wt 168.0 lb

## 2017-02-22 DIAGNOSIS — Z23 Encounter for immunization: Secondary | ICD-10-CM | POA: Diagnosis not present

## 2017-02-22 DIAGNOSIS — Z130 Encounter for screening for diseases of the blood and blood-forming organs and certain disorders involving the immune mechanism: Secondary | ICD-10-CM

## 2017-02-22 DIAGNOSIS — R51 Headache: Secondary | ICD-10-CM

## 2017-02-22 DIAGNOSIS — O26893 Other specified pregnancy related conditions, third trimester: Secondary | ICD-10-CM

## 2017-02-22 DIAGNOSIS — R519 Headache, unspecified: Secondary | ICD-10-CM

## 2017-02-22 DIAGNOSIS — Z131 Encounter for screening for diabetes mellitus: Secondary | ICD-10-CM

## 2017-02-22 DIAGNOSIS — Z3492 Encounter for supervision of normal pregnancy, unspecified, second trimester: Secondary | ICD-10-CM

## 2017-02-22 LAB — POCT URINALYSIS DIPSTICK
Bilirubin, UA: NEGATIVE
Blood, UA: NEGATIVE
Glucose, UA: NEGATIVE
KETONES UA: NEGATIVE
LEUKOCYTES UA: NEGATIVE
Nitrite, UA: NEGATIVE
PH UA: 7 (ref 5.0–8.0)
PROTEIN UA: NEGATIVE
SPEC GRAV UA: 1.015 (ref 1.010–1.025)
UROBILINOGEN UA: 0.2 U/dL

## 2017-02-22 MED ORDER — MAGNESIUM OXIDE 400 MG PO CAPS: 1.0000 | ORAL_CAPSULE | Freq: Two times a day (BID) | ORAL | 3 refills | Status: DC

## 2017-02-22 NOTE — Progress Notes (Signed)
ROB-Pt doing well, glucola and TDaP today. Blood transfusion consent reviewed and signed. Desires repeat c-section, will meet with MDs at 32 weeks to schedule. Discussed home treatment measures for sciatica and headaches in pregnancy. Rx: Magnesium oxide, see orders. Reviewed red flag symptoms and when to call. RTC x 2 weeks for ROB with Deneise Lever.

## 2017-02-22 NOTE — Patient Instructions (Addendum)
Tdap Vaccine (Tetanus, Diphtheria and Pertussis): What You Need to Know 1. Why get vaccinated? Tetanus, diphtheria and pertussis are very serious diseases. Tdap vaccine can protect us from these diseases. And, Tdap vaccine given to pregnant women can protect newborn babies against pertussis. TETANUS (Lockjaw) is rare in the [place] today. It causes painful muscle tightening and stiffness, usually all over the body.  It can lead to tightening of muscles in the head and neck so you can't open your mouth, swallow, or sometimes even breathe. Tetanus kills about 1 out of 10 people who are infected even after receiving the best medical care.  DIPHTHERIA is also rare in the [place] today. It can cause a thick coating to form in the back of the throat.  It can lead to breathing problems, heart failure, paralysis, and death.  PERTUSSIS (Whooping Cough) causes severe coughing spells, which can cause difficulty breathing, vomiting and disturbed sleep.  It can also lead to weight loss, incontinence, and rib fractures. Up to 2 in 100 adolescents and 5 in 100 adults with pertussis are hospitalized or have complications, which could include pneumonia or death.  These diseases are caused by bacteria. Diphtheria and pertussis are spread from person to person through secretions from coughing or sneezing. Tetanus enters the body through cuts, scratches, or wounds. Before vaccines, as many as 200,000 cases of diphtheria, 200,000 cases of pertussis, and hundreds of cases of tetanus, were reported in the [place] each year. Since vaccination began, reports of cases for tetanus and diphtheria have dropped by about 99% and for pertussis by about 80%. 2. Tdap vaccine Tdap vaccine can protect adolescents and adults from tetanus, diphtheria, and pertussis. One dose of Tdap is routinely given at age 11 or 12. People who did not get Tdap at that age should get it as soon as possible. Tdap is especially  important for healthcare professionals and anyone having close contact with a baby younger than 12 months. Pregnant women should get a dose of Tdap during every pregnancy, to protect the newborn from pertussis. Infants are most at risk for severe, life-threatening complications from pertussis. Another vaccine, called Td, protects against tetanus and diphtheria, but not pertussis. A Td booster should be given every 10 years. Tdap may be given as one of these boosters if you have never gotten Tdap before. Tdap may also be given after a severe cut or burn to prevent tetanus infection. Your doctor or the person giving you the vaccine can give you more information. Tdap may safely be given at the same time as other vaccines. 3. Some people should not get this vaccine  A person who has ever had a life-threatening allergic reaction after a previous dose of any diphtheria, tetanus or pertussis containing vaccine, OR has a severe allergy to any part of this vaccine, should not get Tdap vaccine. Tell the person giving the vaccine about any severe allergies.  Anyone who had coma or long repeated seizures within 7 days after a childhood dose of DTP or DTaP, or a previous dose of Tdap, should not get Tdap, unless a cause other than the vaccine was found. They can still get Td.  Talk to your doctor if you: ? have seizures or another nervous system problem, ? had severe pain or swelling after any vaccine containing diphtheria, tetanus or pertussis, ? ever had a condition called Guillain-Barr Syndrome (GBS), ? aren't feeling well on the day the shot is scheduled. 4. Risks With any medicine, including   vaccines, there is a chance of side effects. These are usually mild and go away on their own. Serious reactions are also possible but are rare. Most people who get Tdap vaccine do not have any problems with it. Mild problems following Tdap: (Did not interfere with activities)  Pain where the shot was given (about  3 in 4 adolescents or 2 in 3 adults)  Redness or swelling where the shot was given (about 1 person in 5)  Mild fever of at least 100.4F (up to about 1 in 25 adolescents or 1 in 100 adults)  Headache (about 3 or 4 people in 10)  Tiredness (about 1 person in 3 or 4)  Nausea, vomiting, diarrhea, stomach ache (up to 1 in 4 adolescents or 1 in 10 adults)  Chills, sore joints (about 1 person in 10)  Body aches (about 1 person in 3 or 4)  Rash, swollen glands (uncommon)  Moderate problems following Tdap: (Interfered with activities, but did not require medical attention)  Pain where the shot was given (up to 1 in 5 or 6)  Redness or swelling where the shot was given (up to about 1 in 16 adolescents or 1 in 12 adults)  Fever over 102F (about 1 in 100 adolescents or 1 in 250 adults)  Headache (about 1 in 7 adolescents or 1 in 10 adults)  Nausea, vomiting, diarrhea, stomach ache (up to 1 or 3 people in 100)  Swelling of the entire arm where the shot was given (up to about 1 in 500).  Severe problems following Tdap: (Unable to perform usual activities; required medical attention)  Swelling, severe pain, bleeding and redness in the arm where the shot was given (rare).  Problems that could happen after any vaccine:  People sometimes faint after a medical procedure, including vaccination. Sitting or lying down for about 15 minutes can help prevent fainting, and injuries caused by a fall. Tell your doctor if you feel dizzy, or have vision changes or ringing in the ears.  Some people get severe pain in the shoulder and have difficulty moving the arm where a shot was given. This happens very rarely.  Any medication can cause a severe allergic reaction. Such reactions from a vaccine are very rare, estimated at fewer than 1 in a million doses, and would happen within a few minutes to a few hours after the vaccination. As with any medicine, there is a very remote chance of a vaccine  causing a serious injury or death. The safety of vaccines is always being monitored. For more information, visit: www.cdc.gov/vaccinesafety/ 5. What if there is a serious problem? What should I look for? Look for anything that concerns you, such as signs of a severe allergic reaction, very high fever, or unusual behavior. Signs of a severe allergic reaction can include hives, swelling of the face and throat, difficulty breathing, a fast heartbeat, dizziness, and weakness. These would usually start a few minutes to a few hours after the vaccination. What should I do?  If you think it is a severe allergic reaction or other emergency that can't wait, call 9-1-1 or get the person to the nearest hospital. Otherwise, call your doctor.  Afterward, the reaction should be reported to the Vaccine Adverse Event Reporting System (VAERS). Your doctor might file this report, or you can do it yourself through the VAERS web site at www.vaers.hhs.gov, or by calling 1-800-822-7967. ? VAERS does not give medical advice. 6. The National Vaccine Injury Compensation Program The National   Vaccine Injury Compensation Program (VICP) is a federal program that was created to compensate people who may have been injured by certain vaccines. Persons who believe they may have been injured by a vaccine can learn about the program and about filing a claim by calling (306) 558-6911 or visiting the [place] website at GoldCloset.com.ee. There is a time limit to file a claim for compensation. 7. How can I learn more?  Ask your doctor. He or she can give you the vaccine package insert or suggest other sources of information.  Call your local or state health department.  Contact the Centers for Disease Control and Prevention (CDC): ? Call 2760648023 (1-800-CDC-INFO) or ? Visit CDC's website at http://hunter.com/ CDC Tdap Vaccine VIS (07/22/13) This information is not intended to replace advice given to you by your  health care provider. Make sure you discuss any questions you have with your health care provider. Document Released: 11/14/2011 Document Revised: 02/03/2016 Document Reviewed: 02/03/2016 Elsevier Interactive Patient Education  2017 [employer].  Breastfeeding Deciding to breastfeed is one of the best choices you can make for you and your baby. A change in hormones during pregnancy causes your breast tissue to grow and increases the number and size of your milk ducts. These hormones also allow proteins, sugars, and fats from your blood supply to make breast milk in your milk-producing glands. Hormones prevent breast milk from being released before your baby is born as well as prompt milk flow after birth. Once breastfeeding has begun, thoughts of your baby, as well as his or her sucking or crying, can stimulate the release of milk from your milk-producing glands. Benefits of breastfeeding For Your Baby  Your first milk (colostrum) helps your baby's digestive system function better.  There are antibodies in your milk that help your baby fight off infections.  Your baby has a lower incidence of asthma, allergies, and sudden infant death syndrome.  The nutrients in breast milk are better for your baby than infant formulas and are designed uniquely for your baby's needs.  Breast milk improves your baby's brain development.  Your baby is less likely to develop other conditions, such as childhood obesity, asthma, or type 2 diabetes mellitus.  For You  Breastfeeding helps to create a very special bond between you and your baby.  Breastfeeding is convenient. Breast milk is always available at the correct temperature and costs nothing.  Breastfeeding helps to burn calories and helps you lose the weight gained during pregnancy.  Breastfeeding makes your uterus contract to its prepregnancy size faster and slows bleeding (lochia) after you give birth.  Breastfeeding helps to lower your risk of  developing type 2 diabetes mellitus, osteoporosis, and breast or ovarian cancer later in life.  Signs that your baby is hungry Early Signs of Hunger  Increased alertness or activity.  Stretching.  Movement of the head from side to side.  Movement of the head and opening of the mouth when the corner of the mouth or cheek is stroked (rooting).  Increased sucking sounds, smacking lips, cooing, sighing, or squeaking.  Hand-to-mouth movements.  Increased sucking of fingers or hands.  Late Signs of Hunger  Fussing.  Intermittent crying.  Extreme Signs of Hunger Signs of extreme hunger will require calming and consoling before your baby will be able to breastfeed successfully. Do not wait for the following signs of extreme hunger to occur before you initiate breastfeeding:  Restlessness.  A loud, strong cry.  Screaming.  Breastfeeding basics Breastfeeding Initiation  Find  a comfortable place to sit or lie down, with your neck and back well supported.  Place a pillow or rolled up blanket under your baby to bring him or her to the level of your breast (if you are seated). Nursing pillows are specially designed to help support your arms and your baby while you breastfeed.  Make sure that your baby's abdomen is facing your abdomen.  Gently massage your breast. With your fingertips, massage from your chest wall toward your nipple in a circular motion. This encourages milk flow. You may need to continue this action during the feeding if your milk flows slowly.  Support your breast with 4 fingers underneath and your thumb above your nipple. Make sure your fingers are well away from your nipple and your baby's mouth.  Stroke your baby's lips gently with your finger or nipple.  When your baby's mouth is open wide enough, quickly bring your baby to your breast, placing your entire nipple and as much of the colored area around your nipple (areola) as possible into your baby's  mouth. ? More areola should be visible above your baby's upper lip than below the lower lip. ? Your baby's tongue should be between his or her lower gum and your breast.  Ensure that your baby's mouth is correctly positioned around your nipple (latched). Your baby's lips should create a seal on your breast and be turned out (everted).  It is common for your baby to suck about 2-3 minutes in order to start the flow of breast milk.  Latching Teaching your baby how to latch on to your breast properly is very important. An improper latch can cause nipple pain and decreased milk supply for you and poor weight gain in your baby. Also, if your baby is not latched onto your nipple properly, he or she may swallow some air during feeding. This can make your baby fussy. Burping your baby when you switch breasts during the feeding can help to get rid of the air. However, teaching your baby to latch on properly is still the best way to prevent fussiness from swallowing air while breastfeeding. Signs that your baby has successfully latched on to your nipple:  Silent tugging or silent sucking, without causing you pain.  Swallowing heard between every 3-4 sucks.  Muscle movement above and in front of his or her ears while sucking.  Signs that your baby has not successfully latched on to nipple:  Sucking sounds or smacking sounds from your baby while breastfeeding.  Nipple pain.  If you think your baby has not latched on correctly, slip your finger into the corner of your baby's mouth to break the suction and place it between your baby's gums. Attempt breastfeeding initiation again. Signs of Successful Breastfeeding Signs from your baby:  A gradual decrease in the number of sucks or complete cessation of sucking.  Falling asleep.  Relaxation of his or her body.  Retention of a small amount of milk in his or her mouth.  Letting go of your breast by himself or herself.  Signs from you:  Breasts  that have increased in firmness, weight, and size 1-3 hours after feeding.  Breasts that are softer immediately after breastfeeding.  Increased milk volume, as well as a change in milk consistency and color by the fifth day of breastfeeding.  Nipples that are not sore, cracked, or bleeding.  Signs That Your [employer] is Getting Enough Milk  Wetting at least 1-2 diapers during the first 24 hours  after birth.  Wetting at least 5-6 diapers every 24 hours for the first week after birth. The urine should be clear or pale yellow by 5 days after birth.  Wetting 6-8 diapers every 24 hours as your baby continues to grow and develop.  At least 3 stools in a 24-hour period by age 561 days. The stool should be soft and yellow.  At least 3 stools in a 24-hour period by age 67 days. The stool should be seedy and yellow.  No loss of weight greater than 10% of birth weight during the first 84 days of age.  Average weight gain of 4-7 ounces (113-198 g) per week after age 56 days.  Consistent daily weight gain by age 9 days, without weight loss after the age of 2 weeks.  After a feeding, your baby may spit up a small amount. This is common. Breastfeeding frequency and duration Frequent feeding will help you make more milk and can prevent sore nipples and breast engorgement. Breastfeed when you feel the need to reduce the fullness of your breasts or when your baby shows signs of hunger. This is called "breastfeeding on demand." Avoid introducing a pacifier to your baby while you are working to establish breastfeeding (the first 4-6 weeks after your baby is born). After this time you may choose to use a pacifier. Research has shown that pacifier use during the first year of a baby's life decreases the risk of sudden infant death syndrome (SIDS). Allow your baby to feed on each breast as long as he or she wants. Breastfeed until your baby is finished feeding. When your baby unlatches or falls asleep while feeding from  the first breast, offer the second breast. Because newborns are often sleepy in the first few weeks of life, you may need to awaken your baby to get him or her to feed. Breastfeeding times will vary from baby to baby. However, the following rules can serve as a guide to help you ensure that your baby is properly fed:  Newborns (babies 3 weeks of age or younger) may breastfeed every 1-3 hours.  Newborns should not go longer than 3 hours during the day or 5 hours during the night without breastfeeding.  You should breastfeed your baby a minimum of 8 times in a 24-hour period until you begin to introduce solid foods to your baby at around 105 months of age.  Breast milk pumping Pumping and storing breast milk allows you to ensure that your baby is exclusively fed your breast milk, even at times when you are unable to breastfeed. This is especially important if you are going back to work while you are still breastfeeding or when you are not able to be present during feedings. Your lactation consultant can give you guidelines on how long it is safe to store breast milk. A breast pump is a machine that allows you to pump milk from your breast into a sterile bottle. The pumped breast milk can then be stored in a refrigerator or freezer. Some breast pumps are operated by hand, while others use electricity. Ask your lactation consultant which type will work best for you. Breast pumps can be purchased, but some hospitals and breastfeeding support groups lease breast pumps on a monthly basis. A lactation consultant can teach you how to hand express breast milk, if you prefer not to use a pump. Caring for your breasts while you breastfeed Nipples can become dry, cracked, and sore while breastfeeding. The following recommendations can  help keep your breasts moisturized and healthy:  Avoid using soap on your nipples.  Wear a supportive bra. Although not required, special nursing bras and tank tops are designed to  allow access to your breasts for breastfeeding without taking off your entire bra or top. Avoid wearing underwire-style bras or extremely tight bras.  Air dry your nipples for 3-30minutes after each feeding.  Use only cotton bra pads to absorb leaked breast milk. Leaking of breast milk between feedings is normal.  Use lanolin on your nipples after breastfeeding. Lanolin helps to maintain your skin's normal moisture barrier. If you use pure lanolin, you do not need to wash it off before feeding your baby again. Pure lanolin is not toxic to your baby. You may also hand express a few drops of breast milk and gently massage that milk into your nipples and allow the milk to air dry.  In the first few weeks after giving birth, some women experience extremely full breasts (engorgement). Engorgement can make your breasts feel heavy, warm, and tender to the touch. Engorgement peaks within 3-5 days after you give birth. The following recommendations can help ease engorgement:  Completely empty your breasts while breastfeeding or pumping. You may want to start by applying warm, moist heat (in the shower or with warm water-soaked hand towels) just before feeding or pumping. This increases circulation and helps the milk flow. If your baby does not completely empty your breasts while breastfeeding, pump any extra milk after he or she is finished.  Wear a snug bra (nursing or regular) or tank top for 1-2 days to signal your body to slightly decrease milk production.  Apply ice packs to your breasts, unless this is too uncomfortable for you.  Make sure that your baby is latched on and positioned properly while breastfeeding.  If engorgement persists after 48 hours of following these recommendations, contact your health care provider or a Science writer. Overall health care recommendations while breastfeeding  Eat healthy foods. Alternate between meals and snacks, eating 3 of each per day. Because what you  eat affects your breast milk, some of the foods may make your baby more irritable than usual. Avoid eating these foods if you are sure that they are negatively affecting your baby.  Drink milk, fruit juice, and water to satisfy your thirst (about 10 glasses a day).  Rest often, relax, and continue to take your prenatal vitamins to prevent fatigue, stress, and anemia.  Continue breast self-awareness checks.  Avoid chewing and smoking tobacco. Chemicals from cigarettes that pass into breast milk and exposure to secondhand smoke may harm your baby.  Avoid alcohol and drug use, including marijuana. Some medicines that may be harmful to your baby can pass through breast milk. It is important to ask your health care provider before taking any medicine, including all over-the-counter and prescription medicine as well as vitamin and herbal supplements. It is possible to become pregnant while breastfeeding. If birth control is desired, ask your health care provider about options that will be safe for your baby. Contact a health care provider if:  You feel like you want to stop breastfeeding or have become frustrated with breastfeeding.  You have painful breasts or nipples.  Your nipples are cracked or bleeding.  Your breasts are red, tender, or warm.  You have a swollen area on either breast.  You have a fever or chills.  You have nausea or vomiting.  You have drainage other than breast milk from your nipples.  Your breasts do not become full before feedings by the fifth day after you give birth.  You feel sad and depressed.  Your baby is too sleepy to eat well.  Your baby is having trouble sleeping.  Your baby is wetting less than 3 diapers in a 24-hour period.  Your baby has less than 3 stools in a 24-hour period.  Your baby's skin or the white part of his or her eyes becomes yellow.  Your baby is not gaining weight by 26 days of age. Get help right away if:  Your baby is  overly tired (lethargic) and does not want to wake up and feed.  Your baby develops an unexplained fever. This information is not intended to replace advice given to you by your health care provider. Make sure you discuss any questions you have with your health care provider. Document Released: 05/15/2005 Document Revised: 10/27/2015 Document Reviewed: 11/06/2012 Elsevier Interactive Patient Education  2017 [place] of Pregnancy The third trimester is from week 28 through week 40 (months 7 through 9). The third trimester is a time when the unborn baby (fetus) is growing rapidly. At the end of the ninth month, the fetus is about 20 inches in length and weighs 6-10 pounds. Body changes during your third trimester Your body will continue to go through many changes during pregnancy. The changes vary from woman to woman. During the third trimester:  Your weight will continue to increase. You can expect to gain 25-35 pounds (11-16 kg) by the end of the pregnancy.  You may begin to get stretch marks on your hips, abdomen, and breasts.  You may urinate more often because the fetus is moving lower into your pelvis and pressing on your bladder.  You may develop or continue to have heartburn. This is caused by increased hormones that slow down muscles in the digestive tract.  You may develop or continue to have constipation because increased hormones slow digestion and cause the muscles that push waste through your intestines to relax.  You may develop hemorrhoids. These are swollen veins (varicose veins) in the rectum that can itch or be painful.  You may develop swollen, bulging veins (varicose veins) in your legs.  You may have increased body aches in the pelvis, back, or thighs. This is due to weight gain and increased hormones that are relaxing your joints.  You may have changes in your hair. These can include thickening of your hair, rapid growth, and changes in texture.  Some women also have hair loss during or after pregnancy, or hair that feels dry or thin. Your hair will most likely return to normal after your baby is born.  Your breasts will continue to grow and they will continue to become tender. A yellow fluid (colostrum) may leak from your breasts. This is the first milk you are producing for your baby.  Your belly button may stick out.  You may notice more swelling in your hands, face, or ankles.  You may have increased tingling or numbness in your hands, arms, and legs. The skin on your belly may also feel numb.  You may feel short of breath because of your expanding uterus.  You may have more problems sleeping. This can be caused by the size of your belly, increased need to urinate, and an increase in your body's metabolism.  You may notice the fetus "dropping," or moving lower in your abdomen (lightening).  You may have increased vaginal discharge.  You  may notice your joints feel loose and you may have pain around your pelvic bone.  What to expect at prenatal visits You will have prenatal exams every 2 weeks until week 36. Then you will have weekly prenatal exams. During a routine prenatal visit:  You will be weighed to make sure you and the baby are growing normally.  Your blood pressure will be taken.  Your abdomen will be measured to track your baby's growth.  The fetal heartbeat will be listened to.  Any test results from the previous visit will be discussed.  You may have a cervical check near your due date to see if your cervix has softened or thinned (effaced).  You will be tested for Group B streptococcus. This happens between 35 and 37 weeks.  Your health care provider may ask you:  What your birth plan is.  How you are feeling.  If you are feeling the baby move.  If you have had any abnormal symptoms, such as leaking fluid, bleeding, severe headaches, or abdominal cramping.  If you are using any tobacco products,  including cigarettes, chewing tobacco, and electronic cigarettes.  If you have any questions.  Other tests or screenings that may be performed during your third trimester include:  Blood tests that check for low iron levels (anemia).  Fetal testing to check the health, activity level, and growth of the fetus. Testing is done if you have certain medical conditions or if there are problems during the pregnancy.  Nonstress test (NST). This test checks the health of your baby to make sure there are no signs of problems, such as the baby not getting enough oxygen. During this test, a belt is placed around your belly. The baby is made to move, and its heart rate is monitored during movement.  What is false labor? False labor is a condition in which you feel small, irregular tightenings of the muscles in the womb (contractions) that usually go away with rest, changing position, or drinking water. These are called Braxton Hicks contractions. Contractions may last for hours, days, or even weeks before true labor sets in. If contractions come at regular intervals, become more frequent, increase in intensity, or become painful, you should see your health care provider. What are the signs of labor?  Abdominal cramps.  Regular contractions that start at 10 minutes apart and become stronger and more frequent with time.  Contractions that start on the top of the uterus and spread down to the lower abdomen and back.  Increased pelvic pressure and dull back pain.  A watery or bloody mucus discharge that comes from the vagina.  Leaking of amniotic fluid. This is also known as your "water breaking." It could be a slow trickle or a gush. Let your health care provider know if it has a color or strange odor. If you have any of these signs, call your health care provider right away, even if it is before your due date. Follow these instructions at home: Medicines  Follow your health care provider's  instructions regarding medicine use. Specific medicines may be either safe or unsafe to take during pregnancy.  Take a prenatal vitamin that contains at least 600 micrograms (mcg) of folic acid.  If you develop constipation, try taking a stool softener if your health care provider approves. Eating and drinking  Eat a balanced diet that includes fresh fruits and vegetables, whole grains, good sources of protein such as meat, eggs, or tofu, and low-fat dairy. Your health  care provider will help you determine the amount of weight gain that is right for you.  Avoid raw meat and uncooked cheese. These carry germs that can cause birth defects in the baby.  If you have low calcium intake from food, talk to your health care provider about whether you should take a daily calcium supplement.  Eat four or five small meals rather than three large meals a day.  Limit foods that are high in fat and processed sugars, such as fried and sweet foods.  To prevent constipation: ? Drink enough fluid to keep your urine clear or pale yellow. ? Eat foods that are high in fiber, such as fresh fruits and vegetables, whole grains, and beans. Activity  Exercise only as directed by your health care provider. Most women can continue their usual exercise routine during pregnancy. Try to exercise for 30 minutes at least 5 days a week. Stop exercising if you experience uterine contractions.  Avoid heavy lifting.  Do not exercise in extreme heat or humidity, or at high altitudes.  Wear low-heel, comfortable shoes.  Practice good posture.  You may continue to have sex unless your health care provider tells you otherwise. Relieving pain and discomfort  Take frequent breaks and rest with your legs elevated if you have leg cramps or low back pain.  Take warm sitz baths to soothe any pain or discomfort caused by hemorrhoids. Use hemorrhoid cream if your health care provider approves.  Wear a good support bra to  prevent discomfort from breast tenderness.  If you develop varicose veins: ? Wear support pantyhose or compression stockings as told by your healthcare provider. ? Elevate your feet for 15 minutes, 3-4 times a day. Prenatal care  Write down your questions. Take them to your prenatal visits.  Keep all your prenatal visits as told by your health care provider. This is important. Safety  Wear your seat belt at all times when driving.  Make a list of emergency phone numbers, including numbers for family, friends, the hospital, and police and fire departments. General instructions  Avoid cat litter boxes and soil used by cats. These carry germs that can cause birth defects in the baby. If you have a cat, ask someone to clean the litter box for you.  Do not travel far distances unless it is absolutely necessary and only with the approval of your health care provider.  Do not use hot tubs, steam rooms, or saunas.  Do not drink alcohol.  Do not use any products that contain nicotine or tobacco, such as cigarettes and e-cigarettes. If you need help quitting, ask your health care provider.  Do not use any medicinal herbs or unprescribed drugs. These chemicals affect the formation and growth of the baby.  Do not douche or use tampons or scented sanitary pads.  Do not cross your legs for long periods of time.  To prepare for the arrival of your baby: ? Take prenatal classes to understand, practice, and ask questions about labor and delivery. ? Make a trial run to the hospital. ? Visit the hospital and tour the maternity area. ? Arrange for maternity or paternity leave through employers. ? Arrange for family and friends to take care of pets while you are in the hospital. ? Purchase a rear-facing car seat and make sure you know how to install it in your car. ? Pack your hospital bag. ? Prepare the baby's nursery. Make sure to remove all pillows and stuffed animals from the baby's  crib to  prevent suffocation.  Visit your dentist if you have not gone during your pregnancy. Use a soft toothbrush to brush your teeth and be gentle when you floss. Contact a health care provider if:  You are unsure if you are in labor or if your water has broken.  You become dizzy.  You have mild pelvic cramps, pelvic pressure, or nagging pain in your abdominal area.  You have lower back pain.  You have persistent nausea, vomiting, or diarrhea.  You have an unusual or bad smelling vaginal discharge.  You have pain when you urinate. Get help right away if:  Your water breaks before 37 weeks.  You have regular contractions less than 5 minutes apart before 37 weeks.  You have a fever.  You are leaking fluid from your vagina.  You have spotting or bleeding from your vagina.  You have severe abdominal pain or cramping.  You have rapid weight loss or weight gain.  You have shortness of breath with chest pain.  You notice sudden or extreme swelling of your face, hands, ankles, feet, or legs.  Your baby makes fewer than 10 movements in 2 hours.  You have severe headaches that do not go away when you take medicine.  You have vision changes. Summary  The third trimester is from week 28 through week 40, months 7 through 9. The third trimester is a time when the unborn baby (fetus) is growing rapidly.  During the third trimester, your discomfort may increase as you and your baby continue to gain weight. You may have abdominal, leg, and back pain, sleeping problems, and an increased need to urinate.  During the third trimester your breasts will keep growing and they will continue to become tender. A yellow fluid (colostrum) may leak from your breasts. This is the first milk you are producing for your baby.  False labor is a condition in which you feel small, irregular tightenings of the muscles in the womb (contractions) that eventually go away. These are called Braxton Hicks  contractions. Contractions may last for hours, days, or even weeks before true labor sets in.  Signs of labor can include: abdominal cramps; regular contractions that start at 10 minutes apart and become stronger and more frequent with time; watery or bloody mucus discharge that comes from the vagina; increased pelvic pressure and dull back pain; and leaking of amniotic fluid. This information is not intended to replace advice given to you by your health care provider. Make sure you discuss any questions you have with your health care provider. Document Released: 05/09/2001 Document Revised: 10/21/2015 Document Reviewed: 07/16/2012 Elsevier Interactive Patient Education  2017 [employer].  Cesarean Delivery Cesarean birth, or cesarean delivery, is the surgical delivery of a baby through an incision in the abdomen and the uterus. This may be referred to as a C-section. This procedure may be scheduled ahead of time, or it may be done in an emergency situation. Tell a health care provider about:  Any allergies you have.  All medicines you are taking, including vitamins, herbs, eye drops, creams, and over-the-counter medicines.  Any problems you or family members have had with anesthetic medicines.  Any blood disorders you have.  Any surgeries you have had.  Any medical conditions you have.  Whether you or any members of your family have a history of deep vein thrombosis (DVT) or pulmonary embolism (PE). What are the risks? Generally, this is a safe procedure. However, problems may occur, including:  Infection.  Bleeding.  Allergic reactions to medicines.  Damage to other structures or organs.  Blood clots.  Injury to your baby.  What happens before the procedure?  Follow instructions from your health care provider about eating or drinking restrictions.  Follow instructions from your health care provider about bathing before your procedure to help reduce your risk of  infection.  If you know that you are going to have a cesarean delivery, do not shave your pubic area. Shaving before the procedure may increase your risk of infection.  Ask your health care provider about: ? Changing or stopping your regular medicines. This is especially important if you are taking diabetes medicines or blood thinners. ? Your pain management plan. This is especially important if you plan to breastfeed your baby. ? How long you will be in the hospital after the procedure. ? Any concerns you may have about receiving blood products if you need them during the procedure. ? Cord blood banking, if you plan to collect your baby's umbilical cord blood.  You may also want to ask your health care provider: ? Whether you will be able to hold or breastfeed your baby while you are still in the operating room. ? Whether your baby can stay with you immediately after the procedure and during your recovery. ? Whether a family member or a person of your choice can go with you into the operating room and stay with you during the procedure, immediately after the procedure, and during your recovery.  Plan to have someone drive you home when you are discharged from the hospital. What happens during the procedure?  Fetal monitors will be placed on your abdomen to monitor your heart rate and your baby's heart rate.  Depending on the reason for your cesarean delivery, you may have a physical exam or additional testing, such as an ultrasound.  An IV tube will be inserted into one of your veins.  You may have your blood or urine tested.  You will be given antibiotic medicine to help prevent infection.  You may be given a special warming gown to wear to keep your temperature stable.  Hair may be removed from your pubic area.  The skin of your pubic area and lower abdomen will be cleaned with a germ-killing solution (antiseptic).  A catheter may be inserted into your bladder through your  urethra. This drains your urine during the procedure.  You may be given one or more of the following: ? A medicine to numb the area (local anesthetic). ? A medicine to make you fall asleep (general anesthetic). ? A medicine (regional anesthetic) that is injected into your back or through a small thin tube placed in your back (spinal anesthetic or epidural anesthetic). This numbs everything below the injection site and allows you to stay awake during your procedure. If this makes you feel nauseous, tell your health care provider. Medicines will be available to help reduce any nausea you may feel.  An incision will be made in your abdomen, and then in your uterus.  If you are awake during your procedure, you may feel tugging and pulling in your abdomen, but you should not feel pain. If you feel pain, tell your health care provider immediately.  Your baby will be removed from your uterus. You may feel more pressure or pushing while this happens.  Immediately after birth, your baby will be dried and kept warm. You may be able to hold and breastfeed your baby. The umbilical  cord may be clamped and cut during this time.  Your placenta will be removed from your uterus.  Your incisions will be closed with stitches (sutures). Staples, skin glue, or adhesive strips may also be applied to the incision in your abdomen.  Bandages (dressings) will be placed over the incision in your abdomen. The procedure may vary among health care providers and hospitals. What happens after the procedure?  Your blood pressure, heart rate, breathing rate, and blood oxygen level will be monitored often until the medicines you were given have worn off.  You may continue to receive fluids and medicines through an IV tube.  You will have some pain. Medicines will be available to help control your pain.  To help prevent blood clots: ? You may be given medicines. ? You may have to wear compression stockings or  devices. ? You will be encouraged to walk around when you are able.  Hospital staff will encourage and support bonding with your baby. Your hospital may allow you and your baby to stay in the same room (rooming in) during your hospital stay to encourage successful breastfeeding.  You may be encouraged to cough and breathe deeply often. This helps to prevent lung problems.  If you have a catheter draining your urine, it will be removed as soon as possible after your procedure. This information is not intended to replace advice given to you by your health care provider. Make sure you discuss any questions you have with your health care provider. Document Released: 05/15/2005 Document Revised: 10/21/2015 Document Reviewed: 02/23/2015 Elsevier Interactive Patient Education  2017 [place].  Cord Blood Banking Information Cord blood banking is the process of collecting and storing the blood that is in the umbilical cord and placenta at the time of delivery. This blood contains stem cells, which can be used to treat many blood diseases, immune system disorders, and childhood cancers. Stem cells can also be used to research certain diseases and treatments. Many people who choose cord blood banking donate the blood. Donated blood can be used in lifesaving treatments or for research. Other people choose to store the blood privately. Blood that is stored privately can only be used with the person's permission. This option is often chosen if:  A family member needs a stem cell transplant.  The child is part of an ethnic minority.  The child was conceived through in vitro fertilization.  What should I look for in a blood bank? A blood bank is the organization that coordinates cord blood banking. Make sure the cord blood bank that you use:  Is accredited.  Is financially stable.  Handles a large volume of cord blood samples.  Has a procedure in place for transport and storage.  Allows you the  option of transferring your cord blood sample.  Has a procedure in place if the bank goes out of business.  Clearly states all costs and limits to future costs.  People who choose to donate cord blood should not need to pay for blood banking. People who keep the blood for private use will need to pay for the first (initial) storage and pay a fee each year (annual fee). Other fees may also apply. What are the risks of cord blood banking? There are no health risks associated with cord blood banking. It is considered safe. How should I prepare? You must schedule this process at least 4-6 weeks before you will be giving birth. How is the blood collected? The blood is collected  as soon as the baby has been delivered. Within 15 minutes of delivery, a health care provider will take these actions to collect the blood:  Clamp the umbilical cord at the top and bottom. This traps the blood in the umbilical cord.  Use a syringe or bag to collect the blood.  Insert needles into the placenta to collect (draw out) more blood.  What happens after the blood is collected? After the blood has been collected:  The blood will be sent to a blood bank.  The blood will be tested for genetic problems and infectious diseases. If the blood tests positive for a genetic problem or a disease, someone will contact you and let you know.  The blood will be frozen.  If your child develops a genetic condition, immune system disorder, or cancer, you will be responsible for contacting the blood bank and letting them know. This information is not intended to replace advice given to you by your health care provider. Make sure you discuss any questions you have with your health care provider. Document Released: 11/02/2009 Document Revised: 10/21/2015 Document Reviewed: 11/02/2014 Elsevier Interactive Patient Education  2018 [employer].

## 2017-02-23 LAB — CBC WITH DIFFERENTIAL/PLATELET
BASOS ABS: 0 10*3/uL (ref 0.0–0.2)
Basos: 0 %
EOS (ABSOLUTE): 0.1 10*3/uL (ref 0.0–0.4)
EOS: 1 %
Hematocrit: 34.8 % (ref 34.0–46.6)
Hemoglobin: 11.6 g/dL (ref 11.1–15.9)
IMMATURE GRANS (ABS): 0 10*3/uL (ref 0.0–0.1)
IMMATURE GRANULOCYTES: 0 %
LYMPHS: 16 %
Lymphocytes Absolute: 1.2 10*3/uL (ref 0.7–3.1)
MCH: 30.1 pg (ref 26.6–33.0)
MCHC: 33.3 g/dL (ref 31.5–35.7)
MCV: 90 fL (ref 79–97)
MONOCYTES: 5 %
Monocytes Absolute: 0.4 10*3/uL (ref 0.1–0.9)
NEUTROS PCT: 78 %
Neutrophils Absolute: 5.9 10*3/uL (ref 1.4–7.0)
PLATELETS: 187 10*3/uL (ref 150–379)
RBC: 3.85 x10E6/uL (ref 3.77–5.28)
RDW: 13.9 % (ref 12.3–15.4)
WBC: 7.5 10*3/uL (ref 3.4–10.8)

## 2017-02-23 LAB — GLUCOSE, 1 HOUR GESTATIONAL: GESTATIONAL DIABETES SCREEN: 92 mg/dL (ref 65–139)

## 2017-03-05 ENCOUNTER — Other Ambulatory Visit: Payer: Self-pay | Admitting: Certified Nurse Midwife

## 2017-03-05 DIAGNOSIS — F5101 Primary insomnia: Secondary | ICD-10-CM

## 2017-03-05 DIAGNOSIS — Z3492 Encounter for supervision of normal pregnancy, unspecified, second trimester: Secondary | ICD-10-CM

## 2017-03-08 ENCOUNTER — Ambulatory Visit (INDEPENDENT_AMBULATORY_CARE_PROVIDER_SITE_OTHER): Payer: Medicaid Other | Admitting: Obstetrics and Gynecology

## 2017-03-08 VITALS — BP 114/73 | HR 97 | Wt 172.0 lb

## 2017-03-08 DIAGNOSIS — Z3493 Encounter for supervision of normal pregnancy, unspecified, third trimester: Secondary | ICD-10-CM

## 2017-03-08 LAB — POCT URINALYSIS DIPSTICK
BILIRUBIN UA: NEGATIVE
Blood, UA: NEGATIVE
Glucose, UA: NEGATIVE
KETONES UA: NEGATIVE
Leukocytes, UA: NEGATIVE
Nitrite, UA: NEGATIVE
PH UA: 7 (ref 5.0–8.0)
PROTEIN UA: NEGATIVE
SPEC GRAV UA: 1.01 (ref 1.010–1.025)
Urobilinogen, UA: 0.2 E.U./dL

## 2017-03-08 NOTE — Progress Notes (Signed)
ROB- doing well except nasal congestion, recommend Tylenol Cold & sinus. Desires repeat c/s on 05/09/17. Will see Dr Marcelline Mates next visit. Plans breast feeding

## 2017-03-08 NOTE — Patient Instructions (Signed)
Tylenol cold and sinus Saline nasal spray Will get this years flu vaccine next visit.

## 2017-03-08 NOTE — Progress Notes (Signed)
ROB- pt is doing well, pt is having sinus drainage, cough non productive

## 2017-03-20 ENCOUNTER — Encounter: Payer: Medicaid Other | Admitting: Obstetrics and Gynecology

## 2017-03-27 ENCOUNTER — Encounter: Payer: Self-pay | Admitting: Obstetrics and Gynecology

## 2017-03-27 ENCOUNTER — Ambulatory Visit (INDEPENDENT_AMBULATORY_CARE_PROVIDER_SITE_OTHER): Payer: Medicaid Other | Admitting: Obstetrics and Gynecology

## 2017-03-27 VITALS — BP 103/62 | HR 78 | Wt 175.4 lb

## 2017-03-27 DIAGNOSIS — Z98891 History of uterine scar from previous surgery: Secondary | ICD-10-CM

## 2017-03-27 DIAGNOSIS — Z3483 Encounter for supervision of other normal pregnancy, third trimester: Secondary | ICD-10-CM

## 2017-03-27 LAB — POCT URINALYSIS DIPSTICK
Bilirubin, UA: NEGATIVE
Glucose, UA: NEGATIVE
KETONES UA: NEGATIVE
LEUKOCYTES UA: NEGATIVE
Nitrite, UA: NEGATIVE
PH UA: 8 (ref 5.0–8.0)
PROTEIN UA: NEGATIVE
RBC UA: NEGATIVE
SPEC GRAV UA: 1.01 (ref 1.010–1.025)
Urobilinogen, UA: 0.2 E.U./dL

## 2017-03-27 NOTE — Progress Notes (Signed)
ROB- Pt states she is doing well no complaints as of today

## 2017-03-27 NOTE — Progress Notes (Signed)
ROB: Doing well, no complaints. Presents from midwives for discussion of repeat C-section. Has h/o prior of C-section x 2, desiring repeat. Desires date of 05/09/17. Discussed risks/benefits of repeat C-section.  Patient to return at 38 weeks for pre-op with MD, otherwise can continue with midwifery care. Patient brought FMLA forms to be completed today. RTC in 2 weeks.

## 2017-04-03 ENCOUNTER — Ambulatory Visit (INDEPENDENT_AMBULATORY_CARE_PROVIDER_SITE_OTHER): Payer: Medicaid Other | Admitting: Certified Nurse Midwife

## 2017-04-03 VITALS — BP 116/68 | HR 80 | Wt 179.2 lb

## 2017-04-03 DIAGNOSIS — F5101 Primary insomnia: Secondary | ICD-10-CM

## 2017-04-03 DIAGNOSIS — Z3493 Encounter for supervision of normal pregnancy, unspecified, third trimester: Secondary | ICD-10-CM | POA: Diagnosis not present

## 2017-04-03 DIAGNOSIS — Z3492 Encounter for supervision of normal pregnancy, unspecified, second trimester: Secondary | ICD-10-CM

## 2017-04-03 LAB — POCT URINALYSIS DIPSTICK
BILIRUBIN UA: NEGATIVE
Blood, UA: NEGATIVE
GLUCOSE UA: NEGATIVE
Ketones, UA: NEGATIVE
Leukocytes, UA: NEGATIVE
NITRITE UA: NEGATIVE
Protein, UA: NEGATIVE
Spec Grav, UA: 1.015 (ref 1.010–1.025)
UROBILINOGEN UA: 0.2 U/dL
pH, UA: 6.5 (ref 5.0–8.0)

## 2017-04-03 MED ORDER — ZOLPIDEM TARTRATE 5 MG PO TABS: 5.0000 mg | ORAL_TABLET | Freq: Every evening | ORAL | 1 refills | Status: DC | PRN

## 2017-04-03 NOTE — Progress Notes (Signed)
ROB-Reports irritated skin tag to upper abdomen. Edges regular, round, flesh colored, size of pinhead . Discussed options with pt, decision to removed during the postpartum period. Desires rpt cesarean section on 12/12, Dr. Marcelline Mates and Deneise Lever, CNM on call that day-information shared with pt. Rx. Ambien, see orders. Reviewed red flag symptoms and when to call. RTC x 2 weeks for 36 week cultures and ROB with Deneise Lever or sooner if needed.

## 2017-04-03 NOTE — Progress Notes (Signed)
ROB- pt is doing well, has a spot on her upper abd she would like checked, pt would like refill on her [place]

## 2017-04-03 NOTE — Patient Instructions (Signed)
Cesarean Delivery Cesarean birth, or cesarean delivery, is the surgical delivery of a baby through an incision in the abdomen and the uterus. This may be referred to as a C-section. This procedure may be scheduled ahead of time, or it may be done in an emergency situation. Tell a health care provider about:  Any allergies you have.  All medicines you are taking, including vitamins, herbs, eye drops, creams, and over-the-counter medicines.  Any problems you or family members have had with anesthetic medicines.  Any blood disorders you have.  Any surgeries you have had.  Any medical conditions you have.  Whether you or any members of your family have a history of deep vein thrombosis (DVT) or pulmonary embolism (PE). What are the risks? Generally, this is a safe procedure. However, problems may occur, including:  Infection.  Bleeding.  Allergic reactions to medicines.  Damage to other structures or organs.  Blood clots.  Injury to your baby.  What happens before the procedure?  Follow instructions from your health care provider about eating or drinking restrictions.  Follow instructions from your health care provider about bathing before your procedure to help reduce your risk of infection.  If you know that you are going to have a cesarean delivery, do not shave your pubic area. Shaving before the procedure may increase your risk of infection.  Ask your health care provider about: ? Changing or stopping your regular medicines. This is especially important if you are taking diabetes medicines or blood thinners. ? Your pain management plan. This is especially important if you plan to breastfeed your baby. ? How long you will be in the hospital after the procedure. ? Any concerns you may have about receiving blood products if you need them during the procedure. ? Cord blood banking, if you plan to collect your baby's umbilical cord blood.  You may also want to ask your  health care provider: ? Whether you will be able to hold or breastfeed your baby while you are still in the operating room. ? Whether your baby can stay with you immediately after the procedure and during your recovery. ? Whether a family member or a person of your choice can go with you into the operating room and stay with you during the procedure, immediately after the procedure, and during your recovery.  Plan to have someone drive you home when you are discharged from the hospital. What happens during the procedure?  Fetal monitors will be placed on your abdomen to monitor your heart rate and your baby's heart rate.  Depending on the reason for your cesarean delivery, you may have a physical exam or additional testing, such as an ultrasound.  An IV tube will be inserted into one of your veins.  You may have your blood or urine tested.  You will be given antibiotic medicine to help prevent infection.  You may be given a special warming gown to wear to keep your temperature stable.  Hair may be removed from your pubic area.  The skin of your pubic area and lower abdomen will be cleaned with a germ-killing solution (antiseptic).  A catheter may be inserted into your bladder through your urethra. This drains your urine during the procedure.  You may be given one or more of the following: ? A medicine to numb the area (local anesthetic). ? A medicine to make you fall asleep (general anesthetic). ? A medicine (regional anesthetic) that is injected into your back or through a small   thin tube placed in your back (spinal anesthetic or epidural anesthetic). This numbs everything below the injection site and allows you to stay awake during your procedure. If this makes you feel nauseous, tell your health care provider. Medicines will be available to help reduce any nausea you may feel.  An incision will be made in your abdomen, and then in your uterus.  If you are awake during your  procedure, you may feel tugging and pulling in your abdomen, but you should not feel pain. If you feel pain, tell your health care provider immediately.  Your baby will be removed from your uterus. You may feel more pressure or pushing while this happens.  Immediately after birth, your baby will be dried and kept warm. You may be able to hold and breastfeed your baby. The umbilical cord may be clamped and cut during this time.  Your placenta will be removed from your uterus.  Your incisions will be closed with stitches (sutures). Staples, skin glue, or adhesive strips may also be applied to the incision in your abdomen.  Bandages (dressings) will be placed over the incision in your abdomen. The procedure may vary among health care providers and hospitals. What happens after the procedure?  Your blood pressure, heart rate, breathing rate, and blood oxygen level will be monitored often until the medicines you were given have worn off.  You may continue to receive fluids and medicines through an IV tube.  You will have some pain. Medicines will be available to help control your pain.  To help prevent blood clots: ? You may be given medicines. ? You may have to wear compression stockings or devices. ? You will be encouraged to walk around when you are able.  Hospital staff will encourage and support bonding with your baby. Your hospital may allow you and your baby to stay in the same room (rooming in) during your hospital stay to encourage successful breastfeeding.  You may be encouraged to cough and breathe deeply often. This helps to prevent lung problems.  If you have a catheter draining your urine, it will be removed as soon as possible after your procedure. This information is not intended to replace advice given to you by your health care provider. Make sure you discuss any questions you have with your health care provider. Document Released: 05/15/2005 Document Revised: 10/21/2015  Document Reviewed: 02/23/2015 Elsevier Interactive Patient Education  2017 [employer].

## 2017-04-17 ENCOUNTER — Ambulatory Visit (INDEPENDENT_AMBULATORY_CARE_PROVIDER_SITE_OTHER): Payer: Medicaid Other | Admitting: Obstetrics and Gynecology

## 2017-04-17 VITALS — BP 118/66 | HR 77 | Wt 183.7 lb

## 2017-04-17 DIAGNOSIS — Z3493 Encounter for supervision of normal pregnancy, unspecified, third trimester: Secondary | ICD-10-CM

## 2017-04-17 DIAGNOSIS — Z3685 Encounter for antenatal screening for Streptococcus B: Secondary | ICD-10-CM

## 2017-04-17 DIAGNOSIS — Z113 Encounter for screening for infections with a predominantly sexual mode of transmission: Secondary | ICD-10-CM

## 2017-04-17 LAB — POCT URINALYSIS DIPSTICK
BILIRUBIN UA: NEGATIVE
Blood, UA: NEGATIVE
GLUCOSE UA: NEGATIVE
KETONES UA: 5
LEUKOCYTES UA: NEGATIVE
Nitrite, UA: NEGATIVE
Protein, UA: NEGATIVE
Spec Grav, UA: 1.01 (ref 1.010–1.025)
Urobilinogen, UA: 0.2 E.U./dL
pH, UA: 7 (ref 5.0–8.0)

## 2017-04-17 NOTE — Progress Notes (Signed)
ROB and cultures obtained.

## 2017-04-17 NOTE — Progress Notes (Signed)
ROB- cultures obtained, pt is still having issues with her sciatic nerve, numbness in her hands and feet

## 2017-04-19 LAB — GC/CHLAMYDIA PROBE AMP
CHLAMYDIA, DNA PROBE: NEGATIVE
NEISSERIA GONORRHOEAE BY PCR: NEGATIVE

## 2017-04-19 LAB — STREP GP B NAA: STREP GROUP B AG: NEGATIVE

## 2017-04-25 ENCOUNTER — Ambulatory Visit (INDEPENDENT_AMBULATORY_CARE_PROVIDER_SITE_OTHER): Payer: Medicaid Other | Admitting: Obstetrics and Gynecology

## 2017-04-25 VITALS — BP 119/71 | HR 81 | Wt 182.3 lb

## 2017-04-25 DIAGNOSIS — Z3493 Encounter for supervision of normal pregnancy, unspecified, third trimester: Secondary | ICD-10-CM

## 2017-04-25 LAB — POCT URINALYSIS DIPSTICK
Bilirubin, UA: NEGATIVE
Blood, UA: NEGATIVE
Glucose, UA: NEGATIVE
KETONES UA: NEGATIVE
LEUKOCYTES UA: NEGATIVE
Nitrite, UA: NEGATIVE
Protein, UA: NEGATIVE
SPEC GRAV UA: 1.01 (ref 1.010–1.025)
Urobilinogen, UA: 0.2 E.U./dL
pH, UA: 7 (ref 5.0–8.0)

## 2017-04-25 NOTE — Progress Notes (Signed)
ROB- pt is having pelvic pressure, some back pain

## 2017-04-25 NOTE — Progress Notes (Signed)
ROB- doing well, discussed negative cultures.

## 2017-05-01 ENCOUNTER — Ambulatory Visit (INDEPENDENT_AMBULATORY_CARE_PROVIDER_SITE_OTHER): Payer: Medicaid Other | Admitting: Obstetrics and Gynecology

## 2017-05-01 VITALS — BP 106/59 | HR 91 | Wt 184.1 lb

## 2017-05-01 DIAGNOSIS — Z3493 Encounter for supervision of normal pregnancy, unspecified, third trimester: Secondary | ICD-10-CM

## 2017-05-01 LAB — POCT URINALYSIS DIPSTICK
Bilirubin, UA: NEGATIVE
Glucose, UA: NEGATIVE
KETONES UA: NEGATIVE
Leukocytes, UA: NEGATIVE
Nitrite, UA: NEGATIVE
PH UA: 6.5 (ref 5.0–8.0)
PROTEIN UA: NEGATIVE
RBC UA: NEGATIVE
SPEC GRAV UA: 1.015 (ref 1.010–1.025)
UROBILINOGEN UA: 0.2 U/dL

## 2017-05-01 NOTE — Progress Notes (Signed)
ROB- pt is doing well 

## 2017-05-01 NOTE — Progress Notes (Signed)
ROB-doing well, schedule c/s next week.

## 2017-05-08 ENCOUNTER — Other Ambulatory Visit: Payer: Self-pay

## 2017-05-08 ENCOUNTER — Encounter
Admission: RE | Admit: 2017-05-08 | Discharge: 2017-05-08 | Disposition: A | Discharge status: Home / Self Care | Payer: Medicaid Other | Source: Ambulatory Visit | Attending: Obstetrics and Gynecology | Admitting: Obstetrics and Gynecology

## 2017-05-08 DIAGNOSIS — Z01812 Encounter for preprocedural laboratory examination: Secondary | ICD-10-CM | POA: Insufficient documentation

## 2017-05-08 HISTORY — DX: Anxiety disorder, unspecified: F41.9

## 2017-05-08 HISTORY — DX: Personal history of other diseases of the digestive system: Z87.19

## 2017-05-08 HISTORY — DX: Headache: R51

## 2017-05-08 HISTORY — DX: Headache, unspecified: R51.9

## 2017-05-08 LAB — TYPE AND SCREEN
ABO/RH(D): A POS
Antibody Screen: NEGATIVE
Extend sample reason: UNDETERMINED

## 2017-05-08 NOTE — Patient Instructions (Signed)
Your procedure is scheduled UX:[id number] 12, 2018 (Wednesday ) Report to EMERGENCY DEPARTMENT ARRIVAL TIME 10:00 AM  Remember: Instructions that are not followed completely may result in serious medical risk, up to and including death, or upon the discretion of your surgeon and anesthesiologist your surgery may need to be rescheduled.     _X__ 1. Do not eat food after midnight the night before your procedure.                 No gum chewing or hard candies. You may drink clear liquids up to 2 hours                 before you are scheduled to arrive for your surgery- DO not drink clear                 liquids within 2 hours of the start of your surgery.                 Clear Liquids include:  water, apple juice without pulp, clear carbohydrate                 drink such as Clearfast of Gartorade, Black Coffee or Tea (Do not add                 anything to coffee or tea).     _X__ 2.  No Alcohol for 24 hours before or after surgery.   _X__ 3.  Do Not Smoke or use e-cigarettes For 24 Hours Prior to Your Surgery.                 Do not use any chewable tobacco products for at least 6 hours prior to                 surgery.  ____  4.  Bring all medications with you on the day of surgery if instructed.   _X___  5.  Notify your doctor if there is any change in your medical condition      (cold, fever, infections).     Do not wear jewelry, make-up, hairpins, clips or nail polish. Do not wear lotions, powders, or perfumes. Do not shave 48 hours prior to surgery. Men may shave face and neck. Do not bring valuables to the hospital.    [facility] is not responsible for any belongings or valuables.  Contacts, dentures or bridgework may not be worn into surgery. Leave your suitcase in the car. After surgery it may be brought to your room. For patients admitted to the hospital, discharge time is determined by your treatment team.   Patients discharged the day of surgery  will not be allowed to drive home.   Please read over the following fact sheets that you were given:           ____ Take these medicines the morning of surgery with A SIP OF WATER:    1.   2.   3.   4.  5.  6.  ____ Fleet Enema (as directed)   _X___ Use CHG Soap as directed  ____ Use inhalers on the day of surgery  ____ Stop metformin 2 days prior to surgery    ____ Take 1/2 of usual insulin dose the night before surgery. No insulin the morning          of surgery.   _X___ Stop Coumadin/Plavix/aspirin on (NO ASPIRIN )  _X___ Stop Anti-inflammatories on (NO ASPIRIN PRODUCTS )  ____ Stop supplements until after surgery.    ____ Bring C-Pap to the hospital.

## 2017-05-09 ENCOUNTER — Inpatient Hospital Stay
Admission: EM | Admit: 2017-05-09 | Discharge: 2017-05-12 | DRG: 788 | Disposition: A | Discharge status: Home / Self Care | Payer: Medicaid Other | Attending: Obstetrics and Gynecology | Admitting: Obstetrics and Gynecology

## 2017-05-09 ENCOUNTER — Encounter: Payer: Self-pay | Admitting: Anesthesiology

## 2017-05-09 ENCOUNTER — Inpatient Hospital Stay: Payer: Medicaid Other | Admitting: Anesthesiology

## 2017-05-09 ENCOUNTER — Inpatient Hospital Stay
Admission: RE | Admit: 2017-05-09 | Payer: Medicaid Other | Source: Ambulatory Visit | Admitting: Obstetrics and Gynecology

## 2017-05-09 ENCOUNTER — Other Ambulatory Visit: Payer: Self-pay

## 2017-05-09 ENCOUNTER — Encounter
Admission: EM | Disposition: A | Discharge status: Home / Self Care | Payer: Self-pay | Source: Home / Self Care | Attending: Obstetrics and Gynecology

## 2017-05-09 DIAGNOSIS — Z87891 Personal history of nicotine dependence: Secondary | ICD-10-CM

## 2017-05-09 DIAGNOSIS — O9962 Diseases of the digestive system complicating childbirth: Secondary | ICD-10-CM | POA: Diagnosis present

## 2017-05-09 DIAGNOSIS — Z98891 History of uterine scar from previous surgery: Secondary | ICD-10-CM

## 2017-05-09 DIAGNOSIS — Z302 Encounter for sterilization: Secondary | ICD-10-CM | POA: Diagnosis not present

## 2017-05-09 DIAGNOSIS — K219 Gastro-esophageal reflux disease without esophagitis: Secondary | ICD-10-CM | POA: Diagnosis present

## 2017-05-09 DIAGNOSIS — Z3A39 39 weeks gestation of pregnancy: Secondary | ICD-10-CM

## 2017-05-09 DIAGNOSIS — O34211 Maternal care for low transverse scar from previous cesarean delivery: Secondary | ICD-10-CM | POA: Diagnosis present

## 2017-05-09 LAB — RAPID HIV SCREEN (HIV 1/2 AB+AG)
HIV 1/2 Antibodies: NONREACTIVE
HIV-1 P24 Antigen - HIV24: NONREACTIVE

## 2017-05-09 LAB — RPR: RPR Ser Ql: NONREACTIVE

## 2017-05-09 LAB — CBC
HCT: 36.5 % (ref 35.0–47.0)
Hemoglobin: 12.7 g/dL (ref 12.0–16.0)
MCH: 30.4 pg (ref 26.0–34.0)
MCHC: 34.7 g/dL (ref 32.0–36.0)
MCV: 87.8 fL (ref 80.0–100.0)
PLATELETS: 244 10*3/uL (ref 150–440)
RBC: 4.16 MIL/uL (ref 3.80–5.20)
RDW: 13.3 % (ref 11.5–14.5)
WBC: 9.6 10*3/uL (ref 3.6–11.0)

## 2017-05-09 SURGERY — Surgical Case
Anesthesia: Spinal

## 2017-05-09 MED ORDER — CEFAZOLIN SODIUM-DEXTROSE 2-4 GM/100ML-% IV SOLN
2.0000 g | INTRAVENOUS | Status: DC
  Filled 2017-05-09: qty 100

## 2017-05-09 MED ORDER — KETOROLAC TROMETHAMINE 30 MG/ML IJ SOLN
30.0000 mg | Freq: Four times a day (QID) | INTRAMUSCULAR | Status: AC | PRN
  Administered 2017-05-09: 30 mg via INTRAVENOUS
  Filled 2017-05-09: qty 1

## 2017-05-09 MED ORDER — SENNOSIDES-DOCUSATE SODIUM 8.6-50 MG PO TABS
2.0000 | ORAL_TABLET | ORAL | Status: DC
  Administered 2017-05-09 – 2017-05-11 (×2): 2 via ORAL
  Filled 2017-05-09: qty 2

## 2017-05-09 MED ORDER — WITCH HAZEL-GLYCERIN EX PADS: 1.0000 "application " | MEDICATED_PAD | CUTANEOUS | Status: DC | PRN

## 2017-05-09 MED ORDER — NALBUPHINE HCL 10 MG/ML IJ SOLN: 5.0000 mg | Freq: Once | INTRAMUSCULAR | Status: DC | PRN

## 2017-05-09 MED ORDER — ONDANSETRON HCL 4 MG/2ML IJ SOLN
INTRAMUSCULAR | Status: DC | PRN
  Administered 2017-05-09: 4 mg via INTRAVENOUS

## 2017-05-09 MED ORDER — COCONUT OIL OIL
1.0000 "application " | TOPICAL_OIL | Status: DC | PRN
  Administered 2017-05-10: 1 via TOPICAL
  Filled 2017-05-09: qty 120

## 2017-05-09 MED ORDER — LACTATED RINGERS IV SOLN
INTRAVENOUS | Status: DC
  Administered 2017-05-09 – 2017-05-10 (×2): via INTRAVENOUS

## 2017-05-09 MED ORDER — LACTATED RINGERS IV SOLN: INTRAVENOUS | Status: DC

## 2017-05-09 MED ORDER — LIDOCAINE 5 % EX PTCH
1.0000 | MEDICATED_PATCH | CUTANEOUS | Status: DC
  Administered 2017-05-10 – 2017-05-11 (×2): 1 via TRANSDERMAL
  Filled 2017-05-09 (×3): qty 1

## 2017-05-09 MED ORDER — KETOROLAC TROMETHAMINE 30 MG/ML IJ SOLN: 30.0000 mg | Freq: Four times a day (QID) | INTRAMUSCULAR | Status: AC | PRN

## 2017-05-09 MED ORDER — KETOROLAC TROMETHAMINE 30 MG/ML IJ SOLN
INTRAMUSCULAR | Status: DC | PRN
  Administered 2017-05-09: 30 mg via INTRAVENOUS

## 2017-05-09 MED ORDER — PHENYLEPHRINE HCL 10 MG/ML IJ SOLN
INTRAMUSCULAR | Status: DC | PRN
  Administered 2017-05-09: 300 ug via INTRAVENOUS
  Administered 2017-05-09 (×2): 200 ug via INTRAVENOUS

## 2017-05-09 MED ORDER — MEPERIDINE HCL 25 MG/ML IJ SOLN: 6.2500 mg | INTRAMUSCULAR | Status: DC | PRN

## 2017-05-09 MED ORDER — OXYTOCIN 40 UNITS IN LACTATED RINGERS INFUSION - SIMPLE MED
INTRAVENOUS | Status: AC
  Filled 2017-05-09: qty 1000

## 2017-05-09 MED ORDER — DEXTROSE 5 % IV SOLN
2.0000 g | Freq: Once | INTRAVENOUS | Status: AC
  Administered 2017-05-09: 2 g via INTRAVENOUS
  Filled 2017-05-09: qty 20

## 2017-05-09 MED ORDER — LACTATED RINGERS IV SOLN
Freq: Once | INTRAVENOUS | Status: AC
  Administered 2017-05-09: 11:00:00 via INTRAVENOUS

## 2017-05-09 MED ORDER — ZOLPIDEM TARTRATE 5 MG PO TABS: 5.0000 mg | ORAL_TABLET | Freq: Every evening | ORAL | Status: DC | PRN

## 2017-05-09 MED ORDER — OXYTOCIN 40 UNITS IN LACTATED RINGERS INFUSION - SIMPLE MED
INTRAVENOUS | Status: DC | PRN
  Administered 2017-05-09: 1000 mL via INTRAVENOUS

## 2017-05-09 MED ORDER — OXYCODONE HCL 5 MG PO TABS: 5.0000 mg | ORAL_TABLET | Freq: Once | ORAL | Status: DC | PRN

## 2017-05-09 MED ORDER — FENTANYL CITRATE (PF) 100 MCG/2ML IJ SOLN
INTRAMUSCULAR | Status: DC | PRN
  Administered 2017-05-09: 15 ug via INTRATHECAL

## 2017-05-09 MED ORDER — OXYCODONE-ACETAMINOPHEN 5-325 MG PO TABS
1.0000 | ORAL_TABLET | ORAL | Status: DC | PRN
  Administered 2017-05-10 – 2017-05-12 (×4): 1 via ORAL
  Filled 2017-05-09 (×4): qty 1

## 2017-05-09 MED ORDER — ACETAMINOPHEN 500 MG PO TABS
1000.0000 mg | ORAL_TABLET | Freq: Three times a day (TID) | ORAL | Status: AC
  Administered 2017-05-09 – 2017-05-10 (×3): 1000 mg via ORAL
  Filled 2017-05-09 (×3): qty 2

## 2017-05-09 MED ORDER — OXYCODONE-ACETAMINOPHEN 5-325 MG PO TABS
2.0000 | ORAL_TABLET | ORAL | Status: DC | PRN
  Administered 2017-05-11 – 2017-05-12 (×5): 2 via ORAL
  Filled 2017-05-09 (×5): qty 2

## 2017-05-09 MED ORDER — EPHEDRINE SULFATE 50 MG/ML IJ SOLN
INTRAMUSCULAR | Status: DC | PRN
  Administered 2017-05-09: 50 mg via INTRAMUSCULAR

## 2017-05-09 MED ORDER — FENTANYL CITRATE (PF) 100 MCG/2ML IJ SOLN: 25.0000 ug | INTRAMUSCULAR | Status: DC | PRN

## 2017-05-09 MED ORDER — LIDOCAINE 5 % EX PTCH
MEDICATED_PATCH | CUTANEOUS | Status: DC | PRN
  Administered 2017-05-09: 1 via TRANSDERMAL

## 2017-05-09 MED ORDER — SOD CITRATE-CITRIC ACID 500-334 MG/5ML PO SOLN
30.0000 mL | ORAL | Status: AC
  Administered 2017-05-09: 30 mL via ORAL
  Filled 2017-05-09: qty 15

## 2017-05-09 MED ORDER — SIMETHICONE 80 MG PO CHEW
80.0000 mg | CHEWABLE_TABLET | ORAL | Status: DC | PRN
  Administered 2017-05-10 – 2017-05-11 (×2): 80 mg via ORAL
  Filled 2017-05-09 (×2): qty 1

## 2017-05-09 MED ORDER — MENTHOL 3 MG MT LOZG
1.0000 | LOZENGE | OROMUCOSAL | Status: DC | PRN
  Filled 2017-05-09: qty 9

## 2017-05-09 MED ORDER — LACTATED RINGERS IV SOLN
INTRAVENOUS | Status: DC
  Administered 2017-05-09: 12:00:00 via INTRAVENOUS

## 2017-05-09 MED ORDER — NALOXONE HCL 0.4 MG/ML IJ SOLN: 0.4000 mg | INTRAMUSCULAR | Status: DC | PRN

## 2017-05-09 MED ORDER — BUPIVACAINE IN DEXTROSE 0.75-8.25 % IT SOLN
INTRATHECAL | Status: DC | PRN
  Administered 2017-05-09: 1.6 mL via INTRATHECAL

## 2017-05-09 MED ORDER — ONDANSETRON HCL 4 MG/2ML IJ SOLN: 4.0000 mg | Freq: Three times a day (TID) | INTRAMUSCULAR | Status: DC | PRN

## 2017-05-09 MED ORDER — OXYTOCIN 40 UNITS IN LACTATED RINGERS INFUSION - SIMPLE MED
2.5000 [IU]/h | INTRAVENOUS | Status: AC
  Filled 2017-05-09: qty 1000

## 2017-05-09 MED ORDER — DIPHENHYDRAMINE HCL 50 MG/ML IJ SOLN: 12.5000 mg | INTRAMUSCULAR | Status: DC | PRN

## 2017-05-09 MED ORDER — DIPHENHYDRAMINE HCL 25 MG PO CAPS: 25.0000 mg | ORAL_CAPSULE | Freq: Four times a day (QID) | ORAL | Status: DC | PRN

## 2017-05-09 MED ORDER — FERROUS SULFATE 325 (65 FE) MG PO TABS
325.0000 mg | ORAL_TABLET | Freq: Two times a day (BID) | ORAL | Status: DC
  Administered 2017-05-09 – 2017-05-12 (×6): 325 mg via ORAL
  Filled 2017-05-09 (×6): qty 1

## 2017-05-09 MED ORDER — PRENATAL MULTIVITAMIN CH
1.0000 | ORAL_TABLET | Freq: Every day | ORAL | Status: DC
  Administered 2017-05-10 – 2017-05-12 (×3): 1 via ORAL
  Filled 2017-05-09 (×3): qty 1

## 2017-05-09 MED ORDER — NALBUPHINE HCL 10 MG/ML IJ SOLN: 5.0000 mg | INTRAMUSCULAR | Status: DC | PRN

## 2017-05-09 MED ORDER — OXYCODONE HCL 5 MG/5ML PO SOLN: 5.0000 mg | Freq: Once | ORAL | Status: DC | PRN

## 2017-05-09 MED ORDER — DIBUCAINE 1 % RE OINT: 1.0000 "application " | TOPICAL_OINTMENT | RECTAL | Status: DC | PRN

## 2017-05-09 MED ORDER — DIPHENHYDRAMINE HCL 25 MG PO CAPS: 25.0000 mg | ORAL_CAPSULE | ORAL | Status: DC | PRN

## 2017-05-09 MED ORDER — SIMETHICONE 80 MG PO CHEW
80.0000 mg | CHEWABLE_TABLET | ORAL | Status: DC
  Administered 2017-05-09 – 2017-05-11 (×3): 80 mg via ORAL
  Filled 2017-05-09 (×3): qty 1

## 2017-05-09 MED ORDER — MORPHINE SULFATE (PF) 0.5 MG/ML IJ SOLN
INTRAMUSCULAR | Status: DC | PRN
  Administered 2017-05-09: .1 mg via INTRATHECAL

## 2017-05-09 MED ORDER — ACETAMINOPHEN 325 MG PO TABS: 650.0000 mg | ORAL_TABLET | ORAL | Status: DC | PRN

## 2017-05-09 MED ORDER — IBUPROFEN 800 MG PO TABS
800.0000 mg | ORAL_TABLET | Freq: Four times a day (QID) | ORAL | Status: DC
  Administered 2017-05-09 – 2017-05-12 (×10): 800 mg via ORAL
  Filled 2017-05-09 (×10): qty 1

## 2017-05-09 MED ORDER — SODIUM CHLORIDE 0.9% FLUSH: 3.0000 mL | INTRAVENOUS | Status: DC | PRN

## 2017-05-09 MED ORDER — MAGNESIUM HYDROXIDE 400 MG/5ML PO SUSP: 30.0000 mL | ORAL | Status: DC | PRN

## 2017-05-09 SURGICAL SUPPLY — 23 items
BAG COUNTER SPONGE EZ (MISCELLANEOUS) ×2 IMPLANT
CANISTER SUCT 3000ML PPV (MISCELLANEOUS) ×3 IMPLANT
CHLORAPREP W/TINT 26ML (MISCELLANEOUS) ×6 IMPLANT
COUNTER SPONGE BAG EZ (MISCELLANEOUS) ×1
DRSG TELFA 3X8 NADH (GAUZE/BANDAGES/DRESSINGS) ×3 IMPLANT
ELECT REM PT RETURN 9FT ADLT (ELECTROSURGICAL) ×3
ELECTRODE REM PT RTRN 9FT ADLT (ELECTROSURGICAL) ×1 IMPLANT
GAUZE SPONGE 4X4 12PLY STRL (GAUZE/BANDAGES/DRESSINGS) ×3 IMPLANT
GLOVE BIO SURGEON STRL SZ 6.5 (GLOVE) ×2 IMPLANT
GLOVE BIO SURGEONS STRL SZ 6.5 (GLOVE) ×1
GLOVE INDICATOR 7.0 STRL GRN (GLOVE) ×3 IMPLANT
GOWN STRL REUS W/ TWL LRG LVL3 (GOWN DISPOSABLE) ×2 IMPLANT
GOWN STRL REUS W/TWL LRG LVL3 (GOWN DISPOSABLE) ×4
KIT RM TURNOVER STRD PROC AR (KITS) ×3 IMPLANT
NS IRRIG 1000ML POUR BTL (IV SOLUTION) ×3 IMPLANT
PACK C SECTION AR (MISCELLANEOUS) ×3 IMPLANT
PAD OB MATERNITY 4.3X12.25 (PERSONAL CARE ITEMS) ×3 IMPLANT
PAD PREP 24X41 OB/GYN DISP (PERSONAL CARE ITEMS) ×3 IMPLANT
SUT MNCRL AB 4-0 PS2 18 (SUTURE) ×3 IMPLANT
SUT PLAIN 2 0 XLH (SUTURE) IMPLANT
SUT VIC AB 0 CT1 36 (SUTURE) ×12 IMPLANT
SUT VIC AB 3-0 SH 27 (SUTURE) ×6
SUT VIC AB 3-0 SH 27X BRD (SUTURE) ×3 IMPLANT

## 2017-05-09 NOTE — Anesthesia Procedure Notes (Signed)
Spinal  Patient location during procedure: OR Start time: 05/09/2017 12:47 PM End time: 05/09/2017 12:50 PM Staffing Resident/CRNA: Clinton Sawyer, CRNA Preanesthetic Checklist Completed: patient identified, site marked, surgical consent, pre-op evaluation, timeout performed, IV checked, risks and benefits discussed and monitors and equipment checked Spinal Block Patient position: sitting Prep: ChloraPrep Patient monitoring: heart rate, continuous pulse ox and blood pressure Approach: midline Location: L3-4 Injection technique: single-shot Needle Needle type: Pencil-Tip  Needle gauge: 24 G Needle length: 10 cm Assessment Sensory level: T4 Additional Notes Patient tolerated procedure well.  Clear CSF, neg heme, neg parathesia.  1.29ml of 0.75% marcaine with 172mcg duramorph and 15 mcg of fentanyl injected

## 2017-05-09 NOTE — Anesthesia Post-op Follow-up Note (Signed)
Anesthesia QCDR form completed.        

## 2017-05-09 NOTE — H&P (Signed)
Obstetric Preoperative History and Physical  Christine Arellano is a 26 y.o. [phone] with IUP at [redacted]w[redacted]d presenting for presenting for scheduled repeat cesarean section.  No acute concerns.   Prenatal Course Source of Care: [facility] with onset of care at 10 weeks Pregnancy complications or risks: Patient Active Problem List   Diagnosis Date Noted  . Mood disorder ([place]) 03/21/2016  . Depression 05/03/2015  . S/P cesarean section 02/24/2015   She plans to breastfeed She desires oral contraceptives (estrogen/progesterone) for postpartum contraception.   Prenatal labs and studies: ABO, Rh: --/--/A POS (12/11 1417) Antibody: NEG (12/11 1417) Rubella: 2.22 (05/22 1527) RPR: Non Reactive (12/11 1417)  HBsAg: Negative (05/22 1527)  HIV:  Negative  HAL:PFXTKWIO (11/20 1651) 1 hr Glucola  Normal (92) Genetic screening normal Anatomy US normal   Past Medical History:  Diagnosis Date  . Anxiety   . Depression   . GERD (gastroesophageal reflux disease)   . Headache   . History of IBS   . Insomnia     Past Surgical History:  Procedure Laterality Date  . arthroscopic knee Right 2008  . CESAREAN SECTION  2014  . CESAREAN SECTION N/A 02/24/2015   Procedure: CESAREAN SECTION;  Surgeon: Rubie Maid, MD;  Location: [facility] ORS;  Service: Obstetrics;  Laterality: N/A;  . HEMORROIDECTOMY    . TONSILLECTOMY    . WISDOM TOOTH EXTRACTION      OB History  Gravida Para Term Preterm AB Living  4 2 2   1 2   SAB TAB Ectopic Multiple Live Births  1     0 2    # Outcome Date GA Lbr Len/2nd Weight Sex Delivery Anes PTL Lv  4 Current           3 Term 02/24/15 [redacted]w[redacted]d  6 lb 10.5 oz (3.019 kg) M CS-LTranv Spinal  LIV  2 SAB 02/2014          1 Term 04/21/13    M CS-Unspec   LIV     Complications: Fetal Intolerance      Social History   Socioeconomic History  . Marital status: Single    Spouse name: None  . Number of children: None  . Years of education: None  . Highest education  level: None  Social Needs  . Financial resource strain: None  . Food insecurity - worry: None  . Food insecurity - inability: None  . Transportation needs - medical: None  . Transportation needs - non-medical: None  Occupational History  . None  Tobacco Use  . Smoking status: Former Smoker    Packs/day: 1.00    Types: Cigarettes    Last attempt to quit: 08/27/2009    Years since quitting: 7.7  . Smokeless tobacco: Never Used  Substance and Sexual Activity  . Alcohol use: No    Alcohol/week: 0.0 oz  . Drug use: No  . Sexual activity: Yes    Birth control/protection: Pill  Other Topics Concern  . None  Social History Narrative  . None    Family History  Problem Relation Age of Onset  . Depression Father   . Hypertension Father   . Cancer Paternal Aunt        breast  . Heart disease Paternal Uncle     Medications Prior to Admission  Medication Sig Dispense Refill Last Dose  . benzoyl peroxide (BENZOYL PEROXIDE) 5 % external liquid Apply topically 2 (two) times daily. (Patient taking differently: Apply 1 application topically  2 (two) times daily as needed (for acne). ) 142 g 6 05/08/2017 at Unknown time  . Biotin 5000 MCG CAPS Take 5,000 mcg by mouth daily.    05/08/2017 at Unknown time  . Butalbital-APAP-Caffeine 50-325-40 MG capsule Take 1-2 capsules by mouth every 6 (six) hours as needed for headache. 30 capsule 3 Past Month at Unknown time  . pantoprazole (PROTONIX) 20 MG tablet Take 1 tablet (20 mg total) by mouth daily. 30 tablet 3 05/08/2017 at Unknown time  . Prenatal Vit-Fe Fumarate-FA (MULTIVITAMIN-PRENATAL) 27-0.8 MG TABS tablet Take 1 tablet by mouth daily at 12 noon.   05/08/2017 at Unknown time  . zolpidem (AMBIEN) 5 MG tablet Take 1 tablet (5 mg total) at bedtime as needed by mouth for sleep. 15 tablet 1 05/08/2017 at Unknown time  . ranitidine (ZANTAC) 75 MG tablet Take 1 tablet (75 mg total) by mouth daily. (Patient not taking: Reported on 05/01/2017) 30 tablet  3 Not Taking at Unknown time    No Known Allergies  Review of Systems: Negative except for what is mentioned in HPI.  Physical Exam: BP 107/70 (BP Location: Left Arm)   Pulse 83   Temp 97.7 F (36.5 C) (Oral)   Resp 18   Ht 5\' 2"  (1.575 m)   Wt 180 lb (81.6 kg)   LMP 08/05/2016   SpO2 99%   BMI 32.92 kg/m  FHR by Doppler: 150 bpm GENERAL: Well-developed, well-nourished female in no acute distress.  LUNGS: Clear to auscultation bilaterally.  HEART: Regular rate and rhythm. ABDOMEN: Soft, nontender, nondistended, gravid, well-healed Pfannenstiel incision. PELVIC: Deferred EXTREMITIES: Nontender, no edema, 2+ distal pulses.   Pertinent Labs/Studies:   Results for orders placed or performed during the hospital encounter of 05/09/17 (from the past 72 hour(s))  CBC     Status: None   Collection Time: 05/09/17 10:18 AM  Result Value Ref Range   WBC 9.6 3.6 - 11.0 K/uL   RBC 4.16 3.80 - 5.20 MIL/uL   Hemoglobin 12.7 12.0 - 16.0 g/dL   HCT 36.5 35.0 - 47.0 %   MCV 87.8 80.0 - 100.0 fL   MCH 30.4 26.0 - 34.0 pg   MCHC 34.7 32.0 - 36.0 g/dL   RDW 13.3 11.5 - 14.5 %   Platelets 244 150 - 440 K/uL  Rapid HIV screen (HIV 1/2 Ab+Ag)     Status: None   Collection Time: 05/09/17 10:18 AM  Result Value Ref Range   HIV-1 P24 Antigen - HIV24 NON REACTIVE NON REACTIVE   HIV 1/2 Antibodies NON REACTIVE NON REACTIVE   Interpretation (HIV Ag Ab)      A non reactive test result means that HIV 1 or HIV 2 antibodies and HIV 1 p24 antigen were not detected in the specimen.    Assessment and Plan :Christine Arellano is a 26 y.o. G4P2011 at [redacted]w[redacted]d being admitted  for scheduled repeat cesarean section delivery . The patient is understanding of the planned procedure and is aware of and accepting of all surgical risks, including but not limited to: bleeding which may require transfusion or reoperation; infection which may require antibiotics; injury to bowel, bladder, ureters or other surrounding organs  which may require repair; injury to the fetus; need for additional procedures including hysterectomy in the event of life-threatening complications; placental abnormalities wth subsequent pregnancies; incisional problems; blood clot disorders which may require blood thinners;, and other postoperative/anesthesia complications. The patient is in agreement with the proposed plan, and gives informed written  consent for the procedure. All questions have been answered.   Rubie Maid, MD [facility]

## 2017-05-09 NOTE — Anesthesia Preprocedure Evaluation (Signed)
Anesthesia Evaluation  Patient identified by MRN, date of birth, ID band Patient awake    Reviewed: Allergy & Precautions, H&P , NPO status , Patient's Chart, lab work & pertinent test results  History of Anesthesia Complications Negative for: history of anesthetic complications  Airway Mallampati: II  TM Distance: >3 FB Neck ROM: full    Dental  (+) Chipped   Pulmonary former smoker,           Cardiovascular Exercise Tolerance: Good (-) hypertension     Neuro/Psych  Headaches, PSYCHIATRIC DISORDERS Anxiety Depression    GI/Hepatic GERD  Controlled,  Endo/Other    Renal/GU   negative genitourinary   Musculoskeletal   Abdominal   Peds  Hematology negative hematology ROS (+)   Anesthesia Other Findings Past Medical History: No date: Anxiety No date: Depression No date: GERD (gastroesophageal reflux disease) No date: Headache No date: History of IBS No date: Insomnia  Past Surgical History: 2008: arthroscopic knee; Right 2014: CESAREAN SECTION 02/24/2015: CESAREAN SECTION; N/A     Comment:  Procedure: CESAREAN SECTION;  Surgeon: Rubie Maid, MD;              Location: [facility] ORS;  Service: Obstetrics;  Laterality:               N/A; No date: HEMORROIDECTOMY No date: TONSILLECTOMY No date: WISDOM TOOTH EXTRACTION     Reproductive/Obstetrics (+) Pregnancy                             Anesthesia Physical Anesthesia Plan  ASA: II  Anesthesia Plan: Spinal   Post-op Pain Management:    Induction:   PONV Risk Score and Plan:   Airway Management Planned: Natural Airway and Nasal Cannula  Additional Equipment:   Intra-op Plan:   Post-operative Plan:   Informed Consent: I have reviewed the patients History and Physical, chart, labs and discussed the procedure including the risks, benefits and alternatives for the proposed anesthesia with the patient or authorized  representative who has indicated his/her understanding and acceptance.   Dental Advisory Given  Plan Discussed with: Anesthesiologist, CRNA and Surgeon  Anesthesia Plan Comments: (Patient reports no bleeding problems and no anticoagulant use.  Plan for spinal with backup GA  Patient consented for risks of anesthesia including but not limited to:  - adverse reactions to medications - risk of bleeding, infection, nerve damage and headache - risk of failed spinal - damage to teeth, lips or other oral mucosa - sore throat or hoarseness - Damage to heart, brain, lungs or loss of life  Patient voiced understanding.)        Anesthesia Quick Evaluation

## 2017-05-09 NOTE — Transfer of Care (Signed)
Immediate Anesthesia Transfer of Care Note  Patient: Christine Arellano  Procedure(s) Performed: REPEAT CESAREAN SECTION (N/A )  Patient Location: PACU and L&D  Anesthesia Type:Spinal  Level of Consciousness: awake, alert  and oriented  Airway & Oxygen Therapy: Patient Spontanous Breathing  Post-op Assessment: Report given to RN and Post -op Vital signs reviewed and stable  Post vital signs: Reviewed and stable  Last Vitals:  Vitals:   05/09/17 1021  BP: 107/70  Pulse: 83  Resp: 18  Temp: 36.5 C  SpO2: 99%    Last Pain:  Vitals:   05/09/17 1021  TempSrc: Oral  PainSc: 0-No pain         Complications: No apparent anesthesia complications

## 2017-05-09 NOTE — Op Note (Signed)
Cesarean Section Procedure Note  Indications: previous uterine incision prior C-section x 2 (low-transverse)  Pre-operative Diagnosis: 39 week 3 day pregnancy, prior C-section x 2 desiring repeat.  Post-operative Diagnosis: Same  Surgeon: Rubie Maid, MD  Assistants: Philip Aspen, CNM  Procedure: Repeat low transverse Cesarean Section  Anesthesia: Spinal anesthesia  Findings: Female infant, cephalic presentation, 8588 grams, with Apgar scores of 9 at one minute and 9 at five minutes. Intact placenta with 3 vessel cord.  The uterine outline, tubes and ovaries appeared normal.   Procedure Details: The patient was seen in the Holding Room. The risks, benefits, complications, treatment options, and expected outcomes were discussed with the patient.  The patient concurred with the proposed plan, giving informed consent.  The site of surgery properly noted/marked. The patient was taken to the Operating Room, identified as Christine Arellano and the procedure verified as C-Section Delivery. A Time Out was held and the above information confirmed.  After induction of anesthesia, the patient was draped and prepped in the usual sterile manner. Anesthesia was tested and noted to be adequate. A Pfannenstiel incision was made and carried down through the subcutaneous tissue to the fascia. Fascial incision was made and extended transversely. The fascia was separated from the underlying rectus tissue superiorly and inferiorly. The peritoneum was identified and entered. Peritoneal incision was extended longitudinally. The utero-vesical peritoneal reflection was incised transversely and the bladder flap was bluntly freed from the lower uterine segment. A low transverse uterine incision was made. Delivered from cephalic presentation was a 2850 gram Female with Apgar scores of 9 at one minute and 9 at five minutes.After the umbilical cord was clamped and cut cord blood was obtained for evaluation. The placenta  was removed intact and appeared normal. The uterus was exteriorized and cleared of all clots and debris. The uterine outline, tubes and ovaries appeared normal.  The uterine incision was closed with a running locked suture of 0-Vicryl. Hemostasis was observed. Lavage was carried out until clear. The fascia was then reapproximated with a running suture of 0- Vicryl.  A second suture of 0-Vicryl was used to reinforce the right lateral third of the fascial incision as the fascia appeared weakened. The skin was reapproximated with 4-0 Monocryl.  Steri-strips were placed over the incision.   Instrument, sponge, and needle counts were correct prior the abdominal closure and at the conclusion of the case.   Estimated Blood Loss:  500 ml      Drains: foley catheter to gravity drainage, 200 ml clear urine at end of the procedure         Total IV Fluids:  800 ml  Specimens: None          Implants: None         Complications:  None; patient tolerated the procedure well.         Disposition: PACU - hemodynamically stable.         Condition: stable   Rubie Maid, MD [facility]

## 2017-05-10 ENCOUNTER — Encounter: Payer: Self-pay | Admitting: Obstetrics and Gynecology

## 2017-05-10 LAB — CBC
HCT: 31.1 % — ABNORMAL LOW (ref 35.0–47.0)
Hemoglobin: 10.6 g/dL — ABNORMAL LOW (ref 12.0–16.0)
MCH: 30.1 pg (ref 26.0–34.0)
MCHC: 34.1 g/dL (ref 32.0–36.0)
MCV: 88.3 fL (ref 80.0–100.0)
Platelets: 199 10*3/uL (ref 150–440)
RBC: 3.52 MIL/uL — ABNORMAL LOW (ref 3.80–5.20)
RDW: 13.7 % (ref 11.5–14.5)
WBC: 8.8 10*3/uL (ref 3.6–11.0)

## 2017-05-10 NOTE — Anesthesia Post-op Follow-up Note (Signed)
  Anesthesia Pain Follow-up Note  Patient: Christine Arellano  Day #: 1  Date of Follow-up: 05/10/2017 Time: 10:39 AM  Last Vitals:  Vitals:   05/10/17 0409 05/10/17 0757  BP: (!) 96/49 97/63  Pulse: 79 66  Resp: 18 18  Temp: 36.8 C 36.8 C  SpO2: 98% 99%    Level of Consciousness: alert  Pain: moderate   Side Effects:None  Catheter Site Exam:clean, dry     Plan: D/C from anesthesia care at surgeon's request  [place]

## 2017-05-10 NOTE — Anesthesia Postprocedure Evaluation (Signed)
Anesthesia Post Note  Patient: Christine Arellano  Procedure(s) Performed: REPEAT CESAREAN SECTION (N/A )  Patient location during evaluation: Mother Baby Anesthesia Type: Spinal Level of consciousness: oriented and awake and alert Pain management: pain level controlled Vital Signs Assessment: post-procedure vital signs reviewed and stable Respiratory status: spontaneous breathing and respiratory function stable Cardiovascular status: blood pressure returned to baseline and stable Postop Assessment: no headache, no backache and no apparent nausea or vomiting Anesthetic complications: no Comments: Able to ambulate      Last Vitals:  Vitals:   05/10/17 0409 05/10/17 0757  BP: (!) 96/49 97/63  Pulse: 79 66  Resp: 18 18  Temp: 36.8 C 36.8 C  SpO2: 98% 99%    Last Pain:  Vitals:   05/10/17 0730  TempSrc:   PainSc: 1                  Precious Haws Piscitello

## 2017-05-10 NOTE — Progress Notes (Signed)
Progress Note - Cesarean Delivery  Christine Arellano is a 26 y.o. [phone] now PP day 1 s/p C-Section, Low Transverse .   Subjective:  Patient reports no problems with eating, voiding, or their wound  Objective:  Vital signs in last 24 hours: Temp:  [97.5 F (36.4 C)-98.7 F (37.1 C)] 98.2 F (36.8 C) (12/13 0409) Pulse Rate:  [59-83] 79 (12/13 0409) Resp:  [14-26] 18 (12/13 0409) BP: (87-127)/(35-91) 96/49 (12/13 0409) SpO2:  [92 %-100 %] 98 % (12/13 0409) Weight:  [180 lb (81.6 kg)] 180 lb (81.6 kg) (12/12 1021)  Physical Exam:  General: alert, cooperative, appears stated age and no distress Lochia: appropriate Uterine Fundus: firm Incision: dressing has small area marked from yesterday, plan of removal today.  DVT Evaluation: No evidence of DVT seen on physical exam. Negative Homan's sign. No significant calf/ankle edema.    Data Review Recent Labs    05/09/17 1018 05/10/17 0452  HGB 12.7 10.6*  HCT 36.5 31.1*    Assessment:  Active Problems:   S/P cesarean section   Labor and delivery, indication for care   Status post Cesarean section. Doing well postoperatively.     Plan:       Continue current care.    Philip Aspen, CNM  05/10/2017 7:57 AM

## 2017-05-10 NOTE — Progress Notes (Signed)
Postpartum Day # 1: Cesarean Delivery  Subjective: Patient reports no major complaints.  Is tolerating diet, ambulating to restroom.  Voiding without difficulty.  Pain is well controlled with current meds.     Objective: Vital signs in last 24 hours: Temp:  [97.5 F (36.4 C)-98.7 F (37.1 C)] 98.2 F (36.8 C) (12/13 0757) Pulse Rate:  [59-83] 66 (12/13 0757) Resp:  [14-26] 18 (12/13 0757) BP: (87-127)/(35-91) 97/63 (12/13 0757) SpO2:  [92 %-100 %] 99 % (12/13 0757) Weight:  [180 lb (81.6 kg)] 180 lb (81.6 kg) (12/12 1021)  Physical Exam:  General: alert, cooperative and no distress Lungs: clear to auscultation bilaterally Breasts: normal appearance, no masses or tenderness Heart: regular rate and rhythm, S1, S2 normal, no murmur, click, rub or gallop Abdomen: normal findings: bowel sounds normal, no masses palpable and soft.  Appropriately tender to palpation.  Pelvis: Lochia appropriate, Uterine Fundus firm, Incision: healing well, no significant drainage, no dehiscence, no significant erythema.  Bandage with old dry blood present.  Extremities: DVT Evaluation: No evidence of DVT seen on physical exam. egative Homan's sign.  No cords or calf tenderness. No significant calf/ankle edema.  Recent Labs    05/09/17 1018 05/10/17 0452  HGB 12.7 10.6*  HCT 36.5 31.1*    Assessment/Plan: Status post Cesarean section. Doing well postoperatively.  Breastfeeding, Lactation consultant if needed.  Advance diet Continue PO pain management Discontinue IVF once BPs improve and tolerating normal diet.  Currently BPs low normal.  Patient currently asymptomatic.  Continue current care.   Rubie Maid, MD [facility]

## 2017-05-10 NOTE — Lactation Note (Signed)
This note was copied from a baby's chart. Lactation Consultation Note  Patient Name: Christine Arellano [id number] Date: 05/10/2017 Reason for consult: Initial assessment   Maternal Data  Mother has been complaining of pain with her left breast.  She stated that it had been inverted and it is hurting as it comes out more. For this feeding we decided that she cold pump, which she told me hurt less, on that side until the am and then she should try again.   Feeding Feeding Type: Breast Fed Length of feed: 15 min        Interventions: Comfort gels applied    Lactation Tools Discussed/Used :Breast pump and comfort gels.     Consult Status  Ongoing    Daryel November 05/10/2017, 6:10 PM

## 2017-05-11 MED ORDER — PANTOPRAZOLE SODIUM 40 MG PO TBEC
40.0000 mg | DELAYED_RELEASE_TABLET | Freq: Every day | ORAL | Status: DC
  Administered 2017-05-12: 40 mg via ORAL
  Filled 2017-05-11: qty 1

## 2017-05-11 MED ORDER — OXYCODONE HCL 5 MG PO TABS: 5.0000 mg | ORAL_TABLET | ORAL | Status: DC | PRN

## 2017-05-11 MED ORDER — LIDOCAINE 5 % EX PTCH: 1.0000 | MEDICATED_PATCH | CUTANEOUS | Status: DC

## 2017-05-11 NOTE — Progress Notes (Signed)
Progress Note - Cesarean Delivery  Christine Arellano is a 26 y.o. [phone] now PP day 2 s/p C-Section, Low Transverse .   Subjective:  Patient reports no problems with eating, bowel movements, voiding, or their wound  Objective:  Vital signs in last 24 hours: Temp:  [98 F (36.7 C)-98.5 F (36.9 C)] 98.5 F (36.9 C) (12/14 0000) Pulse Rate:  [66-71] 70 (12/14 0000) Resp:  [18-20] 20 (12/14 0000) BP: (92-106)/(53-63) 104/60 (12/14 0000) SpO2:  [97 %-99 %] 97 % (12/13 1500)  Physical Exam:  General: alert, cooperative and appears stated age 36: appropriate Uterine Fundus: firm Incision: healing well, no dehiscence, no significant erythema, small amount of old blood on steri strips. DVT Evaluation: No evidence of DVT seen on physical exam. No cords or calf tenderness. No significant calf/ankle edema.    Data Review Recent Labs    05/09/17 1018 05/10/17 0452  HGB 12.7 10.6*  HCT 36.5 31.1*    Assessment:  Active Problems:   S/P cesarean section   Labor and delivery, indication for care   Status post Cesarean section. Doing well postoperatively.     Plan:       Continue current care.    Philip Aspen, CNM  05/11/2017 7:19 AM

## 2017-05-12 MED ORDER — SIMETHICONE 80 MG PO CHEW: 80.0000 mg | CHEWABLE_TABLET | ORAL | 0 refills | Status: DC | PRN

## 2017-05-12 MED ORDER — LIDOCAINE 5 % EX PTCH: 1.0000 | MEDICATED_PATCH | CUTANEOUS | Status: DC

## 2017-05-12 MED ORDER — OXYCODONE-ACETAMINOPHEN 5-325 MG PO TABS: 1.0000 | ORAL_TABLET | ORAL | 0 refills | Status: DC | PRN

## 2017-05-12 MED ORDER — NORETHINDRONE 0.35 MG PO TABS: 1.0000 | ORAL_TABLET | Freq: Every day | ORAL | 11 refills | Status: DC

## 2017-05-12 NOTE — Final Progress Note (Signed)
Discharge Day SOAP Note:  Subjective:  The patient has no complaints.  She is ambulating well. She is taking PO well. Pain is well controlled with current medications. Patient is urinating without difficulty.   She is passing flatus.    Objective  Vital signs in last 24 hours: BP 105/64 (BP Location: Left Arm)   Pulse 71   Temp 98 F (36.7 C) (Oral)   Resp 18   Ht 5\' 2"  (1.575 m)   Wt 180 lb (81.6 kg)   LMP 08/05/2016   SpO2 98%   Breastfeeding? Unknown   BMI 32.92 kg/m   Physical Exam: Gen: NAD Lungs: Clear bilaterally Bowel sounds present, passing gas and has had bowel movment Abdomen: Soft , non tender. Fundus firm @ U clean, no drainage, healing, Slightly moist due to perspiration, encouraged to keep dry and expose to air  Fundus Fundal Tone: Firm  Lochia Amount: Scant     Data Review Labs: CBC Latest Ref Rng & Units 05/10/2017 05/09/2017 02/22/2017  WBC 3.6 - 11.0 K/uL 8.8 9.6 7.5  Hemoglobin 12.0 - 16.0 g/dL 10.6(L) 12.7 11.6  Hematocrit 35.0 - 47.0 % 31.1(L) 36.5 34.8  Platelets 150 - 440 K/uL 199 244 187   A POS  Assessment:  Active Problems:   S/P cesarean section   Labor and delivery, indication for care   Doing well.  Normal progress as expected.    Plan:  Discharge to home  Modified rest as directed - may slowly resume normal activities with restrictions  as discussed.  Medications as written.  See below for additional.       Discharge Instructions: Per After Visit Summary. Activity: Advance as tolerated. Pelvic rest for 6 weeks.  Also refer to After Visit Summary.  Wound care discussed. Diet: Regular Medications: Allergies as of 05/12/2017   No Known Allergies     Medication List    STOP taking these medications   Butalbital-APAP-Caffeine 50-325-40 MG capsule   zolpidem 5 MG tablet Commonly known as:  AMBIEN     TAKE these medications   benzoyl peroxide 5 % external liquid Commonly known as:  benzoyl peroxide Apply topically  2 (two) times daily. What changed:    how much to take  when to take this  reasons to take this   Biotin 5000 MCG Caps Take 5,000 mcg by mouth daily.   multivitamin-prenatal 27-0.8 MG Tabs tablet Take 1 tablet by mouth daily at 12 noon.   norethindrone 0.35 MG tablet Commonly known as:  MICRONOR,CAMILA,ERRIN Take 1 tablet (0.35 mg total) by mouth daily.   oxyCODONE-acetaminophen 5-325 MG tablet Commonly known as:  PERCOCET/ROXICET Take 1 tablet by mouth every 4 (four) hours as needed (pain scale 4-7).   pantoprazole 20 MG tablet Commonly known as:  PROTONIX Take 1 tablet (20 mg total) by mouth daily.   simethicone 80 MG chewable tablet Commonly known as:  MYLICON Chew 1 tablet (80 mg total) by mouth as needed for flatulence.      Outpatient follow up:  Postpartum contraception: Progestin only pill, order placed to start @ 4 wks postpartum   Discharged Condition: good  Discharged to: home  Newborn Data: Disposition:home with mother  Apgars: APGAR (1 MIN): 9   APGAR (5 MINS): 9   APGAR (10 MINS):    Baby Feeding: Breast  Philip Aspen, CNM  05/12/2017 10:05 AM

## 2017-05-12 NOTE — Progress Notes (Addendum)
Patient discharged home with infant and significant other. Discharge instructions, prescriptions, hygiene kit and follow up appointment given to and reviewed with patient and significant other. Patient verbalized understanding. Escorted out via wheelchair by nursing staff.  

## 2017-05-12 NOTE — Discharge Summary (Signed)
Physician Obstetric Discharge Summary  Patient ID: Christine Arellano MRN: [id number] DOB/AGE: 1990/08/11 26 y.o.   Date of Admission: 05/09/2017  Date of Discharge: 05/12/17  Admitting Diagnosis: Scheduled cesarean section at [redacted]w[redacted]d  Mode of Delivery: repeat cesarean section       low uterine, transverse     Discharge Diagnosis: No other diagnosis   Intrapartum Procedures: Spinal    Post partum procedures: None  Complications: none                        Discharge Day SOAP Note:  Subjective:  The patient has no complaints.  She is ambulating well. She is taking PO well. Pain is well controlled with current medications. Patient is urinating without difficulty.   She is passing flatus.    Objective  Vital signs in last 24 hours: BP 105/64 (BP Location: Left Arm)   Pulse 71   Temp 98 F (36.7 C) (Oral)   Resp 18   Ht 5\' 2"  (1.575 m)   Wt 180 lb (81.6 kg)   LMP 08/05/2016   SpO2 98%   Breastfeeding? Unknown   BMI 32.92 kg/m   Physical Exam: Gen: NAD Lungs: Clear bilaterally Bowel sounds present, passing gas and has had bowel movment Abdomen: Soft , non tender. Fundus firm @ U clean, no drainage, healing, Slightly moist due to perspiration, encouraged to keep dry and expose to air  Fundus Fundal Tone: Firm  Lochia Amount: Scant     Data Review Labs: CBC Latest Ref Rng & Units 05/10/2017 05/09/2017 02/22/2017  WBC 3.6 - 11.0 K/uL 8.8 9.6 7.5  Hemoglobin 12.0 - 16.0 g/dL 10.6(L) 12.7 11.6  Hematocrit 35.0 - 47.0 % 31.1(L) 36.5 34.8  Platelets 150 - 440 K/uL 199 244 187   A POS  Assessment:  Active Problems:   S/P cesarean section   Labor and delivery, indication for care   Doing well.  Normal progress as expected.    Plan:  Discharge to home  Modified rest as directed - may slowly resume normal activities with restrictions  as discussed.  Medications as written.  See below for additional.       Discharge Instructions: Per After Visit  Summary. Activity: Advance as tolerated. Pelvic rest for 6 weeks.  Also refer to After Visit Summary.  Wound care discussed. Diet: Regular Medications: Allergies as of 05/12/2017   No Known Allergies     Medication List    STOP taking these medications   Butalbital-APAP-Caffeine 50-325-40 MG capsule   zolpidem 5 MG tablet Commonly known as:  AMBIEN     TAKE these medications   benzoyl peroxide 5 % external liquid Commonly known as:  benzoyl peroxide Apply topically 2 (two) times daily. What changed:    how much to take  when to take this  reasons to take this   Biotin 5000 MCG Caps Take 5,000 mcg by mouth daily.   multivitamin-prenatal 27-0.8 MG Tabs tablet Take 1 tablet by mouth daily at 12 noon.   norethindrone 0.35 MG tablet Commonly known as:  MICRONOR,CAMILA,ERRIN Take 1 tablet (0.35 mg total) by mouth daily.   oxyCODONE-acetaminophen 5-325 MG tablet Commonly known as:  PERCOCET/ROXICET Take 1 tablet by mouth every 4 (four) hours as needed (pain scale 4-7).   pantoprazole 20 MG tablet Commonly known as:  PROTONIX Take 1 tablet (20 mg total) by mouth daily.   simethicone 80 MG chewable tablet Commonly known as:  MYLICON Chew 1  tablet (80 mg total) by mouth as needed for flatulence.      Outpatient follow up:  Postpartum contraception: Progestin only pill, order placed to start @ 4 wks postpartum   Discharged Condition: good  Discharged to: home  Newborn Data: Disposition:home with mother  Apgars: APGAR (1 MIN): 9   APGAR (5 MINS): 9   APGAR (10 MINS):    Baby Feeding: Breast  Philip Aspen, CNM  05/12/2017 10:05 AM

## 2017-05-16 ENCOUNTER — Ambulatory Visit (INDEPENDENT_AMBULATORY_CARE_PROVIDER_SITE_OTHER): Payer: Medicaid Other | Admitting: Certified Nurse Midwife

## 2017-05-16 ENCOUNTER — Encounter: Payer: Self-pay | Admitting: Certified Nurse Midwife

## 2017-05-16 VITALS — BP 101/61 | HR 71 | Ht 62.0 in | Wt 167.9 lb

## 2017-05-16 DIAGNOSIS — Z9889 Other specified postprocedural states: Secondary | ICD-10-CM

## 2017-05-16 NOTE — Patient Instructions (Signed)
Post-operative Instructions  Activity  During the first few days at home, you will need to listen to your body.  It is important to be up and move around.  It is equally important to rest when you feel tired.  You should not, however, just lie in bed for days.  This will make you stiff and does increase your risk of clot formation right after your surgery.  Try to increase activity level slowly each day.  You may climb stairs. Go slowly at first.  No heavy lifting (>pounds) for at least four weeks.  Heavy household chores like vacuuming, mopping, pushing furniture, and activities that require repetitive reaching should be avoided.  You can cook if it doesn't make you uncomfortable.  You can do laundry, just be careful when taking items in and out of the washer/dryer.  Do not lift a heavy laundry basket.  You can go grocery shopping if it doesn't make you too tired.  In general, you can bend and you can lift light objects but you must listen to your body.  If it hurts, don't do it.  You can drive in 5-73 days after surgery.  This is not a rule but a guideline. If you feel you can can comfortably sit behind the wheel, turn your head, and hit the breakes hard, then you can drive.  If you are taking narcotic pain medication during the day, then you SHOULD NOT DRIVE.  Nothing in the vagina for six weeks, including tampons, douching, or engaging in sexual activity.  You can shower the day you get home from the hospital. No tub baths for one week.  Pain Management  You will have a prescription for medication containing narcotics,  You can take 2 tablets every 4-6 hours as needed for pain.  Only take this when you are hurting, not just every four hours because the directions say you can.  You should find that each day the pain is improved and that you can take less and/or spread out the time between doses.  You will also be given a prescription for Motrin 800 mg or advised to use over the counter  Motrin 200 mg.  Generic Ibuprofen is the same as Motrin.  You can take 800 mg (or 4-200 mg tablets) every 8 with hours. Alternating this with your narcotic pain medication will help you use less pain medication.  You should not exceed taking 2000 mg of Tylenol. This equals either four Vicodin or six Percocet.  You may find that for a day or two you require more medication than stated but this should not continue for several days.  If it does, please call the office so your medication can be changed.  Most narcotic pain medications require a hand written prescription.  If we need to change your medication, enough time is needed for a family member or friend to get to the office to pick up the prescription.  You can use a heating pad. This is especially good for pelvic cramping.  Incision Care  Your incision will either have little small paper strips on the (Steri-strips) or have skin super glue (Dermabond).  Do not pick these off.  They will get loose over the first two weeks after surgery.  When they are about to fall off, you can then remove them.  Use soap and water only to clean the incisions. An antibacterial soap like Dial is good to use.   Once the strips or glue comes off , you  can use Neosporin, if you like.  After about three weeks, if you want to use something to help the scars go away faster; you may use Mederma or Vitamin E.  Use a small amount twice daily on the scars and rub it in good.  If you have any redness or draining from the incision that makes you think there is infection, pleas call the office.  Urination and Bowel Movements  A catheter is used to drain your bladder during the surgery.   You may have some stinging the first few days after surgery when you urinate.  This is normal.  It should go away quickly.  Some will experience bladder spasms for several days after surgery. This feels like a pain at the end of emptying your bladder.  This is not worrisome.  If it is  getting worse or more intense, there are some medications tat can be used short term to help.  You will need to call the office.   Most women will have a bowel movement 2-4 days after surgery.  It takes this long because you will probably d a bowel prep before surgery, because your food intake is often decreased and because narcotics pain medications can cause constipation.  To help prevent constipation, drink lots of fluids and stay hydrated.  You should use a mild stool softener like over the counter Colace (Docusate Sodium) twice daily.  Also, drinking warm liquids like coffee, tea, and hot chocolate can help.  Moving around helps too.  Stop the stool softener when you are having regular bowel movements.  If the constipation is severe, you can progress to using Miralax (an over the counter product that you mix with a small amount of water) or use an enema.  DO NOT USE ANY LAXATIVES.  If you have questions, call the office.  Vaginal Care  You do not need to do anything special.  Just shower normally .  Expect spotting or light bleeding. Lonia Blood is different for everyone and can be as little as spotting for a couple of days after surgery or spotting/light bleeding for weeks.  Do not use tampons.  You will need to wear a mini pad.  Important Reasons to Call the Office  Fever > 100.. This is a real post-operative temperature and you will need to be seen.  Heavy vaginal bleeding   Unusual vaginal discharge or odor.  Redness or drainage from your incisions that look like infection.

## 2017-05-16 NOTE — Progress Notes (Signed)
    OBSTETRICS/GYNECOLOGY POST-OPERATIVE CLINIC VISIT  Subjective:     Christine Arellano is a 26 y.o. female who presents to the clinic 1 weeks status post repeat cesarean section for incision check. Eating a regular diet without difficulty. Bowel movements are normal. Pain is controlled with current analgesics. Medications being used: acetaminophen, ibuprofen (OTC) and perscription meds.  The following portions of the patient's history were reviewed and updated as appropriate: allergies, current medications, past family history, past medical history, past social history, past surgical history and problem list.  Review of Systems Constitutional: negative Eyes: negative Ears, nose, mouth, throat, and face: negative Respiratory: negative Cardiovascular: negative Gastrointestinal: negative Genitourinary:negative Integument/breast: negative Hematologic/lymphatic: negative Musculoskeletal:negative Neurological: negative Behavioral/Psych: negative Endocrine: negative Allergic/Immunologic: negative    Objective:    There were no vitals taken for this visit. General:  alert and no distress  Abdomen: soft, bowel sounds active, non-tender  Incision:   healing well, no drainage, no erythema, no hernia, no seroma, no swelling, no dehiscence, incision well approximated     Assessment:    Doing well postoperatively.   Plan:   1. Continue any current medications. 2. Wound care discussed. 3. Operative findings again reviewed. Pathology report discussed. 4. Activity restrictions: continue with current activity 5. Anticipated return to work: 5-6 wks. 6. Follow up: 5 weeks for PPV    Philip Aspen, CNM [facility]

## 2017-06-11 ENCOUNTER — Telehealth: Payer: Self-pay | Admitting: Certified Nurse Midwife

## 2017-06-11 ENCOUNTER — Telehealth: Payer: Self-pay

## 2017-06-11 NOTE — Telephone Encounter (Signed)
The patient called and stated that she would like to have a nurse call her in regards to insurance still covering prenatal vitamins after birth. Please advise.

## 2017-06-11 NOTE — Telephone Encounter (Signed)
mychart message sent

## 2017-06-11 NOTE — Telephone Encounter (Signed)
Mychart message sent.

## 2017-06-12 ENCOUNTER — Telehealth: Payer: Self-pay | Admitting: Obstetrics and Gynecology

## 2017-06-12 ENCOUNTER — Telehealth: Payer: Self-pay

## 2017-06-12 MED ORDER — PRENATAL 27-0.8 MG PO TABS: 1.0000 | ORAL_TABLET | Freq: Every day | ORAL | 6 refills | Status: DC

## 2017-06-12 NOTE — Telephone Encounter (Signed)
Refill sent.

## 2017-06-12 NOTE — Telephone Encounter (Signed)
The patient called and stated that she would like to have a prescription sent in of prenatal vitamins. The patient stated that as long as the brand is not over the counter her insurance will typically cover it. The patient would like a call back/vm when the prenatal vitamins are sent in. Please advise.

## 2017-06-13 ENCOUNTER — Telehealth: Payer: Self-pay

## 2017-06-13 NOTE — Telephone Encounter (Signed)
Informed pt script for vit sent to pharmacy.

## 2017-06-20 ENCOUNTER — Ambulatory Visit (INDEPENDENT_AMBULATORY_CARE_PROVIDER_SITE_OTHER): Payer: Medicaid Other | Admitting: Certified Nurse Midwife

## 2017-06-20 ENCOUNTER — Encounter: Payer: Self-pay | Admitting: Certified Nurse Midwife

## 2017-06-20 MED ORDER — CITALOPRAM HYDROBROMIDE 20 MG PO TABS: 20.0000 mg | ORAL_TABLET | Freq: Every day | ORAL | 0 refills | Status: DC

## 2017-06-20 NOTE — Progress Notes (Signed)
Subjective:    Christine Arellano is a 27 y.o. [phone] Caucasian female who presents for a postpartum visit. She is 6 weeks postpartum following a repeat cesarean section, low transverse incision at 39.4 gestational weeks. Anesthesia: spinal. I have fully reviewed the prenatal and intrapartum course. Postpartum course has been normal. Baby's course has been normal. Baby is feeding by breast. Bleeding no bleeding. Bowel function is normal. Bladder function is normal. Patient is not sexually active. Last sexual activity: prior to delivery. Contraception method is oral progesterone-only contraceptive. Postpartum depression screening: negative. Score 1.  Last pap 07/11/2016 and was negative.  The following portions of the patient's history were reviewed and updated as appropriate: allergies, current medications, past medical history, past surgical history and problem list.  Review of Systems Pertinent items are noted in HPI.   Vitals:   06/20/17 1138  BP: 116/63  Pulse: 72   No LMP recorded.  Objective:   General:  alert, cooperative and no distress   Breasts:  deferred, no complaints  Lungs: clear to auscultation bilaterally  Heart:  regular rate and rhythm  Abdomen: soft, nontender, c section incision healed    Vulva: normal  Vagina: normal vagina  Cervix:  closed  Corpus: Well-involuted  Adnexa:  Non-palpable  Rectal Exam: no hemorrhoids        Assessment:   Postpartum exam 6 wks s/p repeat c/s Breast feeding Depression screening-negative Contraception counseling   Plan:  : oral progesterone-only contraceptive. She is requesting to start her citalopram again. She states that she stopped during the pregnancy but has a history of depression and admits to feeling down about her weight. Discussed benefits vs. Risk and use with breastfeeding . She verbalizes understanding and agrees. Red flag symptoms reviewed. Instructed pt to notify baby pediatrician of use . She agrees. Discussed diet  and exercise for weight loss and nutritional needs to sustain breastfeeding. She verbalizes understanding. Follow up 1 month for medication check .   Philip Aspen, CNM

## 2017-06-20 NOTE — Progress Notes (Signed)
Pt is here for a post partum visit. Pt has not resumed intercourse. She is breast feeding. Has not had a period.Is using OCPs. Scored 1 on screening.

## 2017-06-20 NOTE — Patient Instructions (Signed)
Postpartum Depression and Baby Blues The postpartum period begins right after the birth of a baby. During this time, there is often a great amount of joy and excitement. It is also a time of many changes in the life of the parents. Regardless of how many times a mother gives birth, each child brings new challenges and dynamics to the family. It is not unusual to have feelings of excitement along with confusing shifts in moods, emotions, and thoughts. All mothers are at risk of developing postpartum depression or the "baby blues." These mood changes can occur right after giving birth, or they may occur many months after giving birth. The baby blues or postpartum depression can be mild or severe. Additionally, postpartum depression can go away rather quickly, or it can be a long-term condition. What are the causes? Raised hormone levels and the rapid drop in those levels are thought to be a main cause of postpartum depression and the baby blues. A number of hormones change during and after pregnancy. Estrogen and progesterone usually decrease right after the delivery of your baby. The levels of thyroid hormone and various cortisol steroids also rapidly drop. Other factors that play a role in these mood changes include major life events and genetics. What increases the risk? If you have any of the following risks for the baby blues or postpartum depression, know what symptoms to watch out for during the postpartum period. Risk factors that may increase the likelihood of getting the baby blues or postpartum depression include:  Having a personal or family history of depression.  Having depression while being pregnant.  Having premenstrual mood issues or mood issues related to oral contraceptives.  Having a lot of life stress.  Having marital conflict.  Lacking a social support network.  Having a baby with special needs.  Having health problems, such as diabetes.  What are the signs or  symptoms? Symptoms of baby blues include:  Brief changes in mood, such as going from extreme happiness to sadness.  Decreased concentration.  Difficulty sleeping.  Crying spells, tearfulness.  Irritability.  Anxiety.  Symptoms of postpartum depression typically begin within the first month after giving birth. These symptoms include:  Difficulty sleeping or excessive sleepiness.  Marked weight loss.  Agitation.  Feelings of worthlessness.  Lack of interest in activity or food.  Postpartum psychosis is a very serious condition and can be dangerous. Fortunately, it is rare. Displaying any of the following symptoms is cause for immediate medical attention. Symptoms of postpartum psychosis include:  Hallucinations and delusions.  Bizarre or disorganized behavior.  Confusion or disorientation.  How is this diagnosed? A diagnosis is made by an evaluation of your symptoms. There are no medical or lab tests that lead to a diagnosis, but there are various questionnaires that a health care provider may use to identify those with the baby blues, postpartum depression, or psychosis. Often, a screening tool called the [place] Postnatal Depression Scale is used to diagnose depression in the postpartum period. How is this treated? The baby blues usually goes away on its own in 1-2 weeks. Social support is often all that is needed. You will be encouraged to get adequate sleep and rest. Occasionally, you may be given medicines to help you sleep. Postpartum depression requires treatment because it can last several months or longer if it is not treated. Treatment may include individual or group therapy, medicine, or both to address any social, physiological, and psychological factors that may play a role in the  depression. Regular exercise, a healthy diet, rest, and social support may also be strongly recommended. Postpartum psychosis is more serious and needs treatment right away.  Hospitalization is often needed. Follow these instructions at home:  Get as much rest as you can. Nap when the baby sleeps.  Exercise regularly. Some women find yoga and walking to be beneficial.  Eat a balanced and nourishing diet.  Do little things that you enjoy. Have a cup of tea, take a bubble bath, read your favorite magazine, or listen to your favorite music.  Avoid alcohol.  Ask for help with household chores, cooking, grocery shopping, or running errands as needed. Do not try to do everything.  Talk to people close to you about how you are feeling. Get support from your partner, family members, friends, or other new moms.  Try to stay positive in how you think. Think about the things you are grateful for.  Do not spend a lot of time alone.  Only take over-the-counter or prescription medicine as directed by your health care provider.  Keep all your postpartum appointments.  Let your health care provider know if you have any concerns. Contact a health care provider if: You are having a reaction to or problems with your medicine. Get help right away if:  You have suicidal feelings.  You think you may harm the baby or someone else. This information is not intended to replace advice given to you by your health care provider. Make sure you discuss any questions you have with your health care provider. Document Released: 02/17/2004 Document Revised: 10/21/2015 Document Reviewed: 02/24/2013 Elsevier Interactive Patient Education  2017 Elsevier Inc.  

## 2017-06-26 ENCOUNTER — Telehealth: Payer: Self-pay | Admitting: Certified Nurse Midwife

## 2017-06-26 NOTE — Telephone Encounter (Signed)
The patient called and stated that she needs a release/clearance to go back to work, The patient stated that she would like to come by some time today to pick it up. No other information was disclosed. Please advise.

## 2017-07-10 ENCOUNTER — Encounter: Payer: Medicaid Other | Admitting: Certified Nurse Midwife

## 2017-07-13 ENCOUNTER — Encounter: Payer: Medicaid Other | Admitting: Obstetrics and Gynecology

## 2017-07-17 ENCOUNTER — Encounter: Payer: Medicaid Other | Admitting: Certified Nurse Midwife

## 2017-08-01 ENCOUNTER — Other Ambulatory Visit: Payer: Self-pay | Admitting: Certified Nurse Midwife

## 2017-08-24 ENCOUNTER — Telehealth: Payer: Self-pay | Admitting: Obstetrics and Gynecology

## 2017-08-24 NOTE — Telephone Encounter (Signed)
The patient called and stated that she has some questions that she would like to over with a nurse. No other information was disclosed. Please advise.

## 2017-08-27 NOTE — Telephone Encounter (Signed)
Pt called states she is having a lot of hair falling out, also states her scalp is hurting, would like to know what to do??

## 2017-08-28 NOTE — Telephone Encounter (Signed)
Very common after delivery, nothing to do for it but give it time.

## 2017-08-29 NOTE — Telephone Encounter (Signed)
Spoke with pt

## 2017-09-04 ENCOUNTER — Encounter: Payer: Self-pay | Admitting: Obstetrics and Gynecology

## 2017-09-04 ENCOUNTER — Ambulatory Visit (INDEPENDENT_AMBULATORY_CARE_PROVIDER_SITE_OTHER): Payer: Medicaid Other | Admitting: Obstetrics and Gynecology

## 2017-09-04 VITALS — BP 121/75 | HR 89 | Ht 62.0 in | Wt 170.8 lb

## 2017-09-04 DIAGNOSIS — Z01419 Encounter for gynecological examination (general) (routine) without abnormal findings: Secondary | ICD-10-CM | POA: Diagnosis not present

## 2017-09-04 DIAGNOSIS — Z0000 Encounter for general adult medical examination without abnormal findings: Secondary | ICD-10-CM

## 2017-09-04 NOTE — Patient Instructions (Addendum)
Preventive Care 18-39 Years, Female Preventive care refers to lifestyle choices and visits with your health care provider that can promote health and wellness. What does preventive care include?  A yearly physical exam. This is also called an annual well check.  Dental exams once or twice a year.  Routine eye exams. Ask your health care provider how often you should have your eyes checked.  Personal lifestyle choices, including: ? Daily care of your teeth and gums. ? Regular physical activity. ? Eating a healthy diet. ? Avoiding tobacco and drug use. ? Limiting alcohol use. ? Practicing safe sex. ? Taking vitamin and mineral supplements as recommended by your health care provider. What happens during an annual well check? The services and screenings done by your health care provider during your annual well check will depend on your age, overall health, lifestyle risk factors, and family history of disease. Counseling Your health care provider may ask you questions about your:  Alcohol use.  Tobacco use.  Drug use.  Emotional well-being.  Home and relationship well-being.  Sexual activity.  Eating habits.  Work and work Statistician.  Method of birth control.  Menstrual cycle.  Pregnancy history.  Screening You may have the following tests or measurements:  Height, weight, and BMI.  Diabetes screening. This is done by checking your blood sugar (glucose) after you have not eaten for a while (fasting).  Blood pressure.  Lipid and cholesterol levels. These may be checked every 5 years starting at age 38.  Skin check.  Hepatitis C blood test.  Hepatitis B blood test.  Sexually transmitted disease (STD) testing.  BRCA-related cancer screening. This may be done if you have a family history of breast, ovarian, tubal, or peritoneal cancers.  Pelvic exam and Pap test. This may be done every 3 years starting at age 38. Starting at age 30, this may be done  every 5 years if you have a Pap test in combination with an HPV test.  Discuss your test results, treatment options, and if necessary, the need for more tests with your health care provider. Vaccines Your health care provider may recommend certain vaccines, such as:  Influenza vaccine. This is recommended every year.  Tetanus, diphtheria, and acellular pertussis (Tdap, Td) vaccine. You may need a Td booster every 10 years.  Varicella vaccine. You may need this if you have not been vaccinated.  HPV vaccine. If you are 39 or younger, you may need three doses over 6 months.  Measles, mumps, and rubella (MMR) vaccine. You may need at least one dose of MMR. You may also need a second dose.  Pneumococcal 13-valent conjugate (PCV13) vaccine. You may need this if you have certain conditions and were not previously vaccinated.  Pneumococcal polysaccharide (PPSV23) vaccine. You may need one or two doses if you smoke cigarettes or if you have certain conditions.  Meningococcal vaccine. One dose is recommended if you are age 68-21 years and a first-year college student living in a residence hall, or if you have one of several medical conditions. You may also need additional booster doses.  Hepatitis A vaccine. You may need this if you have certain conditions or if you travel or work in places where you may be exposed to hepatitis A.  Hepatitis B vaccine. You may need this if you have certain conditions or if you travel or work in places where you may be exposed to hepatitis B.  Haemophilus influenzae type b (Hib) vaccine. You may need this  if you have certain risk factors.  Talk to your health care provider about which screenings and vaccines you need and how often you need them. This information is not intended to replace advice given to you by your health care provider. Make sure you discuss any questions you have with your health care provider. Document Released: 07/11/2001 Document Revised:  02/02/2016 Document Reviewed: 03/16/2015 Elsevier Interactive Patient Education  2018 [employer]. Restless Legs Syndrome Restless legs syndrome is a condition that causes uncomfortable feelings or sensations in the legs, especially while sitting or lying down. The sensations usually cause an overwhelming urge to move the legs. The arms can also sometimes be affected. The condition can range from mild to severe. The symptoms often interfere with a person's ability to sleep. What are the causes? The cause of this condition is not known. What increases the risk? This condition is more likely to develop in:  People who are older than age 61.  Pregnant women. In general, restless legs syndrome is more common in women than in men.  People who have a family history of the condition.  People who have certain medical conditions, such as iron deficiency, kidney disease, Parkinson disease, or nerve damage.  People who take certain medicines, such as medicines for high blood pressure, nausea, colds, allergies, depression, and some heart conditions.  What are the signs or symptoms? The main symptom of this condition is uncomfortable sensations in the legs. These sensations may be:  Described as pulling, tingling, prickling, throbbing, crawling, or burning.  Worse while you are sitting or lying down.  Worse during periods of rest or inactivity.  Worse at night, often interfering with your sleep.  Accompanied by a very strong urge to move your legs.  Temporarily relieved by movement of your legs.  The sensations usually affect both sides of the body. The arms can also be affected, but this is rare. People who have this condition often have tiredness during the day because of their lack of sleep at night. How is this diagnosed? This condition may be diagnosed based on your description of the symptoms. You may also have tests, including blood tests, to check for other conditions that may lead  to your symptoms. In some cases, you may be asked to spend some time in a sleep lab so your sleeping can be monitored. How is this treated? Treatment for this condition is focused on managing the symptoms. Treatment may include:  Self-help and lifestyle changes.  Medicines.  Follow these instructions at home:  Take medicines only as directed by your health care provider.  Try these methods to get temporary relief from the uncomfortable sensations: ? Massage your legs. ? Walk or stretch. ? Take a cold or hot bath.  Practice good sleep habits. For example, go to bed and get up at the same time every day.  Exercise regularly.  Practice ways of relaxing, such as yoga or meditation.  Avoid caffeine and alcohol.  Do not use any tobacco products, including cigarettes, chewing tobacco, or electronic cigarettes. If you need help quitting, ask your health care provider.  Keep all follow-up visits as directed by your health care provider. This is important. Contact a health care provider if: Your symptoms do not improve with treatment, or they get worse. This information is not intended to replace advice given to you by your health care provider. Make sure you discuss any questions you have with your health care provider. Document Released: 05/05/2002 Document Revised: 10/21/2015  Document Reviewed: 05/11/2014 Elsevier Interactive Patient Education  [employer].

## 2017-09-04 NOTE — Progress Notes (Signed)
  Subjective:     Christine Arellano is a engaged white 27 y.o. female and is here for a comprehensive physical exam. [id number], with 3 c/s deliveries. The patient reports no problems.  Social History   Socioeconomic History  . Marital status: Single    Spouse name: Not on file  . Number of children: Not on file  . Years of education: Not on file  . Highest education level: Not on file  Occupational History  . Not on file  Social Needs  . Financial resource strain: Not on file  . Food insecurity:    Worry: Not on file    Inability: Not on file  . Transportation needs:    Medical: Not on file    Non-medical: Not on file  Tobacco Use  . Smoking status: Former Smoker    Packs/day: 1.00    Types: Cigarettes    Last attempt to quit: 08/27/2009    Years since quitting: 8.0  . Smokeless tobacco: Never Used  Substance and Sexual Activity  . Alcohol use: No    Alcohol/week: 0.0 oz  . Drug use: No  . Sexual activity: Yes    Birth control/protection: Pill  Lifestyle  . Physical activity:    Days per week: Not on file    Minutes per session: Not on file  . Stress: Not on file  Relationships  . Social connections:    Talks on phone: Not on file    Gets together: Not on file    Attends religious service: Not on file    Active member of club or organization: Not on file    Attends meetings of clubs or organizations: Not on file    Relationship status: Not on file  . Intimate partner violence:    Fear of current or ex partner: Not on file    Emotionally abused: Not on file    Physically abused: Not on file    Forced sexual activity: Not on file  Other Topics Concern  . Not on file  Social History Narrative  . Not on file   Health Maintenance  Topic Date Due  . INFLUENZA VACCINE  12/27/2017  . PAP SMEAR  07/12/2019  . TETANUS/TDAP  02/23/2027  . HIV Screening  Completed    The following portions of the patient's history were reviewed and updated as appropriate: allergies,  current medications, past family history, past medical history, past social history, past surgical history and problem list.  Review of Systems Pertinent items noted in HPI and remainder of comprehensive ROS otherwise negative.   Objective:    General appearance: alert, cooperative and appears stated age Neck: no adenopathy, no carotid bruit, no JVD, supple, symmetrical, trachea midline and thyroid not enlarged, symmetric, no tenderness/mass/nodules Lungs: clear to auscultation bilaterally Breasts: normal appearance, no masses or tenderness Heart: regular rate and rhythm, S1, S2 normal, no murmur, click, rub or gallop Abdomen: soft, non-tender; bowel sounds normal; no masses,  no organomegaly Pelvic: cervix normal in appearance, external genitalia normal, no adnexal masses or tenderness, no cervical motion tenderness, rectovaginal septum normal, uterus normal size, shape, and consistency and vagina normal without discharge    Assessment:    Healthy female exam. Irregular menses secondary to lactation, postpartum hairloss     Plan:  RTC 1 year or as needed. Melody Shambley,CNM   See After Visit Summary for Counseling Recommendations

## 2017-09-10 ENCOUNTER — Other Ambulatory Visit: Payer: Self-pay | Admitting: Obstetrics and Gynecology

## 2017-09-10 NOTE — Telephone Encounter (Signed)
Pt is currently on mini pill (since 05/2017). She has had a cycle every 2 weeks since 08/14/2017. It lasts between 5-7 days. First 3 days are heavy changing q 3 hours. She is requesting a different pill. Advised d/t breastfeeding pt will need to be on progesterone only.  Suggested iud or depo but pt is not too keen on either. Will send message to mns. Pt aware mns will get this message tomorrow.

## 2017-09-10 NOTE — Telephone Encounter (Signed)
The patient called and stated that she needs a prescription or needs to get birth control due to her continuously bleeding for 3 months. Please advise. (Amy has left office for the day)

## 2017-09-11 MED ORDER — NORETHINDRONE 0.35 MG PO TABS: 1.0000 | ORAL_TABLET | Freq: Every day | ORAL | 11 refills | Status: DC

## 2017-09-11 NOTE — Telephone Encounter (Signed)
Spoke with MNS- she states pt can have Lo loestrin.  [place]

## 2017-09-13 MED ORDER — NORETHIN-ETH ESTRAD-FE BIPHAS 1 MG-10 MCG / 10 MCG PO TABS: 1.0000 | ORAL_TABLET | Freq: Every day | ORAL | 11 refills | Status: DC

## 2017-09-13 NOTE — Telephone Encounter (Signed)
Pt aware lo loestin erx.  Pt aware to use back up x 1 month. May have aub for the first 3 months. Pt to contact if decrease in breast milk, aub after 3 packs, or unable to p/u.

## 2017-09-13 NOTE — Addendum Note (Signed)
Addended by: Elouise Munroe on: 09/13/2017 11:37 AM   Modules accepted: Orders

## 2017-09-27 ENCOUNTER — Telehealth: Payer: Self-pay | Admitting: Obstetrics and Gynecology

## 2017-09-27 NOTE — Telephone Encounter (Signed)
pls advise

## 2017-09-27 NOTE — Telephone Encounter (Signed)
The patient called and stated that she is experiencing Dry, burning, and a itching problem with her eyes. The patient is wanting Melody or Amy to send in a prescription for her relief for her eyes. No other information was disclosed. Please advise.

## 2017-09-28 NOTE — Telephone Encounter (Signed)
Over the counter claritin and allergy eye drops are good.

## 2017-10-18 ENCOUNTER — Ambulatory Visit (INDEPENDENT_AMBULATORY_CARE_PROVIDER_SITE_OTHER): Payer: Medicaid Other | Admitting: Internal Medicine

## 2017-10-18 ENCOUNTER — Encounter: Payer: Self-pay | Admitting: Internal Medicine

## 2017-10-18 VITALS — BP 103/78 | HR 74 | Resp 16 | Ht 62.0 in | Wt 162.0 lb

## 2017-10-18 DIAGNOSIS — H04123 Dry eye syndrome of bilateral lacrimal glands: Secondary | ICD-10-CM | POA: Diagnosis not present

## 2017-10-18 NOTE — Progress Notes (Signed)
Date:  10/18/2017   Name:  Christine Arellano   DOB:  April 18, 1991   MRN:  [id number]   Chief Complaint: Eye Drainage (L and R eye burning and draining dx with dry eyes years ago but stopped and now flaring up. )  Eye Problem   Both eyes are affected.This is a recurrent problem. The problem occurs intermittently. The problem has been waxing and waning. There was no injury mechanism. The pain is mild. Associated symptoms include eye redness. Pertinent negatives include no double vision, foreign body sensation or itching. She has tried eye drops for the symptoms. The treatment provided mild relief.  She was told years ago that she had chronic dry eyes.  She does not wear contacts or glasses.  Vision is normal.   Review of Systems  Constitutional: Negative for chills and fatigue.  HENT: Negative for congestion, hearing loss, postnasal drip and sore throat.   Eyes: Positive for redness. Negative for double vision, itching and visual disturbance.  Respiratory: Negative for cough and chest tightness.   Cardiovascular: Negative for chest pain.    Patient Active Problem List   Diagnosis Date Noted  . Knee pain 07/06/2016  . Mood disorder ([place]) 03/21/2016  . Depression 05/03/2015  . S/P cesarean section 02/24/2015    Prior to Admission medications   Medication Sig Start Date End Date Taking? Authorizing Provider  benzoyl peroxide (BENZOYL PEROXIDE) 5 % external liquid Apply topically 2 (two) times daily. 01/04/17  Yes Shambley, Melody N, CNM  Biotin 5000 MCG CAPS Take 5,000 mcg by mouth daily.    Yes [provider]  Norethindrone-Ethinyl Estradiol-Fe Biphas (LO LOESTRIN FE) 1 MG-10 MCG / 10 MCG tablet Take 1 tablet by mouth daily. 09/13/17  Yes Shambley, Melody N, CNM  Prenatal Vit-Fe Fumarate-FA (MULTIVITAMIN-PRENATAL) 27-0.8 MG TABS tablet Take 1 tablet by mouth daily at 12 noon. 06/12/17  Yes Philip Aspen, CNM    No Known Allergies  Past Surgical History:  Procedure Laterality  Date  . arthroscopic knee Right 2008  . CESAREAN SECTION  2014  . CESAREAN SECTION N/A 02/24/2015   Procedure: CESAREAN SECTION;  Surgeon: Rubie Maid, MD;  Location: [facility] ORS;  Service: Obstetrics;  Laterality: N/A;  . CESAREAN SECTION N/A 05/09/2017   Procedure: REPEAT CESAREAN SECTION;  Surgeon: Rubie Maid, MD;  Location: [facility] ORS;  Service: Obstetrics;  Laterality: N/A;  . HEMORROIDECTOMY    . TONSILLECTOMY    . WISDOM TOOTH EXTRACTION      Social History   Tobacco Use  . Smoking status: Former Smoker    Packs/day: 1.00    Types: Cigarettes    Last attempt to quit: 08/27/2009    Years since quitting: 8.1  . Smokeless tobacco: Never Used  Substance Use Topics  . Alcohol use: No    Alcohol/week: 0.0 oz  . Drug use: No     Medication list has been reviewed and updated.  Current Meds  Medication Sig  . benzoyl peroxide (BENZOYL PEROXIDE) 5 % external liquid Apply topically 2 (two) times daily.  . Biotin 5000 MCG CAPS Take 5,000 mcg by mouth daily.   . Norethindrone-Ethinyl Estradiol-Fe Biphas (LO LOESTRIN FE) 1 MG-10 MCG / 10 MCG tablet Take 1 tablet by mouth daily.  . Prenatal Vit-Fe Fumarate-FA (MULTIVITAMIN-PRENATAL) 27-0.8 MG TABS tablet Take 1 tablet by mouth daily at 12 noon.    PHQ 2/9 Scores 09/16/2015  PHQ - 2 Score 6  PHQ- 9 Score 18    Physical  Exam  Constitutional: She is oriented to person, place, and time. She appears well-developed. No distress.  HENT:  Head: Normocephalic and atraumatic.  Eyes: Pupils are equal, round, and reactive to light. Conjunctivae, EOM and lids are normal.  Pulmonary/Chest: Effort normal. No respiratory distress.  Musculoskeletal: Normal range of motion.  Neurological: She is alert and oriented to person, place, and time.  Skin: Skin is warm and dry. No rash noted.  Psychiatric: She has a normal mood and affect. Her behavior is normal. Thought content normal.  Nursing note and vitals reviewed.   BP 103/78   Pulse 74    Resp 16   Ht 5\' 2"  (1.575 m)   Wt 162 lb (73.5 kg)   LMP 10/09/2017   SpO2 98%   BMI 29.63 kg/m   Assessment and Plan: 1. Dry eye syndrome of both eyes Try otc moisturizing drops before going outside Wear dark sunglasses outside Consider Ophthalmology evaluation if worsening   No orders of the defined types were placed in this encounter.   Partially dictated using Editor, commissioning. Any errors are unintentional.  [name], MD [facility]  10/18/2017

## 2017-10-18 NOTE — Patient Instructions (Signed)
Hypotears, Systane, Refresh - all over the counter

## 2017-12-25 ENCOUNTER — Ambulatory Visit: Payer: Medicaid Other | Admitting: Internal Medicine

## 2017-12-26 ENCOUNTER — Ambulatory Visit (INDEPENDENT_AMBULATORY_CARE_PROVIDER_SITE_OTHER): Payer: Medicaid Other | Admitting: Obstetrics and Gynecology

## 2017-12-26 ENCOUNTER — Encounter: Payer: Self-pay | Admitting: Obstetrics and Gynecology

## 2017-12-26 VITALS — BP 123/78 | HR 89 | Ht 62.0 in | Wt 170.4 lb

## 2017-12-26 DIAGNOSIS — F419 Anxiety disorder, unspecified: Secondary | ICD-10-CM

## 2017-12-26 DIAGNOSIS — N921 Excessive and frequent menstruation with irregular cycle: Secondary | ICD-10-CM

## 2017-12-26 DIAGNOSIS — E669 Obesity, unspecified: Secondary | ICD-10-CM

## 2017-12-26 DIAGNOSIS — Z6831 Body mass index (BMI) 31.0-31.9, adult: Secondary | ICD-10-CM

## 2017-12-26 DIAGNOSIS — F329 Major depressive disorder, single episode, unspecified: Secondary | ICD-10-CM | POA: Diagnosis not present

## 2017-12-26 MED ORDER — DROSPIRENONE-ETHINYL ESTRADIOL 3-0.02 MG PO TABS
1.0000 | ORAL_TABLET | Freq: Every day | ORAL | 11 refills | Status: DC
Start: 2017-12-26 — End: 2018-01-24

## 2017-12-26 MED ORDER — CYANOCOBALAMIN 1000 MCG/ML IJ SOLN: 1000.0000 ug | INTRAMUSCULAR | 1 refills | Status: DC

## 2017-12-26 MED ORDER — BUPROPION HCL ER (XL) 150 MG PO TB24
150.0000 mg | ORAL_TABLET | Freq: Every day | ORAL | 6 refills | Status: DC
Start: 2017-12-26 — End: 2018-01-24

## 2017-12-26 NOTE — Progress Notes (Signed)
  Subjective:     Patient ID: Christine Arellano, female   DOB: 1991/04/28, 27 y.o.   MRN: [id number]  HPI Desires change of BC as is weaning infant from breast feeding. Down to only nursing at bedtime. Desires weight loss.  Depression and anxiety returning.  Depression screen [facility] 2/9 12/26/2017 09/16/2015  Decreased Interest 2 3  Down, Depressed, Hopeless 2 3  PHQ - 2 Score 4 6  Altered sleeping 2 2  Tired, decreased energy 2 1  Change in appetite 2 3  Feeling bad or failure about yourself  2 3  Trouble concentrating 2 1  Moving slowly or fidgety/restless 0 1  Suicidal thoughts 1 1  PHQ-9 Score 15 18  Difficult doing work/chores Somewhat difficult Somewhat difficult   Review of Systems  Constitutional: Positive for appetite change.  HENT: Negative.   Eyes: Negative.   Respiratory: Negative.   Cardiovascular: Negative.   Gastrointestinal: Negative.   Endocrine: Negative.   Genitourinary: Positive for menstrual problem.  Musculoskeletal: Positive for joint swelling.  Skin: Negative.   Allergic/Immunologic: Negative.   Neurological: Negative.   Hematological: Negative.   Psychiatric/Behavioral: Positive for agitation and dysphoric mood. The patient is nervous/anxious.        Objective:   Physical Exam A&Ox4 Well groomed female in no distress Blood pressure 123/78, pulse 89, height 5\' 2"  (1.575 m), weight 170 lb 6.4 oz (77.3 kg), last menstrual period 12/14/2017, currently breastfeeding.  Body mass index is 31.17 kg/m.  PE not indicated    Assessment:     Obesity Depression with anxiety Contraception management.     Plan:     Switched OCPs to Yaz as it worked well for her in past. Restarted wellbutrin and adipex and B12. Instructed to stop all breastfeeding in leu of starting these medications.B12 injection given today.   She was also instructed to wait on the Adipex until 2 weeks on the other medications, and then start if she feels ok.   RTC in 6 weeks for wt/bp/B12.

## 2018-01-01 ENCOUNTER — Other Ambulatory Visit: Payer: Self-pay | Admitting: *Deleted

## 2018-01-01 DIAGNOSIS — D229 Melanocytic nevi, unspecified: Secondary | ICD-10-CM

## 2018-01-14 ENCOUNTER — Other Ambulatory Visit: Payer: Self-pay | Admitting: Obstetrics and Gynecology

## 2018-01-15 ENCOUNTER — Other Ambulatory Visit: Payer: Self-pay | Admitting: *Deleted

## 2018-01-15 MED ORDER — METRONIDAZOLE 500 MG PO TABS: 500.0000 mg | ORAL_TABLET | Freq: Two times a day (BID) | ORAL | 0 refills | Status: DC

## 2018-01-21 ENCOUNTER — Encounter: Payer: Medicaid Other | Admitting: Obstetrics and Gynecology

## 2018-01-22 ENCOUNTER — Encounter: Payer: Medicaid Other | Admitting: Obstetrics and Gynecology

## 2018-01-23 ENCOUNTER — Encounter: Payer: Medicaid Other | Admitting: Obstetrics and Gynecology

## 2018-01-24 ENCOUNTER — Encounter: Payer: Self-pay | Admitting: Obstetrics and Gynecology

## 2018-01-24 ENCOUNTER — Ambulatory Visit: Payer: Medicaid Other | Admitting: Obstetrics and Gynecology

## 2018-01-24 ENCOUNTER — Telehealth: Payer: Self-pay | Admitting: Obstetrics and Gynecology

## 2018-01-24 VITALS — BP 123/78 | HR 103 | Ht 62.0 in | Wt 151.3 lb

## 2018-01-24 DIAGNOSIS — E663 Overweight: Secondary | ICD-10-CM

## 2018-01-24 DIAGNOSIS — F329 Major depressive disorder, single episode, unspecified: Secondary | ICD-10-CM | POA: Diagnosis not present

## 2018-01-24 MED ORDER — DROSPIRENONE-ETHINYL ESTRADIOL 3-0.02 MG PO TABS: 1.0000 | ORAL_TABLET | Freq: Every day | ORAL | 4 refills | Status: DC

## 2018-01-24 MED ORDER — BUPROPION HCL ER (XL) 150 MG PO TB24: 150.0000 mg | ORAL_TABLET | Freq: Every day | ORAL | 2 refills | Status: DC

## 2018-01-24 MED ORDER — ARIPIPRAZOLE 5 MG PO TABS: 5.0000 mg | ORAL_TABLET | Freq: Every day | ORAL | 1 refills | Status: DC

## 2018-01-24 NOTE — Progress Notes (Signed)
SUBJECTIVE:  27 y.o. here for follow-up weight loss visit, previously seen 8 weeks ago. She  feels like weight loss medication is working, but still wished she had lost more weight. Has lost 19#s in 2 months.  States doesn't feel but 50% better with wellbutrin and is still tearful. Does admit is when spouse says mean things to her or when reflecting on his past affair. She is sleeping better and has more energy. Is exercising 3 times a day. 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  OBJECTIVE:  BP 123/78   Pulse (!) 103   Ht 5\' 2"  (1.575 m)   Wt 151 lb 4.8 oz (68.6 kg)   LMP 01/18/2018   BMI 27.67 kg/m   Body mass index is 27.67 kg/m. Patient appears well. ASSESSMENT:  Overweight- responding well to weight loss plan Depression on SNRI  PLAN:  To continue with current medications. B12 1075mcg/ml injection given Will add Abilify 5 mg (1/2 tablet) daily to enhance depression medication. And she will let me know vis=a MyChart how it is working. Mother will give next B12 injection as she is losing medicaid.   Jenean Escandon,CNM  [facility] [place], CNM

## 2018-01-24 NOTE — Telephone Encounter (Signed)
She called and stated that the pharmacy will not refill her meds that far in advance but only a month at a time.  She wanted to let Christine Arellano and Christine Arellano know **NO FURTHER ACTION NEEDED AT THIS TIME**

## 2018-02-07 ENCOUNTER — Telehealth: Payer: Self-pay | Admitting: Obstetrics and Gynecology

## 2018-02-07 ENCOUNTER — Other Ambulatory Visit: Payer: Self-pay | Admitting: *Deleted

## 2018-02-07 ENCOUNTER — Encounter: Payer: Self-pay | Admitting: *Deleted

## 2018-02-07 MED ORDER — POLYETHYLENE GLYCOL 3350 17 G PO PACK: 17.0000 g | PACK | Freq: Every day | ORAL | 0 refills | Status: DC

## 2018-02-07 NOTE — Telephone Encounter (Signed)
Sent rx in and sent pt my chart message

## 2018-02-07 NOTE — Telephone Encounter (Signed)
The patient called and asked for something to help with her persistent constipation that is lasting her for a week at a time before she has a BM.  She is also having issues with hemorrhoids due to the above.  Her call back number is [phone] and asked if her nurse, Amy, could call her with more advice if possible, please advise, thanks.

## 2018-02-19 ENCOUNTER — Telehealth: Payer: Self-pay | Admitting: Obstetrics and Gynecology

## 2018-02-19 NOTE — Telephone Encounter (Signed)
The patient is cancelling her Wed, 02/20/18 appointment and states she was told she could give the info to her nurse, and is asking that Amy call her at [phone], please advise, thanks.

## 2018-02-20 ENCOUNTER — Encounter: Payer: Self-pay | Admitting: Obstetrics and Gynecology

## 2018-02-21 ENCOUNTER — Telehealth: Payer: Self-pay | Admitting: Obstetrics and Gynecology

## 2018-02-21 NOTE — Telephone Encounter (Signed)
The patient called and stated that she has a few questions for a nurse and would like to speak with her directly. The patient is aware that Amy is out of the office and wants to wait to speak with her. Please advise.

## 2018-02-26 NOTE — Telephone Encounter (Signed)
See previous message

## 2018-02-26 NOTE — Telephone Encounter (Signed)
Made pt appt 03/04/18

## 2018-03-05 ENCOUNTER — Encounter: Payer: Self-pay | Admitting: Obstetrics and Gynecology

## 2018-05-03 ENCOUNTER — Encounter: Payer: Self-pay | Admitting: Obstetrics and Gynecology

## 2018-05-07 ENCOUNTER — Encounter: Payer: Self-pay | Admitting: Obstetrics and Gynecology

## 2018-05-07 ENCOUNTER — Ambulatory Visit (INDEPENDENT_AMBULATORY_CARE_PROVIDER_SITE_OTHER): Payer: Medicaid Other | Admitting: Obstetrics and Gynecology

## 2018-05-07 VITALS — BP 124/65 | HR 87 | Ht 62.0 in | Wt 156.7 lb

## 2018-05-07 DIAGNOSIS — N926 Irregular menstruation, unspecified: Secondary | ICD-10-CM | POA: Diagnosis not present

## 2018-05-07 LAB — POCT URINE PREGNANCY: Preg Test, Ur: POSITIVE — AB

## 2018-05-07 NOTE — Patient Instructions (Signed)
First Trimester of Pregnancy The first trimester of pregnancy is from week 1 until the end of week 13 (months 1 through 3). During this time, your baby will begin to develop inside you. At 6-8 weeks, the eyes and face are formed, and the heartbeat can be seen on ultrasound. At the end of 12 weeks, all the baby's organs are formed. Prenatal care is all the medical care you receive before the birth of your baby. Make sure you get good prenatal care and follow all of your doctor's instructions. Follow these instructions at home: Medicines  Take over-the-counter and prescription medicines only as told by your doctor. Some medicines are safe and some medicines are not safe during pregnancy.  Take a prenatal vitamin that contains at least 600 micrograms (mcg) of folic acid.  If you have trouble pooping (constipation), take medicine that will make your stool soft (stool softener) if your doctor approves. Eating and drinking  Eat regular, healthy meals.  Your doctor will tell you the amount of weight gain that is right for you.  Avoid raw meat and uncooked cheese.  If you feel sick to your stomach (nauseous) or throw up (vomit): ? Eat 4 or 5 small meals a day instead of 3 large meals. ? Try eating a few soda crackers. ? Drink liquids between meals instead of during meals.  To prevent constipation: ? Eat foods that are high in fiber, like fresh fruits and vegetables, whole grains, and beans. ? Drink enough fluids to keep your pee (urine) clear or pale yellow. Activity  Exercise only as told by your doctor. Stop exercising if you have cramps or pain in your lower belly (abdomen) or low back.  Do not exercise if it is too hot, too humid, or if you are in a place of great height (high altitude).  Try to avoid standing for long periods of time. Move your legs often if you must stand in one place for a long time.  Avoid heavy lifting.  Wear low-heeled shoes. Sit and stand up straight.  You  can have sex unless your doctor tells you not to. Relieving pain and discomfort  Wear a good support bra if your breasts are sore.  Take warm water baths (sitz baths) to soothe pain or discomfort caused by hemorrhoids. Use hemorrhoid cream if your doctor says it is okay.  Rest with your legs raised if you have leg cramps or low back pain.  If you have puffy, bulging veins (varicose veins) in your legs: ? Wear support hose or compression stockings as told by your doctor. ? Raise (elevate) your feet for 15 minutes, 3-4 times a day. ? Limit salt in your food. Prenatal care  Schedule your prenatal visits by the twelfth week of pregnancy.  Write down your questions. Take them to your prenatal visits.  Keep all your prenatal visits as told by your doctor. This is important. Safety  Wear your seat belt at all times when driving.  Make a list of emergency phone numbers. The list should include numbers for family, friends, the hospital, and police and fire departments. General instructions  Ask your doctor for a referral to a local prenatal class. Begin classes no later than at the start of month 6 of your pregnancy.  Ask for help if you need counseling or if you need help with nutrition. Your doctor can give you advice or tell you where to go for help.  Do not use hot tubs, steam rooms, or   saunas.  Do not douche or use tampons or scented sanitary pads.  Do not cross your legs for long periods of time.  Avoid all herbs and alcohol. Avoid drugs that are not approved by your doctor.  Do not use any tobacco products, including cigarettes, chewing tobacco, and electronic cigarettes. If you need help quitting, ask your doctor. You may get counseling or other support to help you quit.  Avoid cat litter boxes and soil used by cats. These carry germs that can cause birth defects in the baby and can cause a loss of your baby (miscarriage) or stillbirth.  Visit your dentist. At home, brush  your teeth with a soft toothbrush. Be gentle when you floss. Contact a doctor if:  You are dizzy.  You have mild cramps or pressure in your lower belly.  You have a nagging pain in your belly area.  You continue to feel sick to your stomach, you throw up, or you have watery poop (diarrhea).  You have a bad smelling fluid coming from your vagina.  You have pain when you pee (urinate).  You have increased puffiness (swelling) in your face, hands, legs, or ankles. Get help right away if:  You have a fever.  You are leaking fluid from your vagina.  You have spotting or bleeding from your vagina.  You have very bad belly cramping or pain.  You gain or lose weight rapidly.  You throw up blood. It may look like coffee grounds.  You are around people who have German measles, fifth disease, or chickenpox.  You have a very bad headache.  You have shortness of breath.  You have any kind of trauma, such as from a fall or a car accident. Summary  The first trimester of pregnancy is from week 1 until the end of week 13 (months 1 through 3).  To take care of yourself and your unborn baby, you will need to eat healthy meals, take medicines only if your doctor tells you to do so, and do activities that are safe for you and your baby.  Keep all follow-up visits as told by your doctor. This is important as your doctor will have to ensure that your baby is healthy and growing well. This information is not intended to replace advice given to you by your health care provider. Make sure you discuss any questions you have with your health care provider. Document Released: 11/01/2007 Document Revised: 05/23/2016 Document Reviewed: 05/23/2016 Elsevier Interactive Patient Education  2017 [employer]  

## 2018-05-07 NOTE — Progress Notes (Signed)
  Subjective:     Patient ID: Christine Arellano, female   DOB: 1990/12/16, 27 y.o.   MRN: [id number]  HPI Here for pregnancy confirmation with normal LMP 03/15/18, giving EDC 12/20/18 and EGA [redacted]w[redacted]d. [id number] with 3 c/s deliveries. Reports home UPT+ on Nov 18th.   Review of Systems  All other systems reviewed and are negative.      Objective:   Physical Exam A&Ox4 Well groomed female in no distress Blood pressure 124/65, pulse 87, height 5\' 2"  (1.575 m), weight 156 lb 11.2 oz (71.1 kg), last menstrual period 03/15/2018, currently breastfeeding. UPT +    Assessment:     Missed menses    Plan:    Congratulated on pregnancy.prenatal vitamin samples given Viability scan and nurse in take next week with new OB PE in 4 weeks.

## 2018-05-15 ENCOUNTER — Ambulatory Visit (INDEPENDENT_AMBULATORY_CARE_PROVIDER_SITE_OTHER): Payer: Medicaid Other

## 2018-05-15 ENCOUNTER — Ambulatory Visit (INDEPENDENT_AMBULATORY_CARE_PROVIDER_SITE_OTHER): Payer: Medicaid Other | Admitting: Obstetrics and Gynecology

## 2018-05-15 VITALS — BP 94/59 | HR 79 | Ht 62.0 in | Wt 158.0 lb

## 2018-05-15 DIAGNOSIS — Z3491 Encounter for supervision of normal pregnancy, unspecified, first trimester: Secondary | ICD-10-CM | POA: Diagnosis not present

## 2018-05-15 DIAGNOSIS — Z202 Contact with and (suspected) exposure to infections with a predominantly sexual mode of transmission: Secondary | ICD-10-CM

## 2018-05-15 DIAGNOSIS — Z3A09 9 weeks gestation of pregnancy: Secondary | ICD-10-CM | POA: Diagnosis not present

## 2018-05-15 DIAGNOSIS — O3680X0 Pregnancy with inconclusive fetal viability, not applicable or unspecified: Secondary | ICD-10-CM | POA: Diagnosis not present

## 2018-05-15 DIAGNOSIS — N926 Irregular menstruation, unspecified: Secondary | ICD-10-CM

## 2018-05-15 DIAGNOSIS — Z0283 Encounter for blood-alcohol and blood-drug test: Secondary | ICD-10-CM

## 2018-05-15 NOTE — Progress Notes (Signed)
Christine Arellano presents for NOB nurse interview visit.  Pregnancy confirmation done at [facility] on 05/07/2018 .  G-5 P-3.  LMP 03/15/2018.  EDD 12/16/2018. Dating scan done 05/15/2018 measuring 9 weeks 2 days.  Pregnancy education material explained and given.  No cats in the home.  NOB labs ordered.  TSH/HgA1C not ordered BMI.Body mass index is 28.9 kg/m.  Sickle cell lab ordered. HIV and drug screen were explained and ordered.  PNV encouraged.  Genetic screening options discussed.  Genetic testing: Patient request.  Patient to return to clinic 05/31/2018 for NOB physical.

## 2018-05-15 NOTE — Patient Instructions (Signed)
How a Baby Grows During Pregnancy  Pregnancy begins when a female's sperm enters a female's egg (fertilization). Fertilization usually happens in one of the tubes (fallopian tubes) that connect the ovaries to the womb (uterus). The fertilized egg moves down the fallopian tube to the uterus. Once it reaches the uterus, it implants into the lining of the uterus and begins to grow. For the first 10 weeks, the fertilized egg is called an embryo. After 10 weeks, it is called a fetus. As the fetus continues to grow, it receives oxygen and nutrients through tissue (placenta) that grows to support the developing baby. The placenta is the life support system for the baby. It provides oxygen and nutrition and removes waste. Learning as much as you can about your pregnancy and how your baby is developing can help you enjoy the experience. It can also make you aware of when there might be a problem and when to ask questions. How long does a typical pregnancy last? A pregnancy usually lasts 280 days, or about 40 weeks. Pregnancy is divided into three periods of growth, also called trimesters:  First trimester: 0-12 weeks.  Second trimester: 13-27 weeks.  Third trimester: 28-40 weeks. The day when your baby is ready to be born (full term) is your estimated date of delivery. How does my baby develop month by month? First month  The fertilized egg attaches to the inside of the uterus.  Some cells will form the placenta. Others will form the fetus.  The arms, legs, brain, spinal cord, lungs, and heart begin to develop.  At the end of the first month, the heart begins to beat. Second month  The bones, inner ear, eyelids, hands, and feet form.  The genitals develop.  By the end of 8 weeks, all major organs are developing. Third month  All of the internal organs are forming.  Teeth develop below the gums.  Bones and muscles begin to grow. The spine can flex.  The skin is  transparent.  Fingernails and toenails begin to form.  Arms and legs continue to grow longer, and hands and feet develop.  The fetus is about 3 inches (7.6 cm) long. Fourth month  The placenta is completely formed.  The external sex organs, neck, outer ear, eyebrows, eyelids, and fingernails are formed.  The fetus can hear, swallow, and move its arms and legs.  The kidneys begin to produce urine.  The skin is covered with a white, waxy coating (vernix) and very fine hair (lanugo). Fifth month  The fetus moves around more and can be felt for the first time (quickening).  The fetus starts to sleep and wake up and may begin to suck its finger.  The nails grow to the end of the fingers.  The organ in the digestive system that makes bile (gallbladder) functions and helps to digest nutrients.  If your baby is a girl, eggs are present in her ovaries. If your baby is a boy, testicles start to move down into his scrotum. Sixth month  The lungs are formed.  The eyes open. The brain continues to develop.  Your baby has fingerprints and toe prints. Your baby's hair grows thicker.  At the end of the second trimester, the fetus is about 9 inches (22.9 cm) long. Seventh month  The fetus kicks and stretches.  The eyes are developed enough to sense changes in light.  The hands can make a grasping motion.  The fetus responds to sound. Eighth month  All organs and body systems are fully developed and functioning.  Bones harden, and taste buds develop. The fetus may hiccup.  Certain areas of the brain are still developing. The skull remains soft. Ninth month  The fetus gains about  lb (0.23 kg) each week.  The lungs are fully developed.  Patterns of sleep develop.  The fetus's head typically moves into a head-down position (vertex) in the uterus to prepare for birth.  The fetus weighs 6-9 lb (2.72-4.08 kg) and is 19-20 inches (48.26-50.8 cm) long. What can I do to have a  healthy pregnancy and help my baby develop? General instructions  Take prenatal vitamins as directed by your health care provider. These include vitamins such as folic acid, iron, calcium, and vitamin D. They are important for healthy development.  Take medicines only as directed by your health care provider. Read labels and ask a pharmacist or your health care provider whether over-the-counter medicines, supplements, and prescription drugs are safe to take during pregnancy.  Keep all follow-up visits as directed by your health care provider. This is important. Follow-up visits include prenatal care and screening tests. How do I know if my baby is developing well? At each prenatal visit, your health care provider will do several different tests to check on your health and keep track of your baby's development. These include:  Fundal height and position. ? Your health care provider will measure your growing belly from your pubic bone to the top of the uterus using a tape measure. ? Your health care provider will also feel your belly to determine your baby's position.  Heartbeat. ? An ultrasound in the first trimester can confirm pregnancy and show a heartbeat, depending on how far along you are. ? Your health care provider will check your baby's heart rate at every prenatal visit.  Second trimester ultrasound. ? This ultrasound checks your baby's development. It also may show your baby's gender. What should I do if I have concerns about my baby's development? Always talk with your health care provider about any concerns that you may have about your pregnancy and your baby. Summary  A pregnancy usually lasts 280 days, or about 40 weeks. Pregnancy is divided into three periods of growth, also called trimesters.  Your health care provider will monitor your baby's growth and development throughout your pregnancy.  Follow your health care provider's recommendations about taking prenatal  vitamins and medicines during your pregnancy.  Talk with your health care provider if you have any concerns about your pregnancy or your developing baby. This information is not intended to replace advice given to you by your health care provider. Make sure you discuss any questions you have with your health care provider. Document Released: 11/01/2007 Document Revised: 03/28/2017 Document Reviewed: 03/28/2017 Elsevier Interactive Patient Education  2019 [place] [employer] Pregnancy The first trimester of pregnancy is from week 1 until the end of week 13 (months 1 through 3). A week after a sperm fertilizes an egg, the egg will implant on the wall of the uterus. This embryo will begin to develop into a baby. Genes from you and your partner will form the baby. The female genes will determine whether the baby will be a boy or a girl. At 6-8 weeks, the eyes and face will be formed, and the heartbeat can be seen on ultrasound. At the end of 12 weeks, all the baby's organs will be formed. Now that you are pregnant, you will want to  do everything you can to have a healthy baby. Two of the most important things are to get good prenatal care and to follow your health care provider's instructions. Prenatal care is all the medical care you receive before the baby's birth. This care will help prevent, find, and treat any problems during the pregnancy and childbirth. Body changes during your first trimester Your body goes through many changes during pregnancy. The changes vary from woman to woman.  You may gain or lose a couple of pounds at first.  You may feel sick to your stomach (nauseous) and you may throw up (vomit). If the vomiting is uncontrollable, call your health care provider.  You may tire easily.  You may develop headaches that can be relieved by medicines. All medicines should be approved by your health care provider.  You may urinate more often. Painful urination may mean you  have a bladder infection.  You may develop heartburn as a result of your pregnancy.  You may develop constipation because certain hormones are causing the muscles that push stool through your intestines to slow down.  You may develop hemorrhoids or swollen veins (varicose veins).  Your breasts may begin to grow larger and become tender. Your nipples may stick out more, and the tissue that surrounds them (areola) may become darker.  Your gums may bleed and may be sensitive to brushing and flossing.  Dark spots or blotches (chloasma, mask of pregnancy) may develop on your face. This will likely fade after the baby is born.  Your menstrual periods will stop.  You may have a loss of appetite.  You may develop cravings for certain kinds of food.  You may have changes in your emotions from day to day, such as being excited to be pregnant or being concerned that something may go wrong with the pregnancy and baby.  You may have more vivid and strange dreams.  You may have changes in your hair. These can include thickening of your hair, rapid growth, and changes in texture. Some women also have hair loss during or after pregnancy, or hair that feels dry or thin. Your hair will most likely return to normal after your baby is born. What to expect at prenatal visits During a routine prenatal visit:  You will be weighed to make sure you and the baby are growing normally.  Your blood pressure will be taken.  Your abdomen will be measured to track your baby's growth.  The fetal heartbeat will be listened to between weeks 10 and 14 of your pregnancy.  Test results from any previous visits will be discussed. Your health care provider may ask you:  How you are feeling.  If you are feeling the baby move.  If you have had any abnormal symptoms, such as leaking fluid, bleeding, severe headaches, or abdominal cramping.  If you are using any tobacco products, including cigarettes, chewing  tobacco, and electronic cigarettes.  If you have any questions. Other tests that may be performed during your first trimester include:  Blood tests to find your blood type and to check for the presence of any previous infections. The tests will also be used to check for low iron levels (anemia) and protein on red blood cells (Rh antibodies). Depending on your risk factors, or if you previously had diabetes during pregnancy, you may have tests to check for high blood sugar that affects pregnant women (gestational diabetes).  Urine tests to check for infections, diabetes, or protein in the urine.  An ultrasound to confirm the proper growth and development of the baby.  Fetal screens for spinal cord problems (spina bifida) and Down syndrome.  HIV (human immunodeficiency virus) testing. Routine prenatal testing includes screening for HIV, unless you choose not to have this test.  You may need other tests to make sure you and the baby are doing well. Follow these instructions at home: Medicines  Follow your health care provider's instructions regarding medicine use. Specific medicines may be either safe or unsafe to take during pregnancy.  Take a prenatal vitamin that contains at least 600 micrograms (mcg) of folic acid.  If you develop constipation, try taking a stool softener if your health care provider approves. Eating and drinking   Eat a balanced diet that includes fresh fruits and vegetables, whole grains, good sources of protein such as meat, eggs, or tofu, and low-fat dairy. Your health care provider will help you determine the amount of weight gain that is right for you.  Avoid raw meat and uncooked cheese. These carry germs that can cause birth defects in the baby.  Eating four or five small meals rather than three large meals a day may help relieve nausea and vomiting. If you start to feel nauseous, eating a few soda crackers can be helpful. Drinking liquids between meals,  instead of during meals, also seems to help ease nausea and vomiting.  Limit foods that are high in fat and processed sugars, such as fried and sweet foods.  To prevent constipation: ? Eat foods that are high in fiber, such as fresh fruits and vegetables, whole grains, and beans. ? Drink enough fluid to keep your urine clear or pale yellow. Activity  Exercise only as directed by your health care provider. Most women can continue their usual exercise routine during pregnancy. Try to exercise for 30 minutes at least 5 days a week. Exercising will help you: ? Control your weight. ? Stay in shape. ? Be prepared for labor and delivery.  Experiencing pain or cramping in the lower abdomen or lower back is a good sign that you should stop exercising. Check with your health care provider before continuing with normal exercises.  Try to avoid standing for long periods of time. Move your legs often if you must stand in one place for a long time.  Avoid heavy lifting.  Wear low-heeled shoes and practice good posture.  You may continue to have sex unless your health care provider tells you not to. Relieving pain and discomfort  Wear a good support bra to relieve breast tenderness.  Take warm sitz baths to soothe any pain or discomfort caused by hemorrhoids. Use hemorrhoid cream if your health care provider approves.  Rest with your legs elevated if you have leg cramps or low back pain.  If you develop varicose veins in your legs, wear support hose. Elevate your feet for 15 minutes, 3-4 times a day. Limit salt in your diet. Prenatal care  Schedule your prenatal visits by the twelfth week of pregnancy. They are usually scheduled monthly at first, then more often in the last 2 months before delivery.  Write down your questions. Take them to your prenatal visits.  Keep all your prenatal visits as told by your health care provider. This is important. Safety  Wear your seat belt at all times  when driving.  Make a list of emergency phone numbers, including numbers for family, friends, the hospital, and police and fire departments. General instructions  Ask your health care  provider for a referral to a local prenatal education class. Begin classes no later than the beginning of month 6 of your pregnancy.  Ask for help if you have counseling or nutritional needs during pregnancy. Your health care provider can offer advice or refer you to specialists for help with various needs.  Do not use hot tubs, steam rooms, or saunas.  Do not douche or use tampons or scented sanitary pads.  Do not cross your legs for long periods of time.  Avoid cat litter boxes and soil used by cats. These carry germs that can cause birth defects in the baby and possibly loss of the fetus by miscarriage or stillbirth.  Avoid all smoking, herbs, alcohol, and medicines not prescribed by your health care provider. Chemicals in these products affect the formation and growth of the baby.  Do not use any products that contain nicotine or tobacco, such as cigarettes and e-cigarettes. If you need help quitting, ask your health care provider. You may receive counseling support and other resources to help you quit.  Schedule a dentist appointment. At home, brush your teeth with a soft toothbrush and be gentle when you floss. Contact a health care provider if:  You have dizziness.  You have mild pelvic cramps, pelvic pressure, or nagging pain in the abdominal area.  You have persistent nausea, vomiting, or diarrhea.  You have a bad smelling vaginal discharge.  You have pain when you urinate.  You notice increased swelling in your face, hands, legs, or ankles.  You are exposed to fifth disease or chickenpox.  You are exposed to [place] measles (rubella) and have never had it. Get help right away if:  You have a fever.  You are leaking fluid from your vagina.  You have spotting or bleeding from your  vagina.  You have severe abdominal cramping or pain.  You have rapid weight gain or loss.  You vomit blood or material that looks like coffee grounds.  You develop a severe headache.  You have shortness of breath.  You have any kind of trauma, such as from a fall or a car accident. Summary  The first trimester of pregnancy is from week 1 until the end of week 13 (months 1 through 3).  Your body goes through many changes during pregnancy. The changes vary from woman to woman.  You will have routine prenatal visits. During those visits, your health care provider will examine you, discuss any test results you may have, and talk with you about how you are feeling. This information is not intended to replace advice given to you by your health care provider. Make sure you discuss any questions you have with your health care provider. Document Released: 05/09/2001 Document Revised: 04/26/2016 Document Reviewed: 04/26/2016 Elsevier Interactive Patient Education  2019 [employer].   Commonly Asked Questions During Pregnancy  Cats: A parasite can be excreted in cat feces.  To avoid exposure you need to have another person empty the little box.  If you must empty the litter box you will need to wear gloves.  Wash your hands after handling your cat.  This parasite can also be found in raw or undercooked meat so this should also be avoided.  Colds, Sore Throats, Flu: Please check your medication sheet to see what you can take for symptoms.  If your symptoms are unrelieved by these medications please call the office.  Dental Work: Most any dental work [employer] recommends is permitted.  X-rays should only  be taken during the first trimester if absolutely necessary.  Your abdomen should be shielded with a lead apron during all x-rays.  Please notify your provider prior to receiving any x-rays.  Novocaine is fine; gas is not recommended.  If your dentist requires a note from [place] prior to dental  work please call the office and we will provide one for you.  Exercise: Exercise is an important part of staying healthy during your pregnancy.  You may continue most exercises you were accustomed to prior to pregnancy.  Later in your pregnancy you will most likely notice you have difficulty with activities requiring balance like riding a bicycle.  It is important that you listen to your body and avoid activities that put you at a higher risk of falling.  Adequate rest and staying well hydrated are a must!  If you have questions about the safety of specific activities ask your provider.    Exposure to Children with illness: Try to avoid obvious exposure; report any symptoms to [place] when noted,  If you have chicken pos, red measles or mumps, you should be immune to these diseases.   Please do not take any vaccines while pregnant unless you have checked with your OB provider.  Fetal Movement: After 28 weeks we recommend you do "kick counts" twice daily.  Lie or sit down in a calm quiet environment and count your baby movements "kicks".  You should feel your baby at least 10 times per hour.  If you have not felt 10 kicks within the first hour get up, walk around and have something sweet to eat or drink then repeat for an additional hour.  If count remains less than 10 per hour notify your provider.  Fumigating: Follow your pest control agent's advice as to how long to stay out of your home.  Ventilate the area well before re-entering.  Hemorrhoids:   Most over-the-counter preparations can be used during pregnancy.  Check your medication to see what is safe to use.  It is important to use a stool softener or fiber in your diet and to drink lots of liquids.  If hemorrhoids seem to be getting worse please call the office.   Hot Tubs:  Hot tubs Jacuzzis and saunas are not recommended while pregnant.  These increase your internal body temperature and should be avoided.  Intercourse:  Sexual intercourse is safe  during pregnancy as long as you are comfortable, unless otherwise advised by your provider.  Spotting may occur after intercourse; report any bright red bleeding that is heavier than spotting.  Labor:  If you know that you are in labor, please go to the hospital.  If you are unsure, please call the office and let us help you decide what to do.  Lifting, straining, etc:  If your job requires heavy lifting or straining please check with your provider for any limitations.  Generally, you should not lift items heavier than that you can lift simply with your hands and arms (no back muscles)  Painting:  Paint fumes do not harm your pregnancy, but may make you ill and should be avoided if possible.  Latex or water based paints have less odor than oils.  Use adequate ventilation while painting.  Permanents & Hair Color:  Chemicals in hair dyes are not recommended as they cause increase hair dryness which can increase hair loss during pregnancy.  " Highlighting" and permanents are allowed.  Dye may be absorbed differently and permanents may not hold  as well during pregnancy.  Sunbathing:  Use a sunscreen, as skin burns easily during pregnancy.  Drink plenty of fluids; avoid over heating.  Tanning Beds:  Because their possible side effects are still unknown, tanning beds are not recommended.  Ultrasound Scans:  Routine ultrasounds are performed at approximately 20 weeks.  You will be able to see your baby's general anatomy an if you would like to know the gender this can usually be determined as well.  If it is questionable when you conceived you may also receive an ultrasound early in your pregnancy for dating purposes.  Otherwise ultrasound exams are not routinely performed unless there is a medical necessity.  Although you can request a scan we ask that you pay for it when conducted because insurance does not cover " patient request" scans.  Work: If your pregnancy proceeds without complications you may  work until your due date, unless your physician or employer advises otherwise.  Round Ligament Pain/Pelvic Discomfort:  Sharp, shooting pains not associated with bleeding are fairly common, usually occurring in the second trimester of pregnancy.  They tend to be worse when standing up or when you remain standing for long periods of time.  These are the result of pressure of certain pelvic ligaments called "round ligaments".  Rest, Tylenol and heat seem to be the most effective relief.  As the womb and fetus grow, they rise out of the pelvis and the discomfort improves.  Please notify the office if your pain seems different than that described.  It may represent a more serious condition.

## 2018-05-16 LAB — URINALYSIS, ROUTINE W REFLEX MICROSCOPIC
Bilirubin, UA: NEGATIVE
Glucose, UA: NEGATIVE
NITRITE UA: NEGATIVE
Protein, UA: NEGATIVE
RBC, UA: NEGATIVE
Specific Gravity, UA: 1.01 (ref 1.005–1.030)
Urobilinogen, Ur: 0.2 mg/dL (ref 0.2–1.0)
pH, UA: 7 (ref 5.0–7.5)

## 2018-05-16 LAB — MONITOR DRUG PROFILE 14(MW)
AMPHETAMINE SCREEN URINE: NEGATIVE ng/mL
BARBITURATE SCREEN URINE: NEGATIVE ng/mL
BENZODIAZEPINE SCREEN, URINE: NEGATIVE ng/mL
Buprenorphine, Urine: NEGATIVE ng/mL
CANNABINOIDS UR QL SCN: NEGATIVE ng/mL
Cocaine (Metab) Scrn, Ur: NEGATIVE ng/mL
Creatinine(Crt), U: 32.5 mg/dL (ref 20.0–300.0)
Fentanyl, Urine: NEGATIVE pg/mL
Meperidine Screen, Urine: NEGATIVE ng/mL
Methadone Screen, Urine: NEGATIVE ng/mL
OXYCODONE+OXYMORPHONE UR QL SCN: NEGATIVE ng/mL
Opiate Scrn, Ur: NEGATIVE ng/mL
PH UR, DRUG SCRN: 6.7 (ref 4.5–8.9)
Phencyclidine Qn, Ur: NEGATIVE ng/mL
Propoxyphene Scrn, Ur: NEGATIVE ng/mL
SPECIFIC GRAVITY: 1.01
Tramadol Screen, Urine: NEGATIVE ng/mL

## 2018-05-16 LAB — MICROSCOPIC EXAMINATION
Bacteria, UA: NONE SEEN
CASTS: NONE SEEN /LPF
Epithelial Cells (non renal): >10 /hpf — AB (ref 0–10)

## 2018-05-16 LAB — ABO AND RH: RH TYPE: POSITIVE

## 2018-05-16 LAB — SICKLE CELL SCREEN: Sickle Cell Screen: NEGATIVE

## 2018-05-16 LAB — RUBELLA SCREEN: Rubella Antibodies, IGG: 1.91 index (ref >0.99)

## 2018-05-16 LAB — HIV ANTIBODY (ROUTINE TESTING W REFLEX): HIV Screen 4th Generation wRfx: NONREACTIVE

## 2018-05-16 LAB — NICOTINE SCREEN, URINE: Cotinine Ql Scrn, Ur: NEGATIVE ng/mL

## 2018-05-16 LAB — VARICELLA ZOSTER ANTIBODY, IGG: Varicella zoster IgG: 359 index (ref >165)

## 2018-05-16 LAB — RPR: RPR Ser Ql: NONREACTIVE

## 2018-05-16 LAB — ANTIBODY SCREEN: ANTIBODY SCREEN: NEGATIVE

## 2018-05-16 LAB — HEPATITIS B SURFACE ANTIGEN: HEP B S AG: NEGATIVE

## 2018-05-17 LAB — GC/CHLAMYDIA PROBE AMP
CHLAMYDIA, DNA PROBE: NEGATIVE
NEISSERIA GONORRHOEAE BY PCR: NEGATIVE

## 2018-05-22 ENCOUNTER — Other Ambulatory Visit: Payer: Self-pay | Admitting: Obstetrics and Gynecology

## 2018-05-22 DIAGNOSIS — R8271 Bacteriuria: Secondary | ICD-10-CM | POA: Insufficient documentation

## 2018-05-22 LAB — URINE CULTURE, OB REFLEX

## 2018-05-22 LAB — CULTURE, OB URINE

## 2018-05-22 MED ORDER — FLUCONAZOLE 100 MG PO TABS: 100.0000 mg | ORAL_TABLET | Freq: Every day | ORAL | 0 refills | Status: AC

## 2018-05-23 ENCOUNTER — Other Ambulatory Visit: Payer: Self-pay | Admitting: Certified Nurse Midwife

## 2018-05-31 ENCOUNTER — Encounter: Payer: Self-pay | Admitting: Obstetrics and Gynecology

## 2018-05-31 ENCOUNTER — Ambulatory Visit (INDEPENDENT_AMBULATORY_CARE_PROVIDER_SITE_OTHER): Payer: Medicaid Other | Admitting: Obstetrics and Gynecology

## 2018-05-31 VITALS — BP 121/64 | HR 89 | Wt 164.0 lb

## 2018-05-31 DIAGNOSIS — Z3491 Encounter for supervision of normal pregnancy, unspecified, first trimester: Secondary | ICD-10-CM

## 2018-05-31 LAB — POCT URINALYSIS DIPSTICK OB
BILIRUBIN UA: NEGATIVE
Blood, UA: NEGATIVE
Glucose, UA: NEGATIVE
Ketones, UA: NEGATIVE
Leukocytes, UA: NEGATIVE
Nitrite, UA: NEGATIVE
POC,PROTEIN,UA: NEGATIVE
Spec Grav, UA: 1.015 (ref 1.010–1.025)
Urobilinogen, UA: 0.2 E.U./dL
pH, UA: 6 (ref 5.0–8.0)

## 2018-05-31 NOTE — Patient Instructions (Signed)

## 2018-05-31 NOTE — Progress Notes (Signed)
NOB PE- pt is doing well, genetic testing done-[facility]

## 2018-05-31 NOTE — Progress Notes (Signed)
NEW OB HISTORY AND PHYSICAL  SUBJECTIVE:       Christine Arellano is a 28 y.o. [phone] female, Patient's last menstrual period was 03/15/2018., Estimated Date of Delivery: 12/16/18, [redacted]w[redacted]d, presents today for establishment of Prenatal Care. She has no unusual complaints and complains of mild headaches. Depressed about weight gain.       Gynecologic History Patient's last menstrual period was 03/15/2018. Normal Contraception: none Last Pap: 06/2016. Results were: normal  Obstetric History OB History  Gravida Para Term Preterm AB Living  5 3 3   1 3   SAB TAB Ectopic Multiple Live Births  1     0 3    # Outcome Date GA Lbr Len/2nd Weight Sex Delivery Anes PTL Lv  5 Current           4 Term 05/09/17 [redacted]w[redacted]d  6 lb 4.5 oz (2.85 kg) F CS-LTranv Spinal  LIV  3 Term 02/24/15 [redacted]w[redacted]d  6 lb 10.5 oz (3.019 kg) M CS-LTranv Spinal  LIV  2 SAB 02/2014          1 Term 04/21/13    M CS-Unspec   LIV     Complications: Fetal Intolerance    Past Medical History:  Diagnosis Date  . Anxiety   . Depression   . GERD (gastroesophageal reflux disease)   . Headache   . History of IBS   . Insomnia   . Knee pain 07/06/2016  . Mood disorder ([place]) 03/21/2016    Past Surgical History:  Procedure Laterality Date  . arthroscopic knee Right 2008  . CESAREAN SECTION  2014  . CESAREAN SECTION N/A 02/24/2015   Procedure: CESAREAN SECTION;  Surgeon: Rubie Maid, MD;  Location: [facility] ORS;  Service: Obstetrics;  Laterality: N/A;  . CESAREAN SECTION N/A 05/09/2017   Procedure: REPEAT CESAREAN SECTION;  Surgeon: Rubie Maid, MD;  Location: [facility] ORS;  Service: Obstetrics;  Laterality: N/A;  . HEMORROIDECTOMY    . TONSILLECTOMY    . WISDOM TOOTH EXTRACTION      Current Outpatient Medications on File Prior to Visit  Medication Sig Dispense Refill  . Biotin 5000 MCG CAPS Take 5,000 mcg by mouth daily.     . Prenatal Vit-Fe Fumarate-FA (MULTIVITAMIN-PRENATAL) 27-0.8 MG TABS tablet Take 1 tablet by mouth daily at 12  noon.     No current facility-administered medications on file prior to visit.     No Known Allergies  Social History   Socioeconomic History  . Marital status: Soil scientist    Spouse name: Not on file  . Number of children: Not on file  . Years of education: Not on file  . Highest education level: Not on file  Occupational History  . Not on file  Social Needs  . Financial resource strain: Not hard at all  . Food insecurity:    Worry: Not on file    Inability: Not on file  . Transportation needs:    Medical: No    Non-medical: No  Tobacco Use  . Smoking status: Former Smoker    Packs/day: 1.00    Types: Cigarettes    Last attempt to quit: 08/27/2009    Years since quitting: 8.7  . Smokeless tobacco: Never Used  Substance and Sexual Activity  . Alcohol use: No    Alcohol/week: 0.0 standard drinks  . Drug use: No  . Sexual activity: Yes  Lifestyle  . Physical activity:    Days per week: Not on file    Minutes  per session: Not on file  . Stress: Rather much  Relationships  . Social connections:    Talks on phone: Not on file    Gets together: Not on file    Attends religious service: Not on file    Active member of club or organization: Not on file    Attends meetings of clubs or organizations: Not on file    Relationship status: Not on file  . Intimate partner violence:    Fear of current or ex partner: No    Emotionally abused: No    Physically abused: No    Forced sexual activity: No  Other Topics Concern  . Not on file  Social History Narrative  . Not on file    Family History  Problem Relation Age of Onset  . Depression Father   . Hypertension Father   . Cancer Paternal Aunt        breast  . Breast cancer Paternal Aunt   . Heart disease Paternal Uncle   . Ovarian cancer Neg Hx   . Colon cancer Neg Hx     The following portions of the patient's history were reviewed and updated as appropriate: allergies, current medications, past OB history,  past medical history, past surgical history, past family history, past social history, and problem list.    OBJECTIVE: Initial Physical Exam (New OB)  GENERAL APPEARANCE: alert, well appearing, in no apparent distress, oriented to person, place and time HEAD: normocephalic, atraumatic MOUTH: mucous membranes moist, pharynx normal without lesions and dental hygiene good THYROID: no thyromegaly or masses present BREASTS: not examined LUNGS: clear to auscultation, no wheezes, rales or rhonchi, symmetric air entry HEART: regular rate and rhythm, no murmurs ABDOMEN: soft, nontender, nondistended, no abnormal masses, no epigastric pain, fundus not palpable and FHT present EXTREMITIES: no redness or tenderness in the calves or thighs SKIN: normal coloration and turgor, no rashes LYMPH NODES: no adenopathy palpable NEUROLOGIC: alert, oriented, normal speech, no focal findings or movement disorder noted  PELVIC EXAM not indicated.  ASSESSMENT: Normal pregnancy Previous c/s x3  PLAN: Prenatal care Genetic screening lab obtained today See orders

## 2018-06-01 LAB — COMPREHENSIVE METABOLIC PANEL
ALBUMIN: 4 g/dL (ref 3.5–5.5)
ALT: 8 IU/L (ref 0–32)
AST: 11 IU/L (ref 0–40)
Albumin/Globulin Ratio: 1.9 (ref 1.2–2.2)
Alkaline Phosphatase: 40 IU/L (ref 39–117)
BUN/Creatinine Ratio: 17 (ref 9–23)
BUN: 8 mg/dL (ref 6–20)
Bilirubin Total: <0.2 mg/dL (ref 0.0–1.2)
CO2: 19 mmol/L — ABNORMAL LOW (ref 20–29)
Calcium: 9.2 mg/dL (ref 8.7–10.2)
Chloride: 103 mmol/L (ref 96–106)
Creatinine, Ser: 0.47 mg/dL — ABNORMAL LOW (ref 0.57–1.00)
GFR calc Af Amer: 154 mL/min/{1.73_m2} (ref >59)
GFR calc non Af Amer: 134 mL/min/{1.73_m2} (ref >59)
Globulin, Total: 2.1 g/dL (ref 1.5–4.5)
Glucose: 69 mg/dL (ref 65–99)
Potassium: 4.2 mmol/L (ref 3.5–5.2)
Sodium: 139 mmol/L (ref 134–144)
Total Protein: 6.1 g/dL (ref 6.0–8.5)

## 2018-06-03 ENCOUNTER — Telehealth: Payer: Self-pay | Admitting: Obstetrics and Gynecology

## 2018-06-03 ENCOUNTER — Other Ambulatory Visit: Payer: Self-pay | Admitting: *Deleted

## 2018-06-03 MED ORDER — PRENATAL 27-0.8 MG PO TABS: 1.0000 | ORAL_TABLET | Freq: Every day | ORAL | 6 refills | Status: DC

## 2018-06-03 NOTE — Telephone Encounter (Signed)
Done-ac 

## 2018-06-03 NOTE — Telephone Encounter (Signed)
The patient is asking for a refill of her prenatal vitamins, please advise, thanks.

## 2018-06-04 ENCOUNTER — Encounter: Payer: Self-pay | Admitting: *Deleted

## 2018-06-04 NOTE — Telephone Encounter (Signed)
Christine Arellano called again today because she states she has not heard back from the nurse yet, and she has been a few weeks without prenatals.  She is asking for a call back, please advise, thanks.

## 2018-06-04 NOTE — Telephone Encounter (Signed)
Prenatal have been sent to pharmacy

## 2018-06-10 ENCOUNTER — Telehealth: Payer: Self-pay | Admitting: Obstetrics and Gynecology

## 2018-06-10 NOTE — Telephone Encounter (Signed)
The patient called to check the status of her results and wanted to inform a nurse that she would prefer a call back when her results are in and signed off on. Please advise.

## 2018-06-11 ENCOUNTER — Other Ambulatory Visit: Payer: Self-pay | Admitting: Obstetrics and Gynecology

## 2018-06-11 DIAGNOSIS — O289 Unspecified abnormal findings on antenatal screening of mother: Secondary | ICD-10-CM

## 2018-06-11 NOTE — Telephone Encounter (Signed)
[facility] called pt

## 2018-06-13 ENCOUNTER — Ambulatory Visit (HOSPITAL_BASED_OUTPATIENT_CLINIC_OR_DEPARTMENT_OTHER)
Admission: RE | Admit: 2018-06-13 | Discharge: 2018-06-13 | Disposition: A | Discharge status: Home / Self Care | Payer: Medicaid Other | Source: Ambulatory Visit | Attending: Obstetrics and Gynecology | Admitting: Obstetrics and Gynecology

## 2018-06-13 ENCOUNTER — Other Ambulatory Visit: Payer: Self-pay

## 2018-06-13 ENCOUNTER — Other Ambulatory Visit: Payer: Self-pay | Admitting: Obstetrics and Gynecology

## 2018-06-13 ENCOUNTER — Ambulatory Visit
Admission: RE | Admit: 2018-06-13 | Discharge: 2018-06-13 | Disposition: A | Discharge status: Home / Self Care | Payer: Medicaid Other | Source: Ambulatory Visit | Attending: Maternal & Fetal Medicine | Admitting: Maternal & Fetal Medicine

## 2018-06-13 DIAGNOSIS — Z3A13 13 weeks gestation of pregnancy: Secondary | ICD-10-CM | POA: Insufficient documentation

## 2018-06-13 DIAGNOSIS — Z1371 Encounter for nonprocreative screening for genetic disease carrier status: Secondary | ICD-10-CM

## 2018-06-13 DIAGNOSIS — Z369 Encounter for antenatal screening, unspecified: Secondary | ICD-10-CM

## 2018-06-13 DIAGNOSIS — Z3689 Encounter for other specified antenatal screening: Secondary | ICD-10-CM | POA: Diagnosis not present

## 2018-06-13 DIAGNOSIS — O285 Abnormal chromosomal and genetic finding on antenatal screening of mother: Secondary | ICD-10-CM

## 2018-06-13 NOTE — Progress Notes (Addendum)
Referring physician:  [facility] 45 minute consultation  [name] was referred to Christine Arellano for an ultrasound and genetic counseling due to an abnormal result on Panorama cell free fetal DNA testing performed previously at [facility]. We first reviewed chromosomes and the detection of chromosome conditions including trisomy 51, 44 and 21 as well as triploidy with the use of cell free fetal DNA testing.  Circulating cell free fetal DNA testing may be used to determine whether or not the baby may have either Down syndrome, trisomy 51, trisomy 9 or with some labs, triploidy. This test utilizes a maternal blood sample and DNA sequencing technology to isolate circulating cell free fetal DNA from maternal plasma. The fetal DNA can then be analyzed for DNA sequences that are derived from the three most common chromosomes involved in aneuploidy, chromosomes 13, 18, and 21. If the overall amount of DNA is greater than the expected Arellano for any of these chromosomes, aneuploidy is suspected. This testing is able to provide a means of determining the chance for one of these common chromosome conditions without requiring an invasive procedure and traditional karyotype analysis. We explained that we do not consider it a replacement for invasive testing and karyotype analysis.  A negative result from this testing is very reassuring, though not a guarantee of a normal chromosome complement for the baby. An abnormal result is certainly suggestive of an abnormal chromosome complement, though we would still recommend CVS or amniocentesis to confirm any findings from this testing.  The results for Christine Arellano showed a result reported as an "atypical finding".  After speaking with the laboratory directly, there appears to be an unusual finding outside the usual scope of the test involving chromosome 13 that is not of maternal origin.  These results are very nonspecific and could indicate a  deletion, duplication, mosaicism in the fetus or the placenta, or an unbalanced complement from a translocation.  Additional testing is needed to better characterize what, if any, chromosome difference may be present.  The following testing options were made available to the patient:    Detailed anatomy ultrasound can be offered after [redacted] weeks gestation to assess for fetal growth and anatomy.  This noninvasive test can detect structural differences which may be suggestive of a chromosome condition in the pregnancy.  Ultrasound cannot diagnose or rule out chromosome conditions.  Amniocentesis involves the removal of a small amount of amniotic fluid from the sac surrounding the fetus with the use of a thin needle inserted through the maternal abdomen and uterus.  Ultrasound guidance is used throughout the procedure.  Fetal cells are directly evaluated and > 98% of neural tube defects can be detected.  The main risks to this procedure include complications leading to miscarriage in less than 1 in 200 cases (0.5%).  This is the most accurate testing (>99.5%) to evaluate the number and structure of the fetal chromosomes.    CVS (Chorionic villus sampling) testing is another invasive testing option to assess chromosomal information in the first trimester.  However, this testing involves a sampling of placental cells, which may be less helpful in this situation in case the possibly abnormality is of placental origin and does not represent the fetus.  Another screening option is Christine Arellano which uses similar technology as the prior cell free fetal DNA testing to look more deeply at the chromosomal complement.  However, this is also a screening test and would be followed up with diagnostic testing if abnormal.  Christine Arellano elected to have an ultrasound today.  The results of this ultrasound showed no obvious anomalies at 13 weeks, 3 days gestation.  She elected to schedule an early amniocentesis on June 24, 2018, at which time we will order chromosome analysis, AFAFP and chromosomal microarray. We will also recommend a detailed fetal anatomic survey at [redacted] weeks gestation.  We inquired about the family history and pregnancy.  The patient reported no complications or exposures that are expected to increase the risk for birth defects.  There were no other family members reported with birth defects, developmental differences, recurrent pregnancy loss or known genetic conditions.  She did report one paternal uncle who passed away at birth because he was too large.  She knows no details about that history.  The patient is of Caucasian background and her partner is [place] Optometrist. A review of Christine Arellano's labs showed that she had prior sickle cell screening that was normal.  However, this testing was a hemoglobin solubility only and did not include a hemoglobin fractionation.  While this is reliable for hemoglobin S (sickle) trait, it cannot assess for other hemoglobin variants including C, E and thalassemias which may be clinically significant.  We therefore offered a repeat test for a more complete assessment.  She also elected to have carrier screening for CF and SMA today.  We welcome the opportunity for continued involvement with this family and remain available should questions or concerns arise. Christine Arellano indicated clearly that if there were to be a significant chromosome condition in the pregnancy, she would like to discuss options for the pregnancy in detail.  She is aware of the 20 week limit for termination of pregnancy in [place]. We can be contacted at [phone].   Wilburt Finlay, MS, CGC   Labs drawn:  Hemoglobinopathy evaluation, CF and SMA carrier screening.

## 2018-06-17 LAB — HEMOGLOBINOPATHY EVALUATION
HGB S QUANTITAION: 0 %
Hgb A2 Quant: 2.3 % (ref 1.8–3.2)
Hgb A: 97.7 % (ref 96.4–98.8)
Hgb C: 0 %
Hgb F Quant: 0 % (ref 0.0–2.0)
Hgb Variant: 0 %

## 2018-06-19 LAB — CYSTIC FIBROSIS GENE TEST

## 2018-06-20 ENCOUNTER — Other Ambulatory Visit: Payer: Self-pay

## 2018-06-20 ENCOUNTER — Telehealth: Payer: Self-pay | Admitting: Obstetrics and Gynecology

## 2018-06-20 DIAGNOSIS — O285 Abnormal chromosomal and genetic finding on antenatal screening of mother: Secondary | ICD-10-CM

## 2018-06-20 DIAGNOSIS — Z1371 Encounter for nonprocreative screening for genetic disease carrier status: Secondary | ICD-10-CM

## 2018-06-20 NOTE — Telephone Encounter (Signed)
We spoke with Christine Arellano on 06/20/2018 to discuss the results of her recent cystic fibrosis and hemoglobinopathy carrier screening.     Hemoglobinopathy testing for Christine Arellano showed normal adult hemoglobin (AA).   This result, combined with her normal MCV (88), indicates that she is not a carrier for sickle cell trait or other hemoglobinopathies.  Because these conditions are inherited as recessive conditions, we would not recommend further testing on the father of the baby for this condition.  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  We discussed the natural history and autosomal recessive inheritance pattern associated with this condition. We offered the option of either carrier screening for Christine Arellano to determine more specifically the chance for CF in this pregnancy.  He is of African American background and therefore has a chance of 1 in 32 to be a carrier for this condition.  Without any additional testing, the chance for this pregnancy to be affected with CF is (1X1/61X1/4) 1 in 124.  If he were to have testing, we can modify this risk. Also, because she is planning to have an amniocentesis on Monday, January 27 at her visit (due to the prior abnormal Panorama results), we can hold cell for CF testing if needed based upon the results on Christine Arellano.    If there are any questions or concerns, we can be reached at [phone].    Wilburt Finlay, MS, CGC

## 2018-06-20 NOTE — Progress Notes (Signed)
Pt seen by me, agree with assessment and plan as outlined in [place] note

## 2018-06-24 ENCOUNTER — Other Ambulatory Visit: Payer: Self-pay | Admitting: Maternal & Fetal Medicine

## 2018-06-24 ENCOUNTER — Ambulatory Visit
Admission: RE | Admit: 2018-06-24 | Discharge: 2018-06-24 | Disposition: A | Discharge status: Home / Self Care | Payer: Medicaid Other | Source: Ambulatory Visit | Attending: Maternal & Fetal Medicine | Admitting: Maternal & Fetal Medicine

## 2018-06-24 ENCOUNTER — Other Ambulatory Visit: Payer: Self-pay

## 2018-06-24 DIAGNOSIS — Z3A15 15 weeks gestation of pregnancy: Secondary | ICD-10-CM | POA: Insufficient documentation

## 2018-06-24 DIAGNOSIS — Z1371 Encounter for nonprocreative screening for genetic disease carrier status: Secondary | ICD-10-CM | POA: Diagnosis not present

## 2018-06-24 DIAGNOSIS — O285 Abnormal chromosomal and genetic finding on antenatal screening of mother: Secondary | ICD-10-CM | POA: Insufficient documentation

## 2018-06-24 NOTE — Progress Notes (Addendum)
Christine Arellano was seen at [facility] today at 15 weeks, 0 days for an amniocentesis due to the prior abnormal Panorama testing (see note from 06/13/18).  Amniocentesis was performed by Dr. Manfred Shirts.  Given the recent CF results that showed Christine Arellano is a carrier of deltF508, we again offered carrier testing to her partner.  He did not come today and she declined testing for him.  She desired testing on the fetus with the understanding that it is ideal to test both parents prior to testing. Sample was sent to [facility] via direct courrier for the following tests, listed here in priority order:  Chromosome analysis with reflex to Reveal SNP Microarray ([facility] #[id number]) Fetal Cystic Fibrosis 97 mutation panel ([facility] #[id number]) Amniotic fluid AFP testing ([facility] #[id number])  One purple top tube of maternal blood was also sent for maternal cell contamination studies (#[id number]) for both CF and microarray  I also spoke with the genetic counselor at [facility] to let them to know to anticipate the sample and discuss testing priority.    We will contact Christine Arellano with results as they become available and she is scheduled to return to clinic for a detailed anatomy ultrasound at [redacted] weeks gestation.  Depending upon these test results, this may be moved up to allow for informed decision-making.   Christine Finlay, MS, CGC

## 2018-06-24 NOTE — Progress Notes (Signed)
Pt had amniocentesis today. Dr. Diamantina Monks got the consent signed prior to beginning of procedure.  Pre- vital signs taken and entered in Epic.  Pt had amnio preformed with 2 insertion sites and tolerated procedure well. Samples sent to [facility] post procedure as well as Post Vital signs.  Pt given post procedure Amnio Care handout after procedure and instructions on how to get in touch with MFM on call if any concerns arise. Pt scheduled for f/u appointment with DPN.

## 2018-06-26 LAB — SMN1 COPY NUMBER ANALYSIS (SMA CARRIER SCREENING)

## 2018-06-27 ENCOUNTER — Other Ambulatory Visit: Payer: Self-pay

## 2018-06-27 ENCOUNTER — Telehealth: Payer: Self-pay | Admitting: Obstetrics and Gynecology

## 2018-06-27 NOTE — Telephone Encounter (Signed)
Christine Arellano was informed that the results of her SMA carrier testing are now available and the results showed that she is a carrier for the condition.  I offered testing to her partner, as this is a recessive condition. He had previously declined being tested for Cystic fibrosis when she was found to be a carrier of that.  She elected to have testing added to the tests performed on her amniocentesis rather than try to have him tested.  Given the fact that he is of African American ancestry, his chance to be a carrier for SMA is 1 in 65.  Without any additional testing, the chance for this pregnancy to be affected with SMA is 1 in 288.  Results from the amniocentesis will be called to the patient when they become available, likely in 2-3 weeks.  We may be reached at [phone].   Wilburt Finlay, MS, CGC

## 2018-06-28 ENCOUNTER — Encounter: Payer: Medicaid Other | Admitting: Obstetrics and Gynecology

## 2018-06-28 ENCOUNTER — Encounter: Payer: Medicaid Other | Admitting: Certified Nurse Midwife

## 2018-07-08 ENCOUNTER — Telehealth: Payer: Self-pay | Admitting: Obstetrics and Gynecology

## 2018-07-08 NOTE — Telephone Encounter (Signed)
RESULTS NOTE  Patient called requesting her results from her amniocentesis performed on 06/24/2018.  This was done for a Panorama suggestive of Tri 13 .  I reviewed [facility] results showing 3 XX with normal structure on karyptype.  [name] called [facility] re CF and SMA testing on the amnio. Those results will not be available until the end of this week to early next week.   Christine Arellano

## 2018-07-11 ENCOUNTER — Other Ambulatory Visit: Payer: Self-pay

## 2018-07-15 ENCOUNTER — Ambulatory Visit
Admission: RE | Admit: 2018-07-15 | Discharge: 2018-07-15 | Disposition: A | Discharge status: Home / Self Care | Payer: Medicaid Other | Source: Ambulatory Visit | Attending: Maternal & Fetal Medicine | Admitting: Maternal & Fetal Medicine

## 2018-07-15 DIAGNOSIS — Z3A18 18 weeks gestation of pregnancy: Secondary | ICD-10-CM | POA: Diagnosis not present

## 2018-07-15 DIAGNOSIS — O285 Abnormal chromosomal and genetic finding on antenatal screening of mother: Secondary | ICD-10-CM | POA: Insufficient documentation

## 2018-07-18 LAB — CHROMOSOME AFP/ACHE/HBF, AMNIOTIC FLUID
ACHE, Amn: NEGATIVE
AFP, Amniotic Fluid (mcg/ml): 14.4 ug/mL
Cells Analyzed: 15
Cells Counted: 15
Cells Karyotyped: 2
Colonies: 15
GESTATIONAL AGE(WKS): 15
GTG Band Resolution Achieved: 450
MOM, Amniotic Fluid: 0.95

## 2018-07-18 LAB — MCC TRACKING

## 2018-07-19 LAB — MATERNAL CELL CONTAMINATION

## 2018-07-20 LAB — CHROMOSOME MICROARRAY REFLEX, AMN FLD

## 2018-07-20 LAB — MATERNAL CELL CONTAMINATION

## 2018-07-20 LAB — CHROMOSOME ANALYSIS W REFLEX TO SNP, AMNIOTIC

## 2018-07-20 LAB — SPECIMEN STATUS REPORT

## 2018-07-22 ENCOUNTER — Telehealth: Payer: Self-pay | Admitting: Genetics

## 2018-07-22 ENCOUNTER — Ambulatory Visit
Admission: RE | Admit: 2018-07-22 | Discharge: 2018-07-22 | Disposition: A | Discharge status: Home / Self Care | Payer: Medicaid Other | Source: Ambulatory Visit | Attending: Maternal & Fetal Medicine | Admitting: Maternal & Fetal Medicine

## 2018-07-22 ENCOUNTER — Other Ambulatory Visit: Payer: Self-pay

## 2018-07-22 DIAGNOSIS — O285 Abnormal chromosomal and genetic finding on antenatal screening of mother: Secondary | ICD-10-CM | POA: Diagnosis not present

## 2018-07-22 DIAGNOSIS — Z3A19 19 weeks gestation of pregnancy: Secondary | ICD-10-CM | POA: Insufficient documentation

## 2018-07-22 LAB — PRENATAL SMN1 COPY NUMBER

## 2018-07-22 LAB — CYSTIC FIBROSIS 97, FETAL

## 2018-07-22 NOTE — Telephone Encounter (Signed)
Disclosed the final results from Christine Arellano's amniocentesis:  1) Microarray:  We reviewed that the whole genome chromosome SNP microarray (Reveal) analysis was normal. No significant DNA copy number changes or copy neutral regions within the 2.695 million region specific SNP and structural targets were detected under the present reporting criteria.   Of note, normal microarray studies cannot rule out single gene disorders.  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    SMA is an autosomal recessive disease of variable age of onset and severity caused by mutations, most often deletions or gene conversions in the survival motor neuron (SMN1) gene.  Among individuals with a clinical diagnosis of SMA, 94% lack both copies of the SMN1 gene (ie 0 copies of SMN1), while other affected individuals may have both a deletion and a non-deletion mutation. Additionally, de novo mutations have been reported in ~2% of SMA patients.   The dosage analysis cannot detect a) non deletion mutations within the SMN1 gene 2) germline mosaicism 3) mutations in genes other than SMN1.  The patient was informed that while the SMN1 analysis returned with 2 copies, we cannot completely rule out the chance for the fetus to be affected.    Christine Arellano has an ultrasound appointment later this afternoon.  We are happy to meet with her to review any additional questions or concerns that she has regarding these results.

## 2018-07-25 ENCOUNTER — Telehealth: Payer: Self-pay | Admitting: Obstetrics and Gynecology

## 2018-07-25 NOTE — Telephone Encounter (Signed)
Christine Arellano was notified of Christine results of Christine fetal testing for Cystic fibrosis (CF) from her recent amniocentesis.  As previously noted, Christine Arellano was found to be a carrier for delta F508, Christine most common variant in Christine gene for CF. Her partner declined to be tested, but Christine Arellano desired fetal testing on Christine amniocentesis sample that was obtained on 06/24/18 for additional chromosomal testing.  Christine fetal results show that Christine fetus has one copy of Christine delta F508 mutation and no second mutation in this gene was identified.  This is consistent with Christine baby being a carrier for this condition.   This testing evaluates Christine 97 most common changes in Christine gene for CF and can detect 93% of mutation in Christine Caucasian population and 81% of changes in African American individuals.  Christine Arellano is Caucasian and her partner is African American.   Because this testing cannot detect all changes that may cause CF, we cannot eliminate Christine chance that this fetus would be affected. As discussed, Christine residual chance for this baby to affected with CF is estimated to be less than 1 in 300.  If there are any questions or concerns, please feel free to contact our office at [phone].      Wilburt Finlay, MS, CGC

## 2018-08-01 ENCOUNTER — Other Ambulatory Visit: Payer: Self-pay

## 2018-08-01 DIAGNOSIS — O285 Abnormal chromosomal and genetic finding on antenatal screening of mother: Secondary | ICD-10-CM

## 2018-08-05 ENCOUNTER — Ambulatory Visit
Admission: RE | Admit: 2018-08-05 | Discharge: 2018-08-05 | Disposition: A | Discharge status: Home / Self Care | Payer: Medicaid Other | Source: Ambulatory Visit | Attending: Obstetrics and Gynecology | Admitting: Obstetrics and Gynecology

## 2018-08-05 DIAGNOSIS — Z141 Cystic fibrosis carrier: Secondary | ICD-10-CM | POA: Diagnosis not present

## 2018-08-05 DIAGNOSIS — Z3A21 21 weeks gestation of pregnancy: Secondary | ICD-10-CM | POA: Insufficient documentation

## 2018-08-05 DIAGNOSIS — O285 Abnormal chromosomal and genetic finding on antenatal screening of mother: Secondary | ICD-10-CM

## 2018-08-12 DIAGNOSIS — O351XX1 Maternal care for (suspected) chromosomal abnormality in fetus, fetus 1: Secondary | ICD-10-CM | POA: Diagnosis not present

## 2018-08-12 DIAGNOSIS — Z3A21 21 weeks gestation of pregnancy: Secondary | ICD-10-CM | POA: Diagnosis not present

## 2018-08-12 DIAGNOSIS — O350XX0 Maternal care for (suspected) central nervous system malformation in fetus, not applicable or unspecified: Secondary | ICD-10-CM | POA: Diagnosis not present

## 2018-08-12 DIAGNOSIS — Z3A22 22 weeks gestation of pregnancy: Secondary | ICD-10-CM | POA: Diagnosis not present

## 2018-08-19 DIAGNOSIS — O350XX0 Maternal care for (suspected) central nervous system malformation in fetus, not applicable or unspecified: Secondary | ICD-10-CM | POA: Diagnosis not present

## 2018-08-19 DIAGNOSIS — Q043 Other reduction deformities of brain: Secondary | ICD-10-CM | POA: Diagnosis not present

## 2018-08-19 DIAGNOSIS — Z3A22 22 weeks gestation of pregnancy: Secondary | ICD-10-CM | POA: Diagnosis not present

## 2018-08-26 ENCOUNTER — Telehealth: Payer: Self-pay | Admitting: Obstetrics and Gynecology

## 2018-08-26 NOTE — Telephone Encounter (Signed)
I spoke with Christine Arellano following her discussion the previous evening with Dr. Debara Pickett from [facility] about the results of the fetal MRI performed at [facility] on 08/19/18.  Given the uncertainly of the prognosis for this fetus based upon the results of the recent ultrasound and MRI studies, the patient requested information about pregnancy termination options.  Given her late gestational age, she was already made aware that she would have to travel out of state.  Currently, the closest clinic would be [facility] in [place], [place].  She was provided with contact information, cost estimate and overview of the services offered at this clinic. Per their policy, the patient must call to arrange the appointment and discuss financial assistance options.  Our clinic faxed records of her prior C sections, ultrasound and MRI reports to the clinic directly on 08/26/18.  Both and patient and the [facility] clinic have my contact information to use as needed.  Christine Finlay, MS, CGC

## 2018-08-28 HISTORY — PX: THERAPEUTIC ABORTION: SHX798

## 2018-08-29 DIAGNOSIS — O359XX0 Maternal care for (suspected) fetal abnormality and damage, unspecified, not applicable or unspecified: Secondary | ICD-10-CM | POA: Diagnosis not present

## 2018-09-06 ENCOUNTER — Encounter: Payer: Medicaid Other | Admitting: Obstetrics and Gynecology

## 2018-11-06 ENCOUNTER — Ambulatory Visit (INDEPENDENT_AMBULATORY_CARE_PROVIDER_SITE_OTHER): Payer: Medicaid Other | Admitting: Obstetrics and Gynecology

## 2018-11-06 ENCOUNTER — Other Ambulatory Visit: Payer: Self-pay

## 2018-11-06 ENCOUNTER — Encounter: Payer: Self-pay | Admitting: Obstetrics and Gynecology

## 2018-11-06 VITALS — BP 112/77 | HR 62 | Ht 62.0 in | Wt 171.0 lb

## 2018-11-06 DIAGNOSIS — N921 Excessive and frequent menstruation with irregular cycle: Secondary | ICD-10-CM | POA: Diagnosis not present

## 2018-11-06 DIAGNOSIS — Z713 Dietary counseling and surveillance: Secondary | ICD-10-CM

## 2018-11-06 DIAGNOSIS — L709 Acne, unspecified: Secondary | ICD-10-CM | POA: Diagnosis not present

## 2018-11-06 DIAGNOSIS — Z6831 Body mass index (BMI) 31.0-31.9, adult: Secondary | ICD-10-CM

## 2018-11-06 MED ORDER — CYANOCOBALAMIN 1000 MCG/ML IJ SOLN: 1000.0000 ug | INTRAMUSCULAR | 1 refills | Status: DC

## 2018-11-06 MED ORDER — PHENTERMINE HCL 37.5 MG PO TABS: 37.5000 mg | ORAL_TABLET | Freq: Every day | ORAL | 2 refills | Status: DC

## 2018-11-06 NOTE — Progress Notes (Signed)
  Subjective:     Patient ID: Christine Arellano, female   DOB: Aug 14, 1990, 28 y.o.   MRN: [id number]  HPI Desires restart weight loss medications. States she has been exercising 5 days a week (walking). Has been dieting and drinking increased water. Weight not going down.  Has low been doing low carb. Terminated pregnancy due to fetal abnormality on April 8th. Is still spotting intermittently. Restarted OCPs and hasn't had what she would consider a normal period since then, just the spotting.   Also she is concerned about worsening acne on face, neck and chest. She desires dermatology referral  Review of Systems  All other systems reviewed and are negative.      Objective:   Physical Exam A&Ox4 Well groomed female in no distress. Blood pressure 112/77, pulse 62, height 5\' 2"  (1.575 m), weight 171 lb (77.6 kg), last menstrual period 09/04/2018, unknown if currently breastfeeding.  Body mass index is 31.28 kg/m.  Pelvic exam deferred. Ance vulgaris noted on face and chest and shoulders.    Assessment:     BMI 31 Weight loss management BTB on OCPs acne    Plan:     Will restart weight loss medications with adipex and B12. Labs obtained- wll follow up accordingly. B12 1090mcg IM given today. RTC 1 month for wt/bp/B12 with Nurse Referred to dermatology.   Weston Kallman,CNM

## 2018-11-07 ENCOUNTER — Telehealth: Payer: Self-pay | Admitting: Obstetrics and Gynecology

## 2018-11-07 LAB — B12 AND FOLATE PANEL
Folate: 7.6 ng/mL
Vitamin B-12: 538 pg/mL (ref 232–1245)

## 2018-11-07 LAB — CBC
Hematocrit: 42 % (ref 34.0–46.6)
Hemoglobin: 13.4 g/dL (ref 11.1–15.9)
MCH: 28 pg (ref 26.6–33.0)
MCHC: 31.9 g/dL (ref 31.5–35.7)
MCV: 88 fL (ref 79–97)
Platelets: 278 x10E3/uL (ref 150–450)
RBC: 4.78 x10E6/uL (ref 3.77–5.28)
RDW: 12.6 % (ref 11.7–15.4)
WBC: 6.3 x10E3/uL (ref 3.4–10.8)

## 2018-11-07 LAB — FERRITIN: Ferritin: 37 ng/mL (ref 15–150)

## 2018-11-07 LAB — VITAMIN D 25 HYDROXY (VIT D DEFICIENCY, FRACTURES): Vit D, 25-Hydroxy: 33.2 ng/mL (ref 30.0–100.0)

## 2018-11-07 NOTE — Telephone Encounter (Signed)
Spoke with pt -Christine Arellano

## 2018-11-07 NOTE — Telephone Encounter (Signed)
The patient called and stated that she would like to speak with her nurse in regards to her having some questions in regards to her results stating she has a Vitamin D deficiency. Please advise.

## 2018-11-14 ENCOUNTER — Telehealth: Payer: Self-pay | Admitting: Obstetrics and Gynecology

## 2018-11-14 NOTE — Telephone Encounter (Signed)
The patient called and stated that she was advised by Melody to call the office and notify us if her cycle has not started yet. The patient is also requesting a call back. Please advise.

## 2018-11-15 NOTE — Telephone Encounter (Signed)
I pt called and stated that she needs to speak with Amy and also stated that she never received a call back in regards to her birth control yesterday. Please advise.

## 2018-11-19 NOTE — Telephone Encounter (Signed)
Spoke with pt

## 2018-11-25 ENCOUNTER — Telehealth: Payer: Self-pay | Admitting: Obstetrics and Gynecology

## 2018-11-25 NOTE — Telephone Encounter (Signed)
The pt called and stated that she needs a different birth control she is concerned about her constant spotting. Please advise.

## 2018-11-26 DIAGNOSIS — Z79899 Other long term (current) drug therapy: Secondary | ICD-10-CM | POA: Diagnosis not present

## 2018-11-26 DIAGNOSIS — L700 Acne vulgaris: Secondary | ICD-10-CM | POA: Diagnosis not present

## 2018-12-03 ENCOUNTER — Telehealth: Payer: Self-pay

## 2018-12-03 NOTE — Telephone Encounter (Signed)
Coronavirus (COVID-19) Are you at risk?  Are you at risk for the Coronavirus (COVID-19)?  To be considered HIGH RISK for Coronavirus (COVID-19), you have to meet the following criteria:  . Traveled to [place], [place], [place], [place] or [place]; or in the [place] to [place], [place], [place], or [place]; and have fever, cough, and shortness of breath within the last 2 weeks of travel OR . Been in close contact with a person diagnosed with COVID-19 within the last 2 weeks and have fever, cough, and shortness of breath . IF YOU DO NOT MEET THESE CRITERIA, YOU ARE CONSIDERED LOW RISK FOR COVID-19.  What to do if you are HIGH RISK for COVID-19?  . If you are having a medical emergency, call 911. . Seek medical care right away. Before you go to a doctor's office, urgent care or emergency department, call ahead and tell them about your recent travel, contact with someone diagnosed with COVID-19, and your symptoms. You should receive instructions from your physician's office regarding next steps of care.  . When you arrive at healthcare provider, tell the healthcare staff immediately you have returned from visiting [place], [place], [place], [place] or [place]; or traveled in the [place] to [place], [place], [place], or [place]; in the last two weeks or you have been in close contact with a person diagnosed with COVID-19 in the last 2 weeks.   . Tell the health care staff about your symptoms: fever, cough and shortness of breath. . After you have been seen by a medical provider, you will be either: o Tested for (COVID-19) and discharged home on quarantine except to seek medical care if symptoms worsen, and asked to  - Stay home and avoid contact with others until you get your results (4-5 days)  - Avoid travel on public transportation if possible (such as bus, train, or airplane) or o Sent to the Emergency Department by EMS for evaluation, COVID-19 testing, and possible  admission depending on your condition and test results.  What to do if you are LOW RISK for COVID-19?  Reduce your risk of any infection by using the same precautions used for avoiding the common cold or flu:  . Wash your hands often with soap and warm water for at least 20 seconds.  If soap and water are not readily available, use an alcohol-based hand sanitizer with at least 60% alcohol.  . If coughing or sneezing, cover your mouth and nose by coughing or sneezing into the elbow areas of your shirt or coat, into a tissue or into your sleeve (not your hands). . Avoid shaking hands with others and consider head nods or verbal greetings only. . Avoid touching your eyes, nose, or mouth with unwashed hands.  . Avoid close contact with people who are sick. . Avoid places or events with large numbers of people in one location, like concerts or sporting events. . Carefully consider travel plans you have or are making. . If you are planning any travel outside or inside the [place], visit the CDC's Travelers' Health webpage for the latest health notices. . If you have some symptoms but not all symptoms, continue to monitor at home and seek medical attention if your symptoms worsen. . If you are having a medical emergency, call 911.   ADDITIONAL HEALTHCARE OPTIONS FOR PATIENTS  [facility] / e-Visit: [email or web address]         [facility]: [phone]  [facility]: [phone]                   [facility]: [phone]   Pre-screen negative, DM.   

## 2018-12-04 ENCOUNTER — Encounter: Payer: Self-pay | Admitting: Obstetrics and Gynecology

## 2018-12-04 ENCOUNTER — Other Ambulatory Visit: Payer: Self-pay

## 2018-12-04 ENCOUNTER — Telehealth: Payer: Medicaid Other | Admitting: Family

## 2018-12-04 ENCOUNTER — Ambulatory Visit (INDEPENDENT_AMBULATORY_CARE_PROVIDER_SITE_OTHER): Payer: Medicaid Other | Admitting: Obstetrics and Gynecology

## 2018-12-04 VITALS — BP 109/69 | HR 90 | Ht 62.0 in | Wt 159.2 lb

## 2018-12-04 DIAGNOSIS — Z713 Dietary counseling and surveillance: Secondary | ICD-10-CM | POA: Diagnosis not present

## 2018-12-04 DIAGNOSIS — Z6831 Body mass index (BMI) 31.0-31.9, adult: Secondary | ICD-10-CM

## 2018-12-04 DIAGNOSIS — L559 Sunburn, unspecified: Secondary | ICD-10-CM | POA: Diagnosis not present

## 2018-12-04 MED ORDER — TRIAMCINOLONE ACETONIDE 0.1 % EX CREA: 1.0000 "application " | TOPICAL_CREAM | Freq: Two times a day (BID) | CUTANEOUS | 2 refills | Status: DC

## 2018-12-04 MED ORDER — CYANOCOBALAMIN 1000 MCG/ML IJ SOLN
1000.0000 ug | Freq: Once | INTRAMUSCULAR | Status: AC
  Administered 2018-12-04: 1000 ug via INTRAMUSCULAR

## 2018-12-04 NOTE — Progress Notes (Signed)
Pt presents for wt bp b12 visit. Wt- 159.2 Down 12#. Side effect is dry mouth. Encouraged more water. Pt is exercising.  B12 given. Used B12 in house.   Pt states she has been spotting on ocp. Advised to complete 3 packs if still spotting to reach out to [facility] via my chart or discuss at [facility].  Sunburn on bilateral shins and top of feet. Looks purple.  Advised aloe and ibuprofen. Should consult pcp.  Pt voices understanding.

## 2018-12-04 NOTE — Progress Notes (Signed)
We are sorry that you are not feeling well.  Here is how we plan to help!  Based on what you have shared with me, I'd like to share with you a treatment plan for sunburn.   I have sent a prescription of Kenalog 0.1 % twice a day. Do not apply on your face or groin.   Approximately 5 minutes was spent documenting and reviewing patient's chart.    Most sunburn is a first degree burn that turns the skin pink or red.  It can be painful to touch.  If you stayed in the sun for a prolonged period this might have progressed to a second degree burn with blistering!  Usually the pain and swelling starts after about 4 hours, peaks at 24 hours and begins to improve after 48 hours or about 2 days.  REMEMBER prolonged exposure to the sun increases your risk of skin cancer so use sunscreen before you go outside!  We will give you more information about sunscreen use later in your care plan.  Your sunburn can be managed by self-care at home.  Please use the following care guide to manage your sunburn.  If you symptoms worsen, you have other questions or concerns, or you develop any of the warnings signs listed in your care plan you will need to seek a face to face visit with a provider without waiting!  Home Care Advice for Treating Mild Sunburn:  1. Take Ibuprofen (Advil, Motrin) for pain relief as soon as possible.  The adult dosage is up to 600 mg every 6 hours.  Starting within 6 hours of sun exposure may greatly reduce your discomfort.  If you cannot take Ibuprofen you may use Acetaminophen instead.  Do not take Ibuprofen if you have stomach problems, kidney disease or are pregnant.  Do not take Ibuprofen if you have been told by your doctor or pharmacist to avoid this class of drugs.  Do not take Acetaminophen if you have liver disease.  Read the package warnings on any medication that you take!  2.  Use a steroid cream on the affected skin.  If you apply an over the counter steroid       cream  as soon as possible and repeat it three times a day it may reduce the pain and      and swelling.  Until you get the steroid cream you may start with a moistening cream      cream or aloe gel.  3. For second or third degree sunburn with painful blistering, you can use an over the         counter product Burn Jel Plus Pain Relieving Gel. Apply in a thick even layer over the     affected area not more than 3 to 4 times daily If you need to cover the area to protect      it from friction of clothing, you can use Moist Burn Pads such as Hydrogel Burn Pads       which are available over the counter.  4.  Apply cool compresses to the burned areas several times a day.  5.  Avoid soap on the sunburned areas.  6.  Drink plenty of water.  It is easy to get dehydrated from prolong time in the sun      Outdoors.  7.  For any broken blisters:  Trim off the dead skin with fine scissors.  It is wise to clean the scissor with alcohol before  use.  Apply antibiotic ointments to the blister.  Apply twice a day for three days.  There are triple antibiotic ointments with topical pain relievers available at stores.  Caution:leave intact blisters alone.  They are protecting the skin and will allow it to heal.  8.  Taking Vitamin C orally may reduce sun damage to your skin.  Follow the      Instructions on the bottle.  The recommended adult dosage is 2 grams.  What to Expect:  1. Pain usually stops after 2 or 3 days. 2. Skin flaking and peeling usually occurs for 3-7 days after a sunburn.  Call your provider if:  1. You feel very weak or have difficulty standing. 2. Blister develops on your face. 3. You become sensitive to light because of eye pain. 4. Your skin looks infected (red streaks, puss or worsening tenderness after 48 hours. 5. You feel you should be seen.  Preventing Sunburns:  1. Apply 20-30 SPF sunscreen to your skin before going into the sun. 2. Reapply every 2-4 hours or after sweating  or swimming. 3. Sunscreens protect from sunburns but do not completely prevent skin damage.  Nancy Fetter exposure still increases your risk of premature aging and skin cancers.  Your e-visit answers were reviewed by a board certified advanced clinical practitioner to complete your personal care plan.  Depending on the condition, your plan could have included both over the counter or prescription medications.  If there is a problem please reply  once you have received a response from your provider.  Your safety is important to [place].  If you have drug allergies check your prescription carefully.    You can use MyChart to ask questions about today's visit, request a non-urgent call back, or ask for a work or school excuse for 24 hours related to this e-Visit. If it has been greater than 24 hours you will need to follow up with your provider, or enter a new e-Visit to address those concerns.  You will get an e-mail in the next two days asking about your experience.  I hope that your e-visit has been valuable and will speed your recovery. Thank you for using e-visits.

## 2018-12-05 ENCOUNTER — Telehealth: Payer: Self-pay | Admitting: Obstetrics and Gynecology

## 2018-12-05 NOTE — Telephone Encounter (Signed)
The patient called and stated that she needs to switch her birth control because she is still bleeding. Please advise.

## 2018-12-06 ENCOUNTER — Other Ambulatory Visit: Payer: Self-pay | Admitting: Obstetrics and Gynecology

## 2018-12-06 MED ORDER — DESOGESTREL-ETHINYL ESTRADIOL 0.15-0.02/0.01 MG (21/5) PO TABS: 1.0000 | ORAL_TABLET | Freq: Every day | ORAL | 11 refills | Status: DC

## 2018-12-06 NOTE — Telephone Encounter (Signed)
New prescription sent in, please start new one when you finish the pack you are on.

## 2018-12-26 DIAGNOSIS — Z79899 Other long term (current) drug therapy: Secondary | ICD-10-CM | POA: Diagnosis not present

## 2018-12-26 DIAGNOSIS — L700 Acne vulgaris: Secondary | ICD-10-CM | POA: Diagnosis not present

## 2019-01-06 ENCOUNTER — Telehealth: Payer: Self-pay

## 2019-01-06 NOTE — Telephone Encounter (Signed)
Coronavirus (COVID-19) Are you at risk?  Are you at risk for the Coronavirus (COVID-19)?  To be considered HIGH RISK for Coronavirus (COVID-19), you have to meet the following criteria:  . Traveled to [place], [place], [place], [place] or [place]; or in the [place] to [place], [place], [place], or [place]; and have fever, cough, and shortness of breath within the last 2 weeks of travel OR . Been in close contact with a person diagnosed with COVID-19 within the last 2 weeks and have fever, cough, and shortness of breath . IF YOU DO NOT MEET THESE CRITERIA, YOU ARE CONSIDERED LOW RISK FOR COVID-19.  What to do if you are HIGH RISK for COVID-19?  Marland Kitchen If you are having a medical emergency, call 911. . Seek medical care right away. Before you go to a doctor's office, urgent care or emergency department, call ahead and tell them about your recent travel, contact with someone diagnosed with COVID-19, and your symptoms. You should receive instructions from your physician's office regarding next steps of care.  . When you arrive at healthcare provider, tell the healthcare staff immediately you have returned from visiting [place], [place], [place], [place] or [place]; or traveled in the [place] to [place], [place], [place], or [place]; in the last two weeks or you have been in close contact with a person diagnosed with COVID-19 in the last 2 weeks.   . Tell the health care staff about your symptoms: fever, cough and shortness of breath. . After you have been seen by a medical provider, you will be either: o Tested for (COVID-19) and discharged home on quarantine except to seek medical care if symptoms worsen, and asked to  - Stay home and avoid contact with others until you get your results (4-5 days)  - Avoid travel on public transportation if possible (such as bus, train, or airplane) or o Sent to the Emergency Department by EMS for evaluation, COVID-19 testing, and possible  admission depending on your condition and test results.  What to do if you are LOW RISK for COVID-19?  Reduce your risk of any infection by using the same precautions used for avoiding the common cold or flu:  Marland Kitchen Wash your hands often with soap and warm water for at least 20 seconds.  If soap and water are not readily available, use an alcohol-based hand sanitizer with at least 60% alcohol.  . If coughing or sneezing, cover your mouth and nose by coughing or sneezing into the elbow areas of your shirt or coat, into a tissue or into your sleeve (not your hands). . Avoid shaking hands with others and consider head nods or verbal greetings only. . Avoid touching your eyes, nose, or mouth with unwashed hands.  . Avoid close contact with people who are Jennie Bolar. . Avoid places or events with large numbers of people in one location, like concerts or sporting events. . Carefully consider travel plans you have or are making. . If you are planning any travel outside or inside the [place], visit the CDC's Travelers' Health webpage for the latest health notices. . If you have some symptoms but not all symptoms, continue to monitor at home and seek medical attention if your symptoms worsen. . If you are having a medical emergency, call 911.  01/06/19 SCREENING NEG SLS ADDITIONAL HEALTHCARE OPTIONS FOR PATIENTS  [facility] / e-Visit: [email or web address]         [facility]: [phone]  [facility]: [phone]                   [facility]: [phone]  

## 2019-01-07 ENCOUNTER — Ambulatory Visit (INDEPENDENT_AMBULATORY_CARE_PROVIDER_SITE_OTHER): Payer: Medicaid Other | Admitting: Obstetrics and Gynecology

## 2019-01-07 ENCOUNTER — Other Ambulatory Visit: Payer: Self-pay

## 2019-01-07 ENCOUNTER — Encounter: Payer: Self-pay | Admitting: Obstetrics and Gynecology

## 2019-01-07 VITALS — BP 123/74 | HR 80 | Ht 62.0 in | Wt 154.7 lb

## 2019-01-07 DIAGNOSIS — Z6828 Body mass index (BMI) 28.0-28.9, adult: Secondary | ICD-10-CM | POA: Diagnosis not present

## 2019-01-07 DIAGNOSIS — N921 Excessive and frequent menstruation with irregular cycle: Secondary | ICD-10-CM

## 2019-01-07 DIAGNOSIS — F329 Major depressive disorder, single episode, unspecified: Secondary | ICD-10-CM

## 2019-01-07 MED ORDER — NORGESTIMATE-ETH ESTRADIOL 0.25-35 MG-MCG PO TABS: 1.0000 | ORAL_TABLET | Freq: Every day | ORAL | 11 refills | Status: DC

## 2019-01-07 MED ORDER — PHENTERMINE HCL 37.5 MG PO TABS: 37.5000 mg | ORAL_TABLET | Freq: Every day | ORAL | 2 refills | Status: DC

## 2019-01-07 MED ORDER — BUPROPION HCL ER (XL) 150 MG PO TB24: 150.0000 mg | ORAL_TABLET | Freq: Every day | ORAL | 4 refills | Status: DC

## 2019-01-07 NOTE — Patient Instructions (Signed)

## 2019-01-07 NOTE — Progress Notes (Signed)
Subjective:     Patient ID: Christine Arellano, female   DOB: [birth year], 28 y.o.   MRN: [id number]  HPI Here for weight check and depression.has lost 5#s since last visit.  Reports has been emotionally eating. Has separated from spouse due to infidelity. Feels depressed. Has moved in with mom with kids. Family is not supportive and angry over situation as this is not the first time (actually the fourth). She has been in and out of counseling since teenage years, and doesn't feel like it has ever helped, but is willing to try again as long as it is with a female.  Also reports continued and irregular breakthrough bleeding on current pills. Desires to try a different pill.  Desire to continue on weight loss medication.  Depression screen [facility] 2/9 01/07/2019 12/26/2017 09/16/2015  Decreased Interest 3 2 3   Down, Depressed, Hopeless 3 2 3   PHQ - 2 Score 6 4 6   Altered sleeping 3 2 2   Tired, decreased energy 3 2 1   Change in appetite 3 2 3   Feeling bad or failure about yourself  3 2 3   Trouble concentrating 3 2 1   Moving slowly or fidgety/restless 0 0 1  Suicidal thoughts 3 1 1   PHQ-9 Score 24 15 18   Difficult doing work/chores Very difficult Somewhat difficult Somewhat difficult     Review of Systems  Constitutional: Positive for activity change and fatigue.  Genitourinary: Positive for vaginal bleeding.  Psychiatric/Behavioral: Positive for agitation, decreased concentration, dysphoric mood and sleep disturbance. The patient is nervous/anxious.   All other systems reviewed and are negative.      Objective:   Physical Exam A&Ox4 Well groomed female, tearful at times. Blood pressure 123/74, pulse 80, height 5\' 2"  (1.575 m), weight 154 lb 11.2 oz (70.2 kg), last menstrual period 01/06/2019, unknown if currently breastfeeding.  Body mass index is 28.3 kg/m.   Psychiatric Specialty Exam: Physical Exam  Review of Systems  Constitutional: Positive for activity change and fatigue.   Genitourinary: Positive for vaginal bleeding.  Psychiatric/Behavioral: Positive for agitation, decreased concentration, dysphoric mood and sleep disturbance. The patient is nervous/anxious.   All other systems reviewed and are negative.   Blood pressure 123/74, pulse 80, height 5\' 2"  (1.575 m), weight 154 lb 11.2 oz (70.2 kg), last menstrual period 01/06/2019, unknown if currently breastfeeding.Body mass index is 28.3 kg/m.  General Appearance: Disheveled  Eye Contact:  Fair  Speech:  Normal Rate  Volume:  Normal  Mood:  Depressed, Dysphoric, Hopeless and Worthless  Affect:  Depressed, Flat and Tearful  Thought Process:  Disorganized  Orientation:  Full (Time, Place, and Person)  Thought Content:  Illogical, Hallucinations: None, Ideas of Reference:   Delusions, Rumination, Tangential, Abstract Reasoning and Computation  Suicidal Thoughts:  Yes.  without intent/plan  Homicidal Thoughts:  No  Memory:  Immediate;   Good Recent;   Good Remote;   Good  Judgement:  Poor  Insight:  Fair and Lacking  Psychomotor Activity:  Restlessness  Concentration:  Concentration: Fair and Attention Span: Fair  Recall:  Good  Fund of Knowledge:  Good  Language:  Fair  Akathisia:  Negative  Handed:  Left  AIMS (if indicated):     Assets:  Resilience  ADL's:  Intact  Cognition:  WNL  Sleep:         Assessment:     BMI 28 BTB on OCPs Depression     Plan:     Will continue on weight loss medication,  B12 injection given. RTC 4 weeks for weight check. Restarted on wellbutrin 150mg  XL daily, counseled at length about depression, and counseling- referral made to [facility] as patient is willing to try. Counseled to set short term goals and consider journaling. Encouraged to reach out to health dept for housing and bill pay assistance-will consider. Changed OCPs to sprintec and will start when due to start a new pack.   RTC in 4 weeks for weight and med check. >50% of 25 minute visit spent in  counseling on above.   Jennifermarie Franzen,CNM

## 2019-01-14 ENCOUNTER — Telehealth: Payer: Self-pay | Admitting: Obstetrics and Gynecology

## 2019-01-14 NOTE — Telephone Encounter (Signed)
The patient called and stated that she has a strong vaginal odor. The patient is unsure if she needs to come in for an appointment of have something called into her pharmacy. Pt requesting call back. Please advise.

## 2019-01-15 ENCOUNTER — Other Ambulatory Visit: Payer: Self-pay

## 2019-01-15 ENCOUNTER — Other Ambulatory Visit (INDEPENDENT_AMBULATORY_CARE_PROVIDER_SITE_OTHER): Payer: Medicaid Other

## 2019-01-15 DIAGNOSIS — R300 Dysuria: Secondary | ICD-10-CM

## 2019-01-15 LAB — POCT URINALYSIS DIPSTICK
Bilirubin, UA: NEGATIVE
Blood, UA: NEGATIVE
Glucose, UA: NEGATIVE
Ketones, UA: 40
Nitrite, UA: NEGATIVE
Protein, UA: NEGATIVE
Spec Grav, UA: 1.015 (ref 1.010–1.025)
Urobilinogen, UA: 0.2 E.U./dL
pH, UA: 6.5 (ref 5.0–8.0)

## 2019-01-15 MED ORDER — NITROFURANTOIN MONOHYD MACRO 100 MG PO CAPS: 100.0000 mg | ORAL_CAPSULE | Freq: Two times a day (BID) | ORAL | 0 refills | Status: DC

## 2019-01-15 NOTE — Telephone Encounter (Signed)
Called pt she is coming in for urine drop off

## 2019-01-17 LAB — URINE CULTURE

## 2019-01-27 DIAGNOSIS — L700 Acne vulgaris: Secondary | ICD-10-CM | POA: Diagnosis not present

## 2019-01-27 DIAGNOSIS — L218 Other seborrheic dermatitis: Secondary | ICD-10-CM | POA: Diagnosis not present

## 2019-01-27 DIAGNOSIS — L853 Xerosis cutis: Secondary | ICD-10-CM | POA: Diagnosis not present

## 2019-01-27 DIAGNOSIS — Z79899 Other long term (current) drug therapy: Secondary | ICD-10-CM | POA: Diagnosis not present

## 2019-01-29 ENCOUNTER — Telehealth: Payer: Self-pay | Admitting: Obstetrics and Gynecology

## 2019-01-29 ENCOUNTER — Other Ambulatory Visit: Payer: Self-pay | Admitting: *Deleted

## 2019-01-29 MED ORDER — IBUPROFEN 800 MG PO TABS: 800.0000 mg | ORAL_TABLET | Freq: Three times a day (TID) | ORAL | 2 refills | Status: DC | PRN

## 2019-01-29 NOTE — Telephone Encounter (Signed)
Done-ac 

## 2019-01-29 NOTE — Telephone Encounter (Signed)
Patient called requesting a refill on [facility] [place]. She also needs a referral entered for hair loss sent to [facility] dermatology. She has already been seen there for another issue and they stated they would see her for the hair loss but need another referral sent.Thanks

## 2019-02-05 ENCOUNTER — Encounter: Payer: Medicaid Other | Admitting: Obstetrics and Gynecology

## 2019-02-10 ENCOUNTER — Telehealth: Payer: Self-pay | Admitting: Obstetrics and Gynecology

## 2019-02-10 NOTE — Telephone Encounter (Signed)
Pt called to check the status of her dermatology referral for hair loss. Pt is requesting a call back. Please advise.

## 2019-02-12 ENCOUNTER — Encounter: Payer: Self-pay | Admitting: *Deleted

## 2019-02-12 NOTE — Telephone Encounter (Signed)
Her medicaid will require it to come from her PCP which is listed as Carolin Coy

## 2019-02-26 DIAGNOSIS — K130 Diseases of lips: Secondary | ICD-10-CM | POA: Diagnosis not present

## 2019-02-26 DIAGNOSIS — L700 Acne vulgaris: Secondary | ICD-10-CM | POA: Diagnosis not present

## 2019-02-26 DIAGNOSIS — Z79899 Other long term (current) drug therapy: Secondary | ICD-10-CM | POA: Diagnosis not present

## 2019-02-28 ENCOUNTER — Other Ambulatory Visit: Payer: Self-pay | Admitting: *Deleted

## 2019-02-28 MED ORDER — IBUPROFEN 800 MG PO TABS: 800.0000 mg | ORAL_TABLET | Freq: Three times a day (TID) | ORAL | 2 refills | Status: DC | PRN

## 2019-03-04 ENCOUNTER — Ambulatory Visit: Payer: Medicaid Other | Admitting: Internal Medicine

## 2019-03-04 ENCOUNTER — Other Ambulatory Visit: Payer: Self-pay

## 2019-03-04 ENCOUNTER — Encounter: Payer: Self-pay | Admitting: Internal Medicine

## 2019-03-04 VITALS — BP 132/70 | HR 103 | Ht 62.0 in | Wt 157.0 lb

## 2019-03-04 DIAGNOSIS — F411 Generalized anxiety disorder: Secondary | ICD-10-CM | POA: Diagnosis not present

## 2019-03-04 DIAGNOSIS — Z23 Encounter for immunization: Secondary | ICD-10-CM | POA: Diagnosis not present

## 2019-03-04 DIAGNOSIS — L659 Nonscarring hair loss, unspecified: Secondary | ICD-10-CM | POA: Insufficient documentation

## 2019-03-04 MED ORDER — ALPRAZOLAM 0.25 MG PO TABS: 0.1250 mg | ORAL_TABLET | Freq: Two times a day (BID) | ORAL | 0 refills | Status: DC | PRN

## 2019-03-04 NOTE — Progress Notes (Signed)
Date:  03/04/2019   Name:  Christine Arellano   DOB:  12/28/1990   MRN:  [id number]   Chief Complaint: Alopecia (New referral for Dr Dierdre Searles. Flu shot.)  Anxiety Presents for follow-up (she has has sx for years, slightly worse since her recent terminated pregnancy.  She has tried numberous medications - did not do well on lexapro, bupropion, buspar) visit. Symptoms include insomnia, irritability, nervous/anxious behavior and palpitations. Patient reports no chest pain, dizziness or shortness of breath. The severity of symptoms is mild. The quality of sleep is fair. Nighttime awakenings: one to two.     Hair loss - she has always had thinning hair after her pregnancies.  This seems to be slightly more than usual but no patches of total loss of hair.  She did have to terminate a pregnancy earlier this year.  She is being treated for acne as well with Accutane.  Taking OCPs and using condoms.   Review of Systems  Constitutional: Positive for irritability. Negative for chills, fatigue and fever.  Respiratory: Negative for cough, chest tightness, shortness of breath and wheezing.   Cardiovascular: Positive for palpitations. Negative for chest pain and leg swelling.  Gastrointestinal: Negative for abdominal pain, blood in stool, constipation and diarrhea.  Genitourinary: Positive for menstrual problem (still having pre-menstrual spotting every month despite changing OCPs).  Musculoskeletal: Positive for myalgias (restless leg type symptoms in the evening).  Skin:       Hair loss over entire scalp - no patchy loss  Neurological: Negative for dizziness, light-headedness and headaches.  Psychiatric/Behavioral: Negative for dysphoric mood. The patient is nervous/anxious and has insomnia.     Patient Active Problem List   Diagnosis Date Noted  . normal karyotype after Abnormal chromosomal and genetic finding on antenatal screening mother   . GBS bacteriuria 05/22/2018  . Dry eye syndrome  of both eyes 10/18/2017  . S/P cesarean section 02/24/2015    No Known Allergies  Past Surgical History:  Procedure Laterality Date  . arthroscopic knee Right 2008  . CESAREAN SECTION  2014  . CESAREAN SECTION N/A 02/24/2015   Procedure: CESAREAN SECTION;  Surgeon: Rubie Maid, MD;  Location: [facility] ORS;  Service: Obstetrics;  Laterality: N/A;  . CESAREAN SECTION N/A 05/09/2017   Procedure: REPEAT CESAREAN SECTION;  Surgeon: Rubie Maid, MD;  Location: [facility] ORS;  Service: Obstetrics;  Laterality: N/A;  . HEMORROIDECTOMY    . THERAPEUTIC ABORTION  08/2018   fetal CNS abnormalities  . TONSILLECTOMY    . WISDOM TOOTH EXTRACTION      Social History   Tobacco Use  . Smoking status: Former Smoker    Packs/day: 1.00    Types: Cigarettes    Quit date: 08/27/2009    Years since quitting: 9.5  . Smokeless tobacco: Never Used  Substance Use Topics  . Alcohol use: Not Currently    Alcohol/week: 0.0 standard drinks  . Drug use: No    Medication list has been reviewed and updated.  Current Meds  Medication Sig  . ABSORICA LD 32 MG CAPS Take 3 capsules by mouth daily. Dr Phillip Heal.  . cyanocobalamin (,VITAMIN B-12,) 1000 MCG/ML injection Inject 1 mL (1,000 mcg total) into the muscle every 30 (thirty) days.  Marland Kitchen ibuprofen (ADVIL) 800 MG tablet Take 1 tablet (800 mg total) by mouth every 8 (eight) hours as needed.  Marland Kitchen ketoconazole (NIZORAL) 2 % shampoo Dr Phillip Heal.  . norgestimate-ethinyl estradiol (ORTHO-CYCLEN) 0.25-35 MG-MCG tablet Take 1 tablet by mouth daily.  Marland Kitchen  phentermine (ADIPEX-P) 37.5 MG tablet Take 1 tablet (37.5 mg total) by mouth daily before breakfast.  . triamcinolone cream (KENALOG) 0.1 % Apply 1 application topically 2 (two) times daily.    PHQ 2/9 Scores 03/04/2019 01/07/2019 12/26/2017 09/16/2015  PHQ - 2 Score 0 6 4 6   PHQ- 9 Score - 24 15 18     BP Readings from Last 3 Encounters:  03/04/19 132/70  01/07/19 123/74  12/04/18 109/69    Physical Exam Vitals signs and  nursing note reviewed.  Constitutional:      General: She is not in acute distress.    Appearance: Normal appearance. She is well-developed.  HENT:     Head: Normocephalic and atraumatic.  Neck:     Musculoskeletal: Normal range of motion and neck supple.  Cardiovascular:     Rate and Rhythm: Normal rate and regular rhythm.     Pulses: Normal pulses.  Pulmonary:     Effort: Pulmonary effort is normal. No respiratory distress.     Breath sounds: No wheezing or rhonchi.  Musculoskeletal: Normal range of motion.     Right lower leg: No edema.     Left lower leg: No edema.  Lymphadenopathy:     Cervical: No cervical adenopathy.  Skin:    General: Skin is warm and dry.     Capillary Refill: Capillary refill takes less than 2 seconds.     Findings: No rash.     Comments: Mild thinning of hair across the entire scalp - worse in the frontal region  Neurological:     General: No focal deficit present.     Mental Status: She is alert and oriented to person, place, and time.  Psychiatric:        Attention and Perception: Attention normal.        Mood and Affect: Mood is anxious.        Behavior: Behavior normal.        Thought Content: Thought content normal.     Wt Readings from Last 3 Encounters:  03/04/19 157 lb (71.2 kg)  01/07/19 154 lb 11.2 oz (70.2 kg)  12/04/18 159 lb 3.2 oz (72.2 kg)    BP 132/70   Pulse (!) 103   Ht 5\' 2"  (1.575 m)   Wt 157 lb (71.2 kg)   LMP 02/04/2019 (Exact Date)   SpO2 100%   Breastfeeding No   BMI 28.72 kg/m   Assessment and Plan: 1. Alopecia of scalp - Ambulatory referral to Dermatology  2. Generalized anxiety disorder Will try very low dose Xanax 0.125 mg twice a day only as needed - ALPRAZolam (XANAX) 0.25 MG tablet; Take 0.5 tablets (0.125 mg total) by mouth 2 (two) times daily as needed for anxiety.  Dispense: 20 tablet; Refill: 0  3. Need for immunization against influenza - Flu Vaccine QUAD 36+ mos IM   Partially dictated  using Editor, commissioning. Any errors are unintentional.  [name], MD [facility]  03/04/2019

## 2019-03-27 DIAGNOSIS — Z79899 Other long term (current) drug therapy: Secondary | ICD-10-CM | POA: Diagnosis not present

## 2019-03-27 DIAGNOSIS — L700 Acne vulgaris: Secondary | ICD-10-CM | POA: Diagnosis not present

## 2019-04-15 ENCOUNTER — Encounter: Payer: Self-pay | Admitting: Internal Medicine

## 2019-04-15 ENCOUNTER — Other Ambulatory Visit: Payer: Self-pay

## 2019-04-15 ENCOUNTER — Ambulatory Visit: Payer: Medicaid Other | Admitting: Internal Medicine

## 2019-04-15 VITALS — BP 112/64 | HR 102 | Ht 62.0 in | Wt 153.0 lb

## 2019-04-15 DIAGNOSIS — F5101 Primary insomnia: Secondary | ICD-10-CM | POA: Diagnosis not present

## 2019-04-15 DIAGNOSIS — H539 Unspecified visual disturbance: Secondary | ICD-10-CM | POA: Diagnosis not present

## 2019-04-15 DIAGNOSIS — H5789 Other specified disorders of eye and adnexa: Secondary | ICD-10-CM | POA: Diagnosis not present

## 2019-04-15 DIAGNOSIS — F411 Generalized anxiety disorder: Secondary | ICD-10-CM | POA: Diagnosis not present

## 2019-04-15 MED ORDER — TRAZODONE HCL 50 MG PO TABS: 25.0000 mg | ORAL_TABLET | Freq: Every evening | ORAL | 5 refills | Status: DC | PRN

## 2019-04-15 MED ORDER — NEOMYCIN-POLYMYXIN-DEXAMETH 3.5-10000-0.1 OP SUSP: 2.0000 [drp] | Freq: Four times a day (QID) | OPHTHALMIC | 0 refills | Status: AC

## 2019-04-15 NOTE — Progress Notes (Signed)
Date:  04/15/2019   Name:  Christine Arellano   DOB:  March 25, 1991   MRN:  [id number]   Chief Complaint: Blurred Vision (Redness and blurred vision in Rt eye. Stopped accutaine a week ago to see if its the cause of blurred vision. Had pink eye and used olf Rx to get rid of it. Right Eye. )  Eye Problem  The right eye is affected. This is a new problem. The current episode started 1 to 4 weeks ago. The problem has been gradually improving (used old eye drops for presumed conjuncitivitis). There was no injury mechanism. Associated symptoms include eye redness. Pertinent negatives include no blurred vision, eye discharge, fever, foreign body sensation, itching or photophobia.  Insomnia Primary symptoms: sleep disturbance, difficulty falling asleep, frequent awakening.  The problem occurs every several days. The problem is unchanged.     Lab Results  Component Value Date   CREATININE 0.47 (L) 05/31/2018   BUN 8 05/31/2018   NA 139 05/31/2018   K 4.2 05/31/2018   CL 103 05/31/2018   CO2 19 (L) 05/31/2018   No results found for: CHOL, HDL, LDLCALC, LDLDIRECT, TRIG, CHOLHDL No results found for: TSH No results found for: HGBA1C   Review of Systems  Constitutional: Negative for fever.  HENT: Negative for congestion, postnasal drip, sinus pressure and sinus pain.   Eyes: Positive for redness. Negative for blurred vision, photophobia, discharge and itching.  Respiratory: Negative for shortness of breath and wheezing.   Cardiovascular: Negative for chest pain.  Neurological: Negative for dizziness, light-headedness and headaches.  Hematological: Negative for adenopathy.  Psychiatric/Behavioral: Positive for sleep disturbance. Negative for dysphoric mood. The patient is nervous/anxious and has insomnia.     Patient Active Problem List   Diagnosis Date Noted  . Generalized anxiety disorder 03/04/2019  . Alopecia of scalp 03/04/2019  . normal karyotype after Abnormal chromosomal and  genetic finding on antenatal screening mother   . GBS bacteriuria 05/22/2018  . Dry eye syndrome of both eyes 10/18/2017  . Adult acne 05/03/2015  . S/P cesarean section 02/24/2015    No Known Allergies  Past Surgical History:  Procedure Laterality Date  . arthroscopic knee Right 2008  . CESAREAN SECTION  2014  . CESAREAN SECTION N/A 02/24/2015   Procedure: CESAREAN SECTION;  Surgeon: Rubie Maid, MD;  Location: [facility] ORS;  Service: Obstetrics;  Laterality: N/A;  . CESAREAN SECTION N/A 05/09/2017   Procedure: REPEAT CESAREAN SECTION;  Surgeon: Rubie Maid, MD;  Location: [facility] ORS;  Service: Obstetrics;  Laterality: N/A;  . HEMORROIDECTOMY    . THERAPEUTIC ABORTION  08/2018   fetal CNS abnormalities  . TONSILLECTOMY    . WISDOM TOOTH EXTRACTION      Social History   Tobacco Use  . Smoking status: Former Smoker    Packs/day: 1.00    Types: Cigarettes    Quit date: 08/27/2009    Years since quitting: 9.6  . Smokeless tobacco: Never Used  Substance Use Topics  . Alcohol use: Not Currently    Alcohol/week: 0.0 standard drinks  . Drug use: No     Medication list has been reviewed and updated.  Current Meds  Medication Sig  . ALPRAZolam (XANAX) 0.25 MG tablet Take 0.5 tablets (0.125 mg total) by mouth 2 (two) times daily as needed for anxiety.  . cyanocobalamin (,VITAMIN B-12,) 1000 MCG/ML injection Inject 1 mL (1,000 mcg total) into the muscle every 30 (thirty) days.  Marland Kitchen ibuprofen (ADVIL) 800 MG tablet  Take 1 tablet (800 mg total) by mouth every 8 (eight) hours as needed.  Marland Kitchen ketoconazole (NIZORAL) 2 % shampoo Dr Phillip Heal.  . norgestimate-ethinyl estradiol (ORTHO-CYCLEN) 0.25-35 MG-MCG tablet Take 1 tablet by mouth daily.  . phentermine (ADIPEX-P) 37.5 MG tablet Take 1 tablet (37.5 mg total) by mouth daily before breakfast.  . triamcinolone cream (KENALOG) 0.1 % Apply 1 application topically 2 (two) times daily.    PHQ 2/9 Scores 03/04/2019 01/07/2019 12/26/2017 09/16/2015  PHQ  - 2 Score 0 6 4 6   PHQ- 9 Score - 24 15 18     BP Readings from Last 3 Encounters:  04/15/19 112/64  03/04/19 132/70  01/07/19 123/74    Physical Exam Vitals signs and nursing note reviewed.  Constitutional:      General: She is not in acute distress.    Appearance: She is well-developed.  HENT:     Head: Normocephalic and atraumatic.  Eyes:     General: Lids are normal.     Extraocular Movements: Extraocular movements intact.     Conjunctiva/sclera:     Right eye: Right conjunctiva is injected.     Left eye: Left conjunctiva is injected.  Cardiovascular:     Rate and Rhythm: Normal rate and regular rhythm.  Pulmonary:     Effort: Pulmonary effort is normal. No respiratory distress.     Breath sounds: Normal breath sounds and air entry.  Musculoskeletal: Normal range of motion.  Skin:    General: Skin is warm and dry.     Findings: No rash.  Neurological:     Mental Status: She is alert and oriented to person, place, and time.  Psychiatric:        Attention and Perception: Attention normal.        Mood and Affect: Mood normal.        Behavior: Behavior normal.        Thought Content: Thought content normal.     Wt Readings from Last 3 Encounters:  04/15/19 153 lb (69.4 kg)  03/04/19 157 lb (71.2 kg)  01/07/19 154 lb 11.2 oz (70.2 kg)    BP 112/64   Pulse (!) 102   Ht 5\' 2"  (1.575 m)   Wt 153 lb (69.4 kg)   SpO2 100%   BMI 27.98 kg/m   Assessment and Plan: 1. Red eye Will treat with another drop but also refer Doubt this is due to Accutane but recommend remaining off until eye appt. - Ambulatory referral to Ophthalmology - neomycin-polymyxin b-dexamethasone (MAXITROL) 3.5-10000-0.1 SUSP; Place 2 drops into both eyes every 6 (six) hours for 5 days.  Dispense: 5 mL; Refill: 0  2. Vision changes - Ambulatory referral to Ophthalmology  3. Generalized anxiety disorder May continue to use Xanax as needed Call for refill when needed  4. Primary insomnia  Will try trazodone - to help with anxiety and sleep - traZODone (DESYREL) 50 MG tablet; Take 0.5-1 tablets (25-50 mg total) by mouth at bedtime as needed for sleep.  Dispense: 30 tablet; Refill: 5   Partially dictated using Editor, commissioning. Any errors are unintentional.  [name], MD [facility]  04/15/2019

## 2019-04-28 DIAGNOSIS — Z79899 Other long term (current) drug therapy: Secondary | ICD-10-CM | POA: Diagnosis not present

## 2019-04-28 DIAGNOSIS — L700 Acne vulgaris: Secondary | ICD-10-CM | POA: Diagnosis not present

## 2019-05-05 ENCOUNTER — Other Ambulatory Visit: Payer: Self-pay | Admitting: Internal Medicine

## 2019-05-05 ENCOUNTER — Ambulatory Visit: Payer: Medicaid Other | Admitting: Internal Medicine

## 2019-05-05 ENCOUNTER — Encounter: Payer: Self-pay | Admitting: Internal Medicine

## 2019-05-05 ENCOUNTER — Other Ambulatory Visit: Payer: Self-pay

## 2019-05-05 VITALS — BP 118/78 | HR 85 | Ht 62.0 in | Wt 156.0 lb

## 2019-05-05 DIAGNOSIS — F411 Generalized anxiety disorder: Secondary | ICD-10-CM

## 2019-05-05 DIAGNOSIS — N6459 Other signs and symptoms in breast: Secondary | ICD-10-CM

## 2019-05-05 DIAGNOSIS — M546 Pain in thoracic spine: Secondary | ICD-10-CM | POA: Diagnosis not present

## 2019-05-05 DIAGNOSIS — Z6828 Body mass index (BMI) 28.0-28.9, adult: Secondary | ICD-10-CM

## 2019-05-05 DIAGNOSIS — H5789 Other specified disorders of eye and adnexa: Secondary | ICD-10-CM | POA: Insufficient documentation

## 2019-05-05 DIAGNOSIS — F331 Major depressive disorder, recurrent, moderate: Secondary | ICD-10-CM | POA: Diagnosis not present

## 2019-05-05 HISTORY — DX: Pain in thoracic spine: M54.6

## 2019-05-05 MED ORDER — VORTIOXETINE HBR 5 MG PO TABS: 5.0000 mg | ORAL_TABLET | Freq: Every day | ORAL | 1 refills | Status: DC

## 2019-05-05 MED ORDER — CYCLOBENZAPRINE HCL 10 MG PO TABS: 10.0000 mg | ORAL_TABLET | Freq: Every day | ORAL | 1 refills | Status: DC

## 2019-05-05 NOTE — Patient Instructions (Addendum)
[place]  [phone]. Referral was faxed on 04/15/2019- Call to schedule an appt for vision changes.  Call [facility] in [place]

## 2019-05-05 NOTE — Progress Notes (Signed)
Date:  05/05/2019   Name:  Christine Arellano   DOB:  [birth year]   MRN:  [id number]   Chief Complaint: Back Pain (Upper back at bra strap line. Sitting to the side because if she sits up straight she gets sharp shooting pains up and down. Slouching helps. The pain is constant but when back is straight its much worse. X 1.5 months) and Depression (PHQ9- 21, GAD7- 15. No sex drive. )  Back Pain This is a new problem. The current episode started more than 1 month ago. The problem occurs daily. The problem is unchanged. The pain is present in the thoracic spine. The quality of the pain is described as shooting and cramping. The pain does not radiate. The pain is moderate. Pertinent negatives include no bladder incontinence, bowel incontinence, chest pain, fever or headaches. She has tried NSAIDs for the symptoms. The treatment provided mild relief.  Depression      The patient presents with depression.  This is a recurrent problem.  The onset quality is gradual.   The problem occurs daily.  The problem has been gradually worsening since onset.  Associated symptoms include hopelessness, decreased interest and myalgias.  Associated symptoms include no fatigue, no body aches, no headaches and no suicidal ideas.( Also weight gain, decreased libido)     The symptoms are aggravated by family issues.  Treatments tried: on nothing currently - but has been on numerous medications since the age of 5.  Past medical history includes depression.    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 Negative for chills, fatigue and  fever.  HENT: Negative for trouble swallowing.   Eyes: Positive for pain and redness.  Respiratory: Negative for cough, chest tightness, shortness of breath and wheezing.   Cardiovascular: Negative for chest pain and palpitations.  Gastrointestinal: Negative for bowel incontinence.  Genitourinary: Negative for bladder incontinence and dyspareunia (but loss of libido).       Nipples are inverted - very painful to touch with crusting.  Hard to clean and have an odor.  Difficult to get them to point outward  Musculoskeletal: Positive for back pain and myalgias.  Skin: Negative for color change and rash.  Neurological: Negative for dizziness, light-headedness and headaches.  Hematological: Negative for adenopathy.  Psychiatric/Behavioral: Positive for depression and dysphoric mood. Negative for sleep disturbance and suicidal ideas. The patient is nervous/anxious.     Patient Active Problem List   Diagnosis Date Noted  . 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  . Generalized anxiety disorder 03/04/2019  . Alopecia of scalp 03/04/2019  . normal karyotype after Abnormal chromosomal and genetic finding on antenatal screening mother   . GBS bacteriuria 05/22/2018  . Dry eye syndrome of both eyes 10/18/2017  . Adult acne 05/03/2015  . S/P cesarean section 02/24/2015    No Known Allergies  Past Surgical History:  Procedure Laterality Date  . arthroscopic knee Right 2008  . CESAREAN SECTION  2014  . CESAREAN SECTION N/A 02/24/2015   Procedure: CESAREAN SECTION;  Surgeon: Rubie Maid, MD;  [facility]  ORS;  Service: Obstetrics;  Laterality: N/A;  . CESAREAN SECTION N/A 05/09/2017   Procedure: REPEAT CESAREAN SECTION;  Surgeon: Rubie Maid, MD;  Location: [facility] ORS;  Service: Obstetrics;  Laterality: N/A;  . HEMORROIDECTOMY    . THERAPEUTIC ABORTION  08/2018   fetal CNS abnormalities  . TONSILLECTOMY    . WISDOM TOOTH EXTRACTION      Social History   Tobacco Use  . Smoking status: Former  Smoker    Packs/day: 1.00    Types: Cigarettes    Quit date: 08/27/2009    Years since quitting: 9.6  . Smokeless tobacco: Never Used  Substance Use Topics  . Alcohol use: Not Currently    Alcohol/week: 0.0 standard drinks  . Drug use: No     Medication list has been reviewed and updated.  Current Meds  Medication Sig  . ABSORICA LD 32 MG CAPS Take 2 capsules by mouth daily. Dr Phillip Heal. Pleas Patricia  . ALPRAZolam (XANAX) 0.25 MG tablet Take 0.5 tablets (0.125 mg total) by mouth 2 (two) times daily as needed for anxiety.  . cyanocobalamin (,VITAMIN B-12,) 1000 MCG/ML injection Inject 1 mL (1,000 mcg total) into the muscle every 30 (thirty) days.  Marland Kitchen ibuprofen (ADVIL) 800 MG tablet Take 1 tablet (800 mg total) by mouth every 8 (eight) hours as needed.  Marland Kitchen ketoconazole (NIZORAL) 2 % shampoo Dr Phillip Heal.  . norgestimate-ethinyl estradiol (ORTHO-CYCLEN) 0.25-35 MG-MCG tablet Take 1 tablet by mouth daily.  . traZODone (DESYREL) 50 MG tablet Take 0.5-1 tablets (25-50 mg total) by mouth at bedtime as needed for sleep. (Patient taking differently: Take 50 mg by mouth at bedtime as needed for sleep. )  . triamcinolone cream (KENALOG) 0.1 % Apply 1 application topically 2 (two) times daily.    PHQ 2/9 Scores 05/05/2019 04/15/2019 03/04/2019 01/07/2019  PHQ - 2 Score 6 2 0 6  PHQ- 9 Score 21 8 - 24   GAD 7 : Generalized Anxiety Score 05/05/2019  Nervous, Anxious, on Edge 2  Control/stop worrying 3  Worry too much - different things 3  Trouble relaxing 1  Restless 2  Easily annoyed or irritable 3  Afraid - awful might happen 1  Total GAD 7 Score 15  Anxiety Difficulty Very difficult      BP Readings from Last 3 Encounters:  05/05/19 118/78  04/15/19 112/64  03/04/19 132/70    Physical Exam Vitals signs and nursing note reviewed.  Constitutional:      General: She is not in acute distress.    Appearance: Normal appearance. She is well-developed.  HENT:     Head: Normocephalic and  atraumatic.  Neck:     Musculoskeletal: Normal range of motion.  Cardiovascular:     Rate and Rhythm: Normal rate and regular rhythm.     Pulses: Normal pulses.     Heart sounds: No murmur.  Pulmonary:     Effort: Pulmonary effort is normal. No respiratory distress.     Breath sounds: No wheezing or rhonchi.  Chest:     Breasts:        Right: Inverted nipple present.        Left: Inverted nipple present.     Comments: Nipples crusted, no bleeding or drainage Tender to touch - unable to get nipples to evert Musculoskeletal: Normal range of motion.     Thoracic back: She exhibits tenderness and spasm.  Lymphadenopathy:     Cervical: No cervical adenopathy.  Skin:    General: Skin is warm  and dry.     Findings: No rash.  Neurological:     Mental Status: She is alert and oriented to person, place, and time.  Psychiatric:        Attention and Perception: Attention normal.        Mood and Affect: Mood normal.        Speech: Speech normal.        Behavior: Behavior normal.        Thought Content: Thought content normal. Thought content does not include suicidal ideation. Thought content does not include suicidal plan.        Cognition and Memory: Cognition normal.     Wt Readings from Last 3 Encounters:  05/05/19 156 lb (70.8 kg)  04/15/19 153 lb (69.4 kg)  03/04/19 157 lb (71.2 kg)    BP 118/78   Pulse 85   Ht 5\' 2"  (1.575 m)   Wt 156 lb (70.8 kg)   SpO2 100%   Breastfeeding No   BMI 28.53 kg/m   Assessment and Plan: 1. Moderate episode of recurrent major depressive disorder (HCC) Depression is worsening over the past few months - multiple symptoms and a long history of depression treated off and on since age 65.  She has been on multiple medications and stopped them due to ineffectiveness. Currently stable without SI/HI Recommend establishing care with Psychiatry - CBC Will start Trintellix - vortioxetine HBr (TRINTELLIX) 5 MG TABS tablet; Take 1 tablet (5 mg total)  by mouth daily.  Dispense: 30 tablet; Refill: 1  2. Acute midline thoracic back pain Likely due to muscle spasm- continue Advil and heat Add flexeril at bedtime - cyclobenzaprine (FLEXERIL) 10 MG tablet; Take 1 tablet (10 mg total) by mouth at bedtime.  Dispense: 30 tablet; Refill: 1  3. Red eye Referral to [facility] was done but pt was never contacted Will ask her to call them to follow up and make an appointment  4. BMI 28.0-28.9,adult Pt has been on Phentermine several times over the years and had good success Currently not motivated to exercise or follow a healthy diet so recommend holding PHentermine for now until depression medication can be adjusted  5. Inverted nipple Recommend applying neosporin daily with a Q-tip and avoid irritating the area If there is continued inflammation, will need to refer to Dermatology   Partially dictated using Dragon software. Any errors are unintentional.  [name], MD [facility]  05/05/2019

## 2019-05-09 DIAGNOSIS — H04123 Dry eye syndrome of bilateral lacrimal glands: Secondary | ICD-10-CM | POA: Diagnosis not present

## 2019-05-28 DIAGNOSIS — Z79899 Other long term (current) drug therapy: Secondary | ICD-10-CM | POA: Diagnosis not present

## 2019-05-28 DIAGNOSIS — L700 Acne vulgaris: Secondary | ICD-10-CM | POA: Diagnosis not present

## 2019-06-28 ENCOUNTER — Other Ambulatory Visit: Payer: Self-pay | Admitting: Internal Medicine

## 2019-06-28 DIAGNOSIS — F411 Generalized anxiety disorder: Secondary | ICD-10-CM

## 2019-06-28 DIAGNOSIS — F331 Major depressive disorder, recurrent, moderate: Secondary | ICD-10-CM

## 2019-06-30 ENCOUNTER — Other Ambulatory Visit: Payer: Self-pay | Admitting: Internal Medicine

## 2019-06-30 DIAGNOSIS — Z79899 Other long term (current) drug therapy: Secondary | ICD-10-CM | POA: Diagnosis not present

## 2019-06-30 DIAGNOSIS — L700 Acne vulgaris: Secondary | ICD-10-CM | POA: Diagnosis not present

## 2019-06-30 DIAGNOSIS — F411 Generalized anxiety disorder: Secondary | ICD-10-CM

## 2019-07-09 ENCOUNTER — Ambulatory Visit (INDEPENDENT_AMBULATORY_CARE_PROVIDER_SITE_OTHER): Payer: Medicaid Other | Admitting: Internal Medicine

## 2019-07-09 ENCOUNTER — Other Ambulatory Visit: Payer: Self-pay

## 2019-07-09 ENCOUNTER — Encounter: Payer: Self-pay | Admitting: Internal Medicine

## 2019-07-09 VITALS — BP 128/82 | HR 81 | Temp 98.1°F | Ht 62.0 in | Wt 167.0 lb

## 2019-07-09 DIAGNOSIS — F331 Major depressive disorder, recurrent, moderate: Secondary | ICD-10-CM

## 2019-07-09 DIAGNOSIS — D485 Neoplasm of uncertain behavior of skin: Secondary | ICD-10-CM | POA: Diagnosis not present

## 2019-07-09 DIAGNOSIS — Z6830 Body mass index (BMI) 30.0-30.9, adult: Secondary | ICD-10-CM

## 2019-07-09 DIAGNOSIS — L700 Acne vulgaris: Secondary | ICD-10-CM | POA: Diagnosis not present

## 2019-07-09 MED ORDER — PHENTERMINE HCL 37.5 MG PO TABS: 37.5000 mg | ORAL_TABLET | Freq: Every day | ORAL | 1 refills | Status: DC

## 2019-07-09 MED ORDER — VORTIOXETINE HBR 10 MG PO TABS: 10.0000 mg | ORAL_TABLET | Freq: Every day | ORAL | 1 refills | Status: DC

## 2019-07-09 NOTE — Progress Notes (Signed)
Date:  07/09/2019   Name:  Christine Arellano   DOB:  18-Jun-1990   MRN:  [id number]   Chief Complaint: Weight Loss (Follow up for weight loss. Gained weight and wants to discuss getting started back on. ), Depression (Follow up. PHQ9- 11), and Verrucous Vulgaris (Middle of stomach spot been there for a long time. Gets inflammed- scabs and bleeds. Then heals and repeats the process. )  Depression        This is a chronic problem.  The problem occurs daily.  The problem has been gradually improving since onset.  Associated symptoms include no fatigue, no headaches and no suicidal ideas.( She feels that she has improved - she is less tearful and less irritable with her children. )  Past treatments include SSRIs - Selective serotonin reuptake inhibitors (trintellix).  Compliance with treatment is good.  Previous treatment provided mild relief. Weight gain - She has gained about 11 lbs since her last visit.  She has used phentermine in the past and done well. She would like to try it again. She is also on Trintellix for depression and a higher dose may help her eat less. Skin lesion - lesion on abdomen has been there for some time.  It gets inflamed and she wants to have it removed.  Lab Results  Component Value Date   CREATININE 0.47 (L) 05/31/2018   BUN 8 05/31/2018   NA 139 05/31/2018   K 4.2 05/31/2018   CL 103 05/31/2018   CO2 19 (L) 05/31/2018   No results found for: CHOL, HDL, LDLCALC, LDLDIRECT, TRIG, CHOLHDL No results found for: TSH No results found for: HGBA1C   Review of Systems  Constitutional: Positive for unexpected weight change. Negative for chills, fatigue and fever.  Respiratory: Negative for cough, chest tightness and shortness of breath.   Cardiovascular: Negative for chest pain, palpitations and leg swelling.  Skin: Positive for rash.  Neurological: Negative for dizziness, light-headedness and headaches.  Psychiatric/Behavioral: Positive for depression and dysphoric  mood. Negative for sleep disturbance and suicidal ideas. The patient is nervous/anxious.     Patient Active Problem List   Diagnosis Date Noted  . Inverted nipple 05/05/2019  . Red eye 05/05/2019  . Moderate episode of recurrent major depressive disorder ([place]) 05/05/2019  . Acute midline thoracic back pain 05/05/2019  . BMI 28.0-28.9,adult 05/05/2019  . 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  . Generalized anxiety disorder 03/04/2019  . Alopecia of scalp 03/04/2019  . normal karyotype after Abnormal chromosomal and genetic finding on antenatal screening mother   . GBS bacteriuria 05/22/2018  . Dry eye syndrome of both eyes 10/18/2017  . Adult acne 05/03/2015  . S/P cesarean section 02/24/2015    No Known Allergies  Past Surgical History:  Procedure Laterality Date  . arthroscopic knee Right 2008  . CESAREAN SECTION  2014  . CESAREAN SECTION N/A 02/24/2015   Procedure: CESAREAN SECTION;  Surgeon: Rubie Maid, MD;  Location: [facility] ORS;  Service: Obstetrics;  Laterality: N/A;  . CESAREAN SECTION N/A 05/09/2017   Procedure: REPEAT CESAREAN SECTION;  Surgeon: Rubie Maid, MD;  Location: [facility] ORS;  Service: Obstetrics;  Laterality: N/A;  . HEMORROIDECTOMY    . THERAPEUTIC ABORTION  08/2018   fetal CNS abnormalities  . TONSILLECTOMY    . WISDOM TOOTH EXTRACTION      Social History   Tobacco Use  . Smoking status: Former Smoker    Packs/day: 1.00    Types: Cigarettes    Quit date:  08/27/2009    Years since quitting: 9.8  . Smokeless tobacco: Never Used  Substance Use Topics  . Alcohol use: Not Currently    Alcohol/week: 0.0 standard drinks  . Drug use: No     Medication list has been reviewed and updated.  Current Meds  Medication Sig  . ABSORICA LD 32 MG CAPS Take 2 capsules by mouth daily. Dr Phillip Heal. Pleas Patricia  . cyclobenzaprine (FLEXERIL) 10 MG tablet Take 1 tablet (10 mg total) by mouth at bedtime.  Marland Kitchen ibuprofen (ADVIL) 800 MG tablet Take 1 tablet (800 mg total) by mouth  every 8 (eight) hours as needed.  Marland Kitchen ketoconazole (NIZORAL) 2 % shampoo Dr Phillip Heal.  . norgestimate-ethinyl estradiol (ORTHO-CYCLEN) 0.25-35 MG-MCG tablet Take 1 tablet by mouth daily.  . traZODone (DESYREL) 50 MG tablet Take 0.5-1 tablets (25-50 mg total) by mouth at bedtime as needed for sleep. (Patient taking differently: Take 50 mg by mouth at bedtime as needed for sleep. )  . triamcinolone cream (KENALOG) 0.1 % Apply 1 application topically 2 (two) times daily.  . TRINTELLIX 5 MG TABS tablet TAKE 1 TABLET BY MOUTH EVERY DAY    PHQ 2/9 Scores 07/09/2019 05/05/2019 04/15/2019 03/04/2019  PHQ - 2 Score 4 6 2  0  PHQ- 9 Score 11 21 8  -    BP Readings from Last 3 Encounters:  07/09/19 128/82  05/05/19 118/78  04/15/19 112/64    Physical Exam Vitals and nursing note reviewed.  Constitutional:      General: She is not in acute distress.    Appearance: Normal appearance. She is well-developed.  HENT:     Head: Normocephalic and atraumatic.  Cardiovascular:     Rate and Rhythm: Normal rate and regular rhythm.     Pulses: Normal pulses.  Pulmonary:     Effort: Pulmonary effort is normal. No respiratory distress.  Abdominal:     General: Abdomen is flat.     Palpations: Abdomen is soft.  Musculoskeletal:     Cervical back: Normal range of motion.     Right lower leg: No edema.     Left lower leg: No edema.  Lymphadenopathy:     Cervical: No cervical adenopathy.  Skin:    General: Skin is warm and dry.     Capillary Refill: Capillary refill takes less than 2 seconds.     Findings: Lesion present. No rash.     Comments: 2 mm raised lesion on abdomen with tiny warty center  Neurological:     Mental Status: She is alert and oriented to person, place, and time.  Psychiatric:        Behavior: Behavior normal.        Thought Content: Thought content normal.     Wt Readings from Last 3 Encounters:  07/09/19 167 lb (75.8 kg)  05/05/19 156 lb (70.8 kg)  04/15/19 153 lb (69.4 kg)     BP 128/82   Pulse 81   Temp 98.1 F (36.7 C) (Oral)   Ht 5\' 2"  (1.575 m)   Wt 167 lb (75.8 kg)   SpO2 98%   BMI 30.54 kg/m   Assessment and Plan: 1. Moderate episode of recurrent major depressive disorder (HCC) Clinically and symptomatically improved on Trintellix 5 mg Will increase to 10 mg per day I still advocate for her to get Psych care but since she is improved, will continue to follow - vortioxetine HBr (TRINTELLIX) 10 MG TABS tablet; Take 1 tablet (10 mg total) by mouth daily.  Dispense: 30 tablet; Refill: 1  2. Neoplasm of uncertain behavior of skin Refer back to her Dermatology Dr. Aubery Lapping - Ambulatory referral to Dermatology  3. BMI 30.0-30.9,adult Will restart phentermine and follow up in 8 weeks. - phentermine (ADIPEX-P) 37.5 MG tablet; Take 1 tablet (37.5 mg total) by mouth daily before breakfast.  Dispense: 30 tablet; Refill: 1   Partially dictated using Editor, commissioning. Any errors are unintentional.  [name], MD [facility]  07/09/2019

## 2019-07-25 ENCOUNTER — Other Ambulatory Visit: Payer: Self-pay

## 2019-07-25 DIAGNOSIS — L700 Acne vulgaris: Secondary | ICD-10-CM

## 2019-07-25 DIAGNOSIS — L659 Nonscarring hair loss, unspecified: Secondary | ICD-10-CM

## 2019-07-28 DIAGNOSIS — Z79899 Other long term (current) drug therapy: Secondary | ICD-10-CM | POA: Diagnosis not present

## 2019-07-28 DIAGNOSIS — L700 Acne vulgaris: Secondary | ICD-10-CM | POA: Diagnosis not present

## 2019-08-12 DIAGNOSIS — D225 Melanocytic nevi of trunk: Secondary | ICD-10-CM | POA: Diagnosis not present

## 2019-08-12 DIAGNOSIS — D485 Neoplasm of uncertain behavior of skin: Secondary | ICD-10-CM | POA: Diagnosis not present

## 2019-08-17 ENCOUNTER — Other Ambulatory Visit: Payer: Self-pay | Admitting: Internal Medicine

## 2019-08-17 DIAGNOSIS — F411 Generalized anxiety disorder: Secondary | ICD-10-CM

## 2019-09-01 ENCOUNTER — Ambulatory Visit: Payer: Medicaid Other | Admitting: Internal Medicine

## 2019-09-10 ENCOUNTER — Other Ambulatory Visit: Payer: Self-pay

## 2019-09-10 DIAGNOSIS — L659 Nonscarring hair loss, unspecified: Secondary | ICD-10-CM

## 2019-09-10 DIAGNOSIS — L814 Other melanin hyperpigmentation: Secondary | ICD-10-CM

## 2019-09-16 DIAGNOSIS — L650 Telogen effluvium: Secondary | ICD-10-CM | POA: Diagnosis not present

## 2019-09-16 DIAGNOSIS — E559 Vitamin D deficiency, unspecified: Secondary | ICD-10-CM | POA: Diagnosis not present

## 2019-09-16 DIAGNOSIS — L811 Chloasma: Secondary | ICD-10-CM | POA: Diagnosis not present

## 2019-09-20 ENCOUNTER — Other Ambulatory Visit: Payer: Self-pay | Admitting: Internal Medicine

## 2019-09-20 DIAGNOSIS — F331 Major depressive disorder, recurrent, moderate: Secondary | ICD-10-CM

## 2019-09-20 NOTE — Telephone Encounter (Signed)
Requested Prescriptions  Pending Prescriptions Disp Refills  . TRINTELLIX 10 MG TABS tablet [Pharmacy Med Name: TRINTELLIX 10 MG TABLET] 90 tablet 1    Sig: TAKE 1 TABLET BY MOUTH EVERY DAY     Psychiatry: Antidepressants - Serotonin Modulator Passed - 09/20/2019  3:37 PM      Passed - Completed PHQ-2 or PHQ-9 in the last 360 days.      Passed - Valid encounter within last 6 months    Recent Outpatient Visits          2 months ago Moderate episode of recurrent major depressive disorder [facility])   [facility] Glean Hess, MD   4 months ago Moderate episode of recurrent major depressive disorder [facility])   [facility] Glean Hess, MD   5 months ago Red eye   [facility] Glean Hess, MD   6 months ago Alopecia of scalp   [facility] Glean Hess, MD   1 year ago Dry eye syndrome of both eyes   [facility] Glean Hess, MD      Future Appointments            In 1 week [facility] Melia Jesse Sans, MD [facility]

## 2019-09-29 ENCOUNTER — Ambulatory Visit: Payer: Medicaid Other | Admitting: Internal Medicine

## 2019-10-02 IMAGING — US US MFM OB FOLLOW-UP
1 series · 13 of 28 positions shown · non-contrast
Comparison: none

PATIENT INFO:

PERFORMED BY:
                   Sonographer
                   DONACIANO CNM
SERVICE(S) PROVIDED:
 ----------------------------------------------------------------------
INDICATIONS:
  15 weeks gestation of pregnancy
FETAL EVALUATION:
 Num Of Fetuses:         1
 Fetal Heart Rate(bpm):  150
 Presentation:           Variable
 Placenta:               Anterior
GESTATIONAL AGE:
 LMP:           14w 3d        Date:  03/15/18                 EDD:   12/20/18
 Best:          15w 0d     Det. By:  Early Ultrasound         EDD:   12/16/18
                                     (05/15/18)

[Series 1: us mfm ob follow-up · 0.20mm/px · 34 acquisitions, 13 frames shown]
[im 2/34]
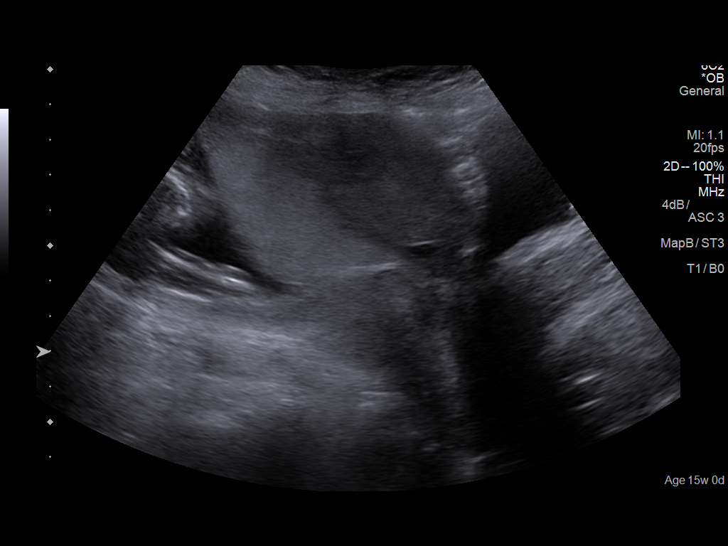
[im 4/34]
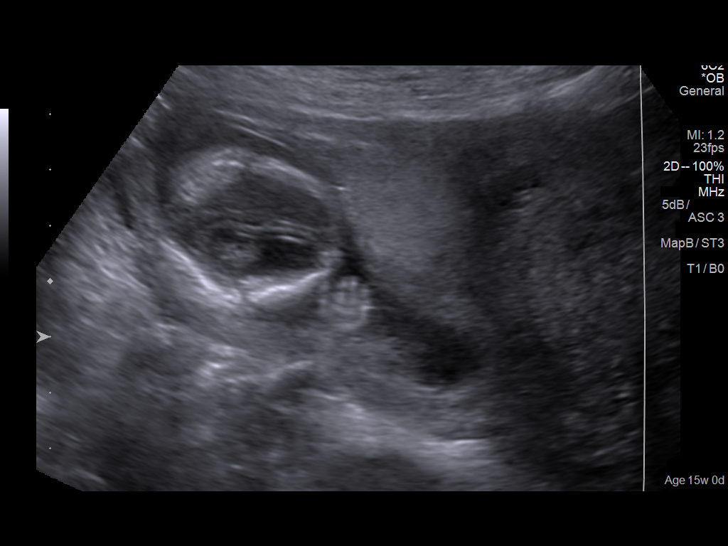
[im 7/34]
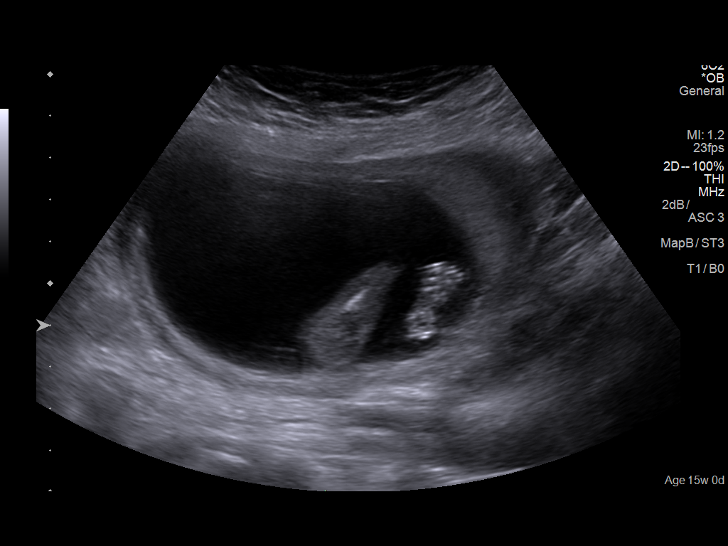
[im 9/34]
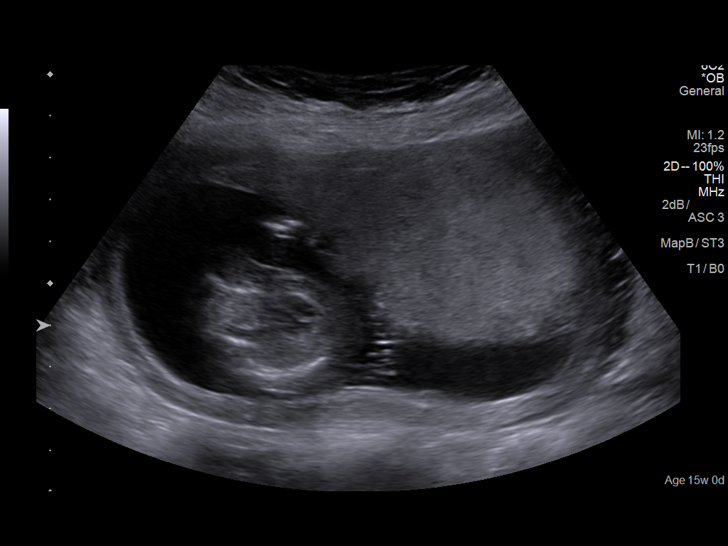
[im 12/34]
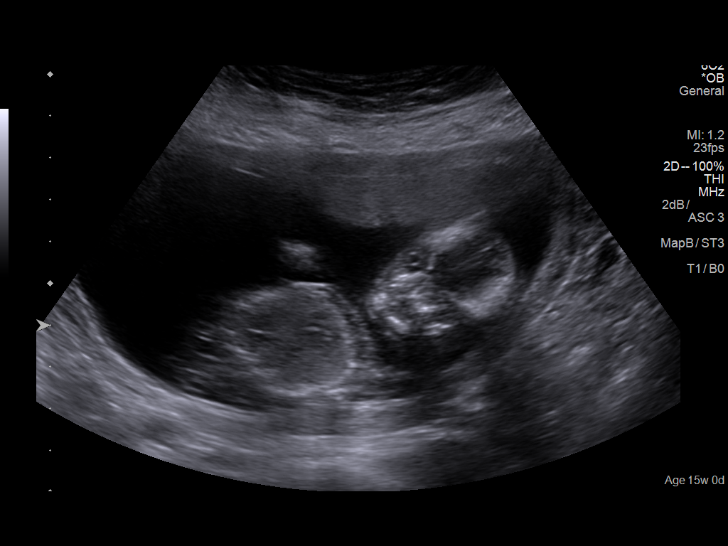
[im 14/34]
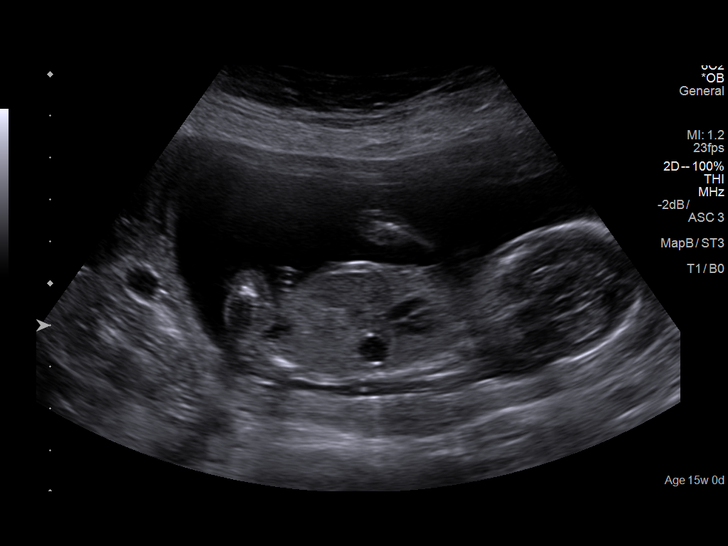
[im 18/34]
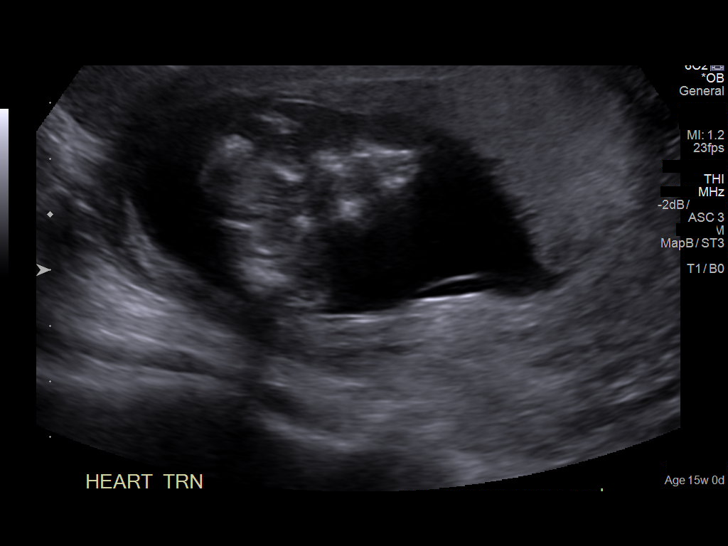
[im 20/34]
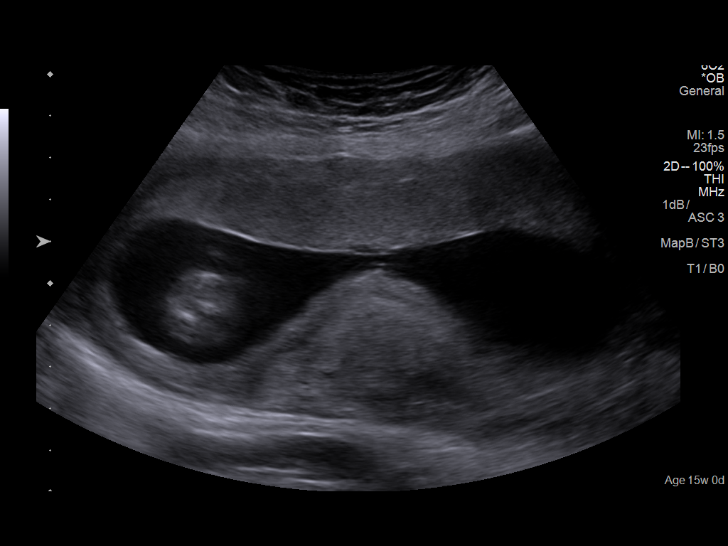
[im 23/34]
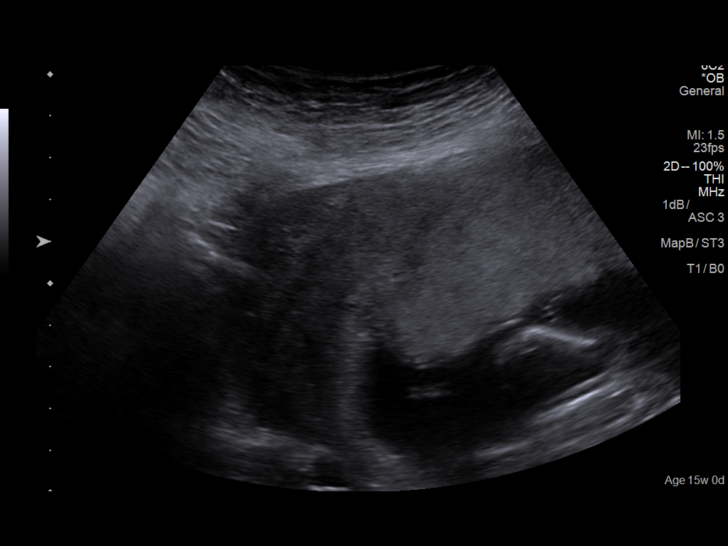
[im 25/34]
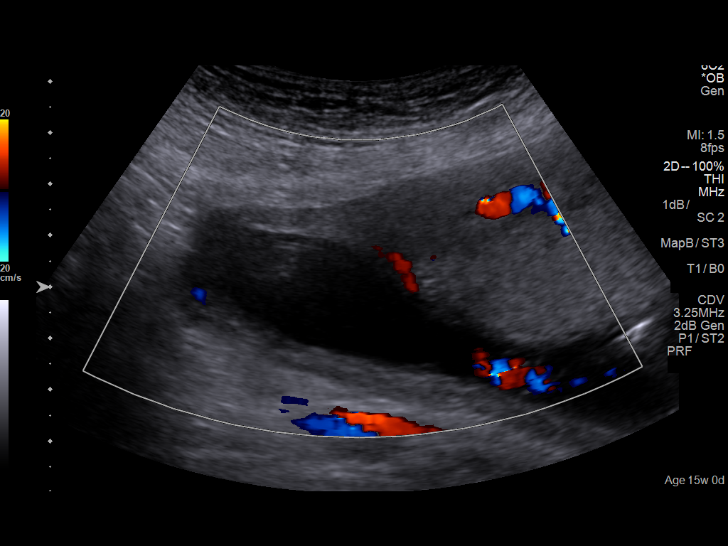
[im 27/34]
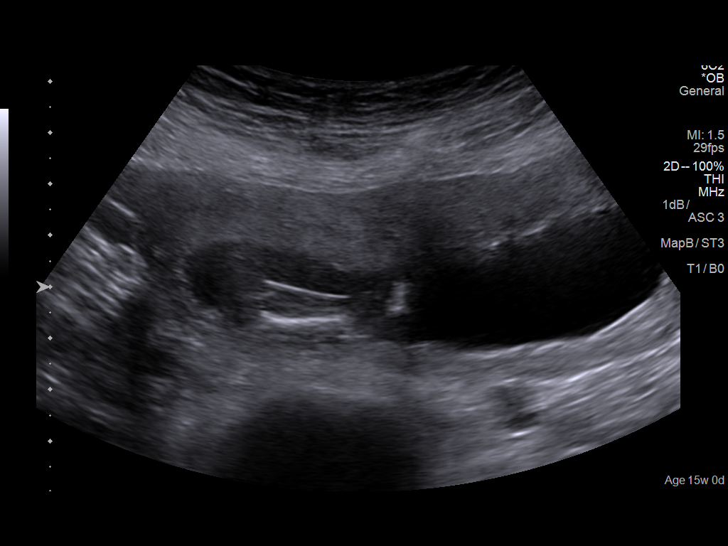
[im 30/34]
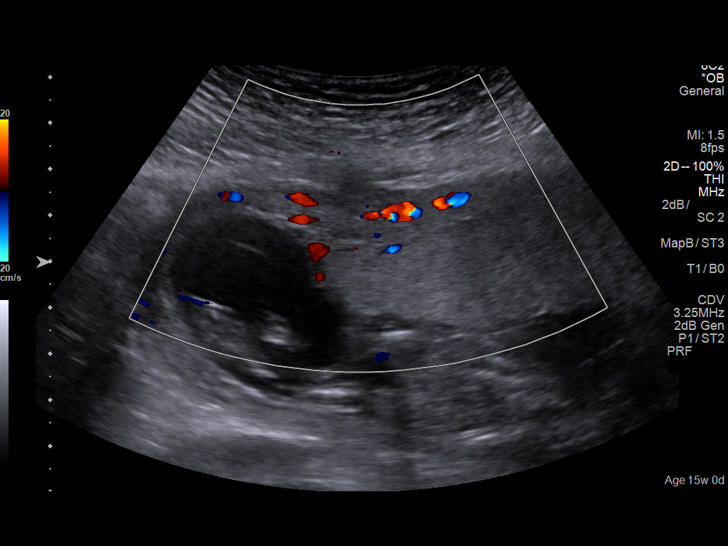
[im 32/34]
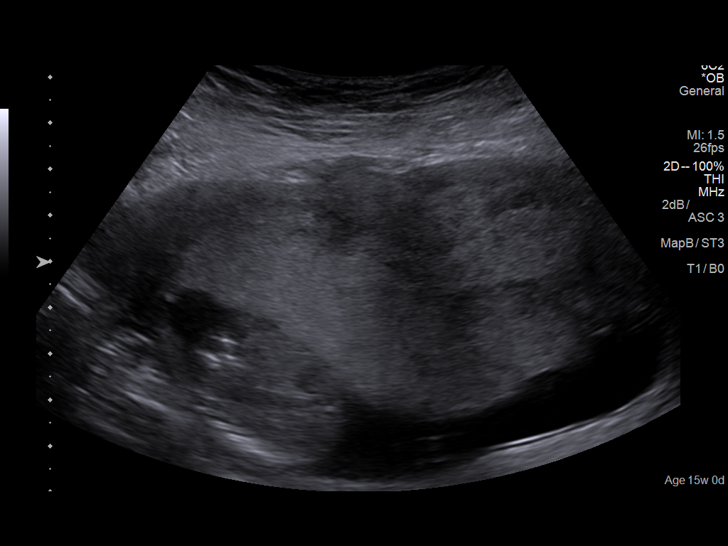

[13 of 28 positions shown; findings below may reference images not displayed]

IMPRESSION: ,

 Thank you for referring your patient  for an ultrasound guided
 genetic amniocentesis.

 The amniotic fluid volume is subjectively normal.

 The patient was counseled of the risks and benefits of
 amniocentesis including a procedure related loss rate of 1 in
 200.  The patient was counseled of the limitations on
 detecting all forms of congenital anomalies and genetic
 syndromes by fetal karyotypic analysis and by ultrasound.

 The patient stated she understood the procedure's risks and
 benefits and signed the consent form for the procedure.

 _______________________________________

 Procedure Note:
 A time out was performed.

 The abdomen was prepped and draped in the usual sterile
 fashion. Using a 3.5 MHz curvilinear transducer for direct
 ultrasound guidance, a 22 gauge 3.5 inch spinal needle was
 introduced, and 45cc
   of clear amniotic fluid was obtained without complications.
    insertion(s). The amniotic fluid was sent for karyotype,
 microarry, and CF.

 Normal fetal heart rate was obtained immediately post-
 amniocentesis.

 The maternal Rh status was noted to be positive.

 The patient was given post-amniocentesis instructions.

 A follow-up ultrasound exam is recommended in 3   weeks to
 evaluate the fetal anatomy.

 Thank you for allowing us to participate in your patient's care.
 questions.
                  Reategui, Vishnu

## 2019-10-23 IMAGING — US US MFM OB DETAIL+14 WK
1 series · 12 of 28 positions shown · non-contrast
Comparison: none

PATIENT INFO:

PERFORMED BY:
                   Sonographer
                   ODDVIN CNM
SERVICE(S) PROVIDED:
 ----------------------------------------------------------------------
INDICATIONS:
  18 weeks gestation of pregnancy
FETAL EVALUATION:
 Num Of Fetuses:         1
 Fetal Heart Rate(bpm):  155
 Presentation:           Variable
 Placenta:               Anterior
BIOMETRY:
 BPD:      40.7  mm     G. Age:  18w 2d         66  %    CI:        70.76   %    70 - 86
                                                         FL/HC:      16.9   %    15.8 - 18
 HC:      154.2  mm     G. Age:  18w 3d         61  %
                                                         FL/BPD:     63.9   %
 FL:         26  mm     G. Age:  17w 6d         41  %
 HUM:      26.2  mm     G. Age:  18w 1d         64  %
 CER:      17.7  mm     G. Age:  17w 5d         43  %
 CM:        5.8  mm
GESTATIONAL AGE:
 LMP:           17w 3d        Date:  03/15/18                 EDD:   12/20/18
 U/S Today:     18w 1d                                        EDD:   12/15/18
 Best:          18w 0d     Det. By:  Early Ultrasound         EDD:   12/16/18
                                     (05/15/18)
ANATOMY:
 Cavum:                 CSP visualized         Ductal Arch:            Normal appearance
 Ventricles:            Normal appearance      Diaphragm:              Within Normal Limits
 Cerebellum:            Within Normal Limits   Stomach:                Seen
 Posterior Fossa:       Within Normal Limits   Abdomen:                Within Normal
                                                                       Limits
 Nuchal Fold:           Within Normal Limits   Abdominal Wall:         Normal appearance
 Face:                  Orbits visualized      Cord Vessels:           3 vessels
 Lips:                  Normal appearance      Kidneys:                Not well seen
 Thoracic:              Within Normal Limits   Bladder:                Seen
 Heart:                 Not seen due to        Spine:                  Normal appearance
                        fetal position
 RVOT:                  Normal appearance      Upper Extremities:      Visualized
 LVOT:                  Not visualized         Lower Extremities:      Visualized
 Aortic Arch:           Not visualized
 Other:  Limited views of kidneys/spine.  LVOT, 3 vessel view, Aortic Arch not
         visualized
CERVIX UTERUS ADNEXA:
 Cervix
 Length:           3.87  cm.

[Series 1: us mfm ob detail+14 wk · 0.17mm/px · 12 of 90 slices shown]
[im 4/90]
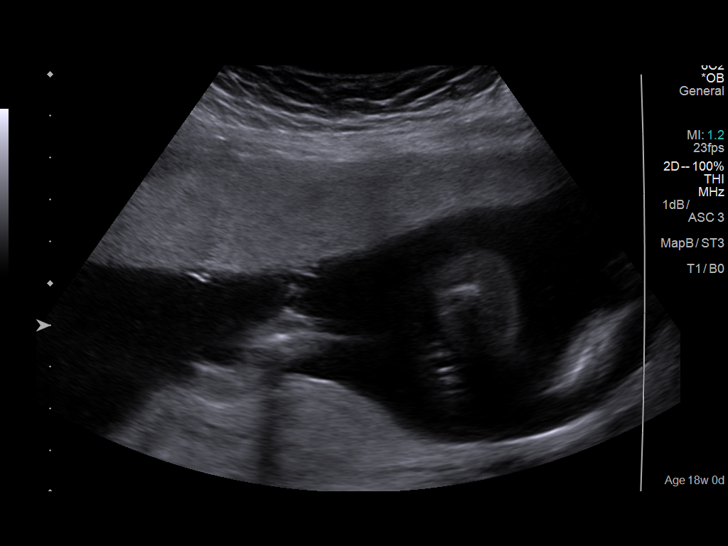
[im 10/90]
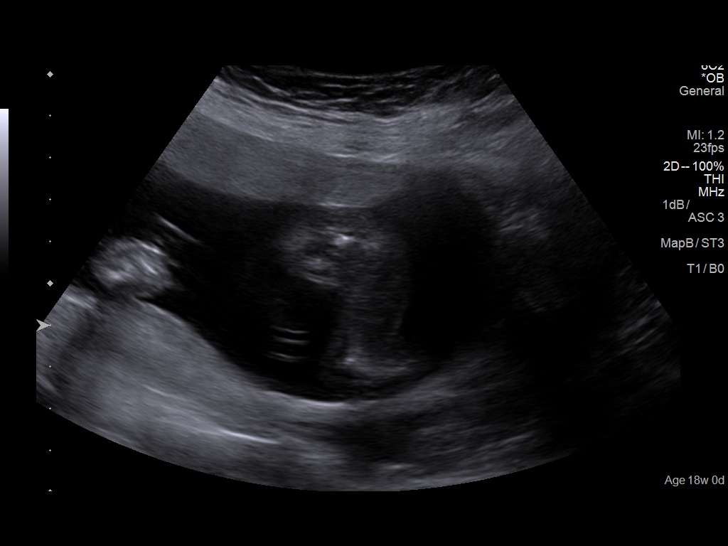
[im 17/90]
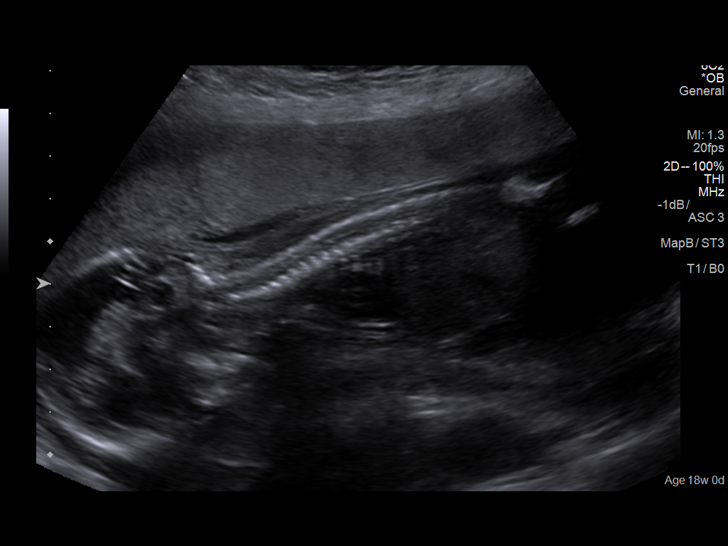
[im 27/90]
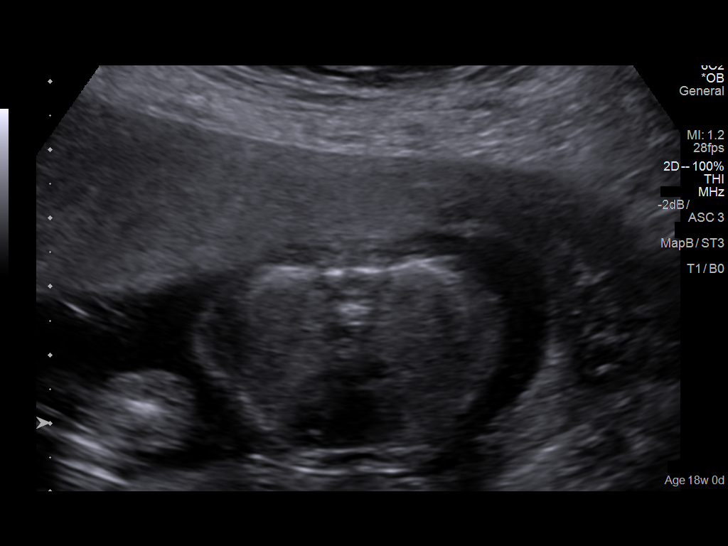
[im 33/90]
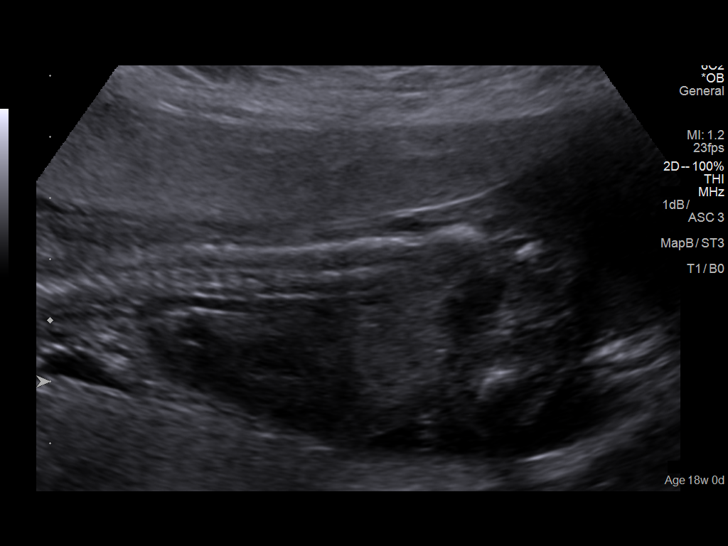
[im 40/90]
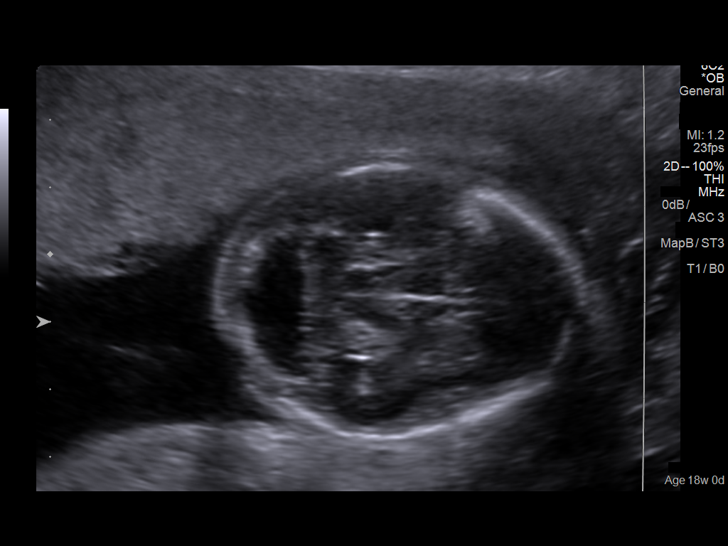
[im 50/90]
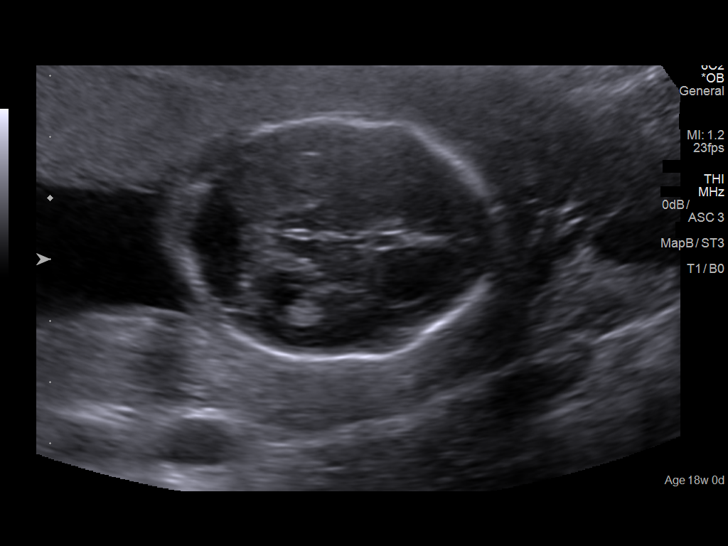
[im 57/90]
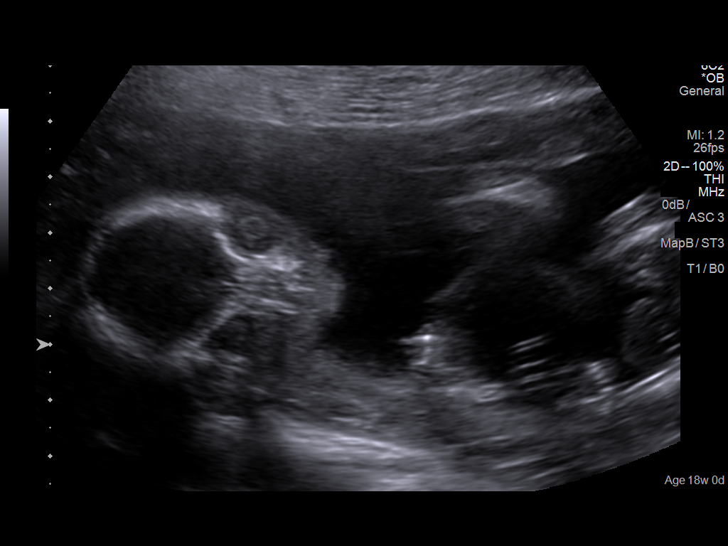
[im 63/90]
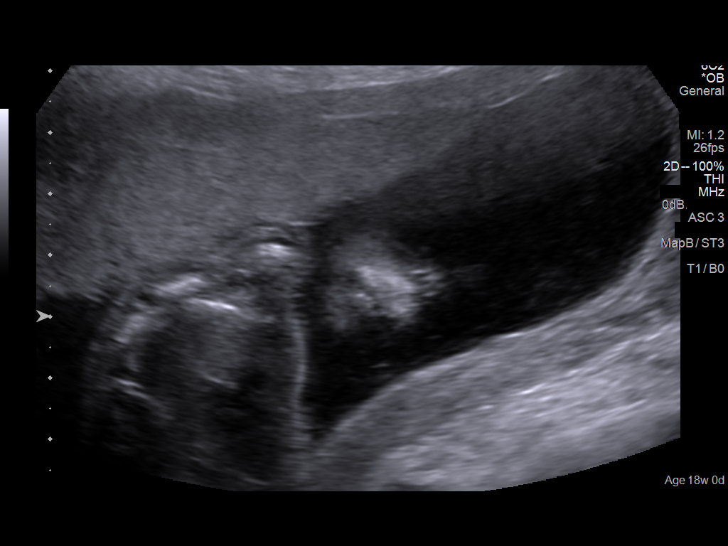
[im 73/90]
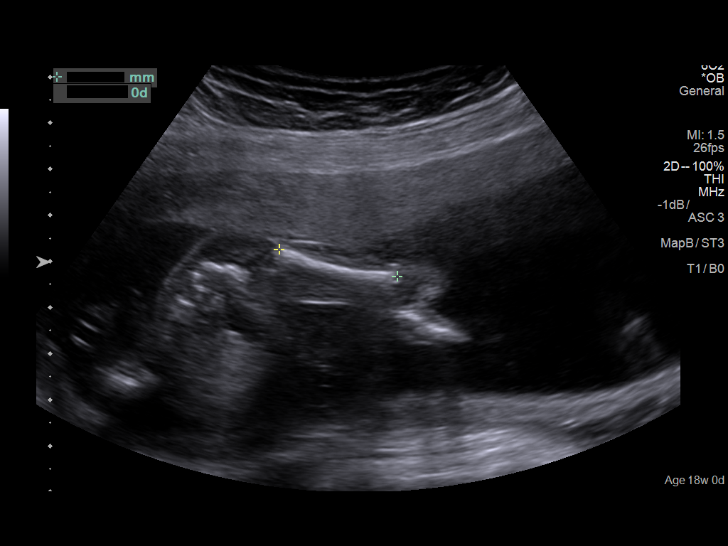
[im 80/90]
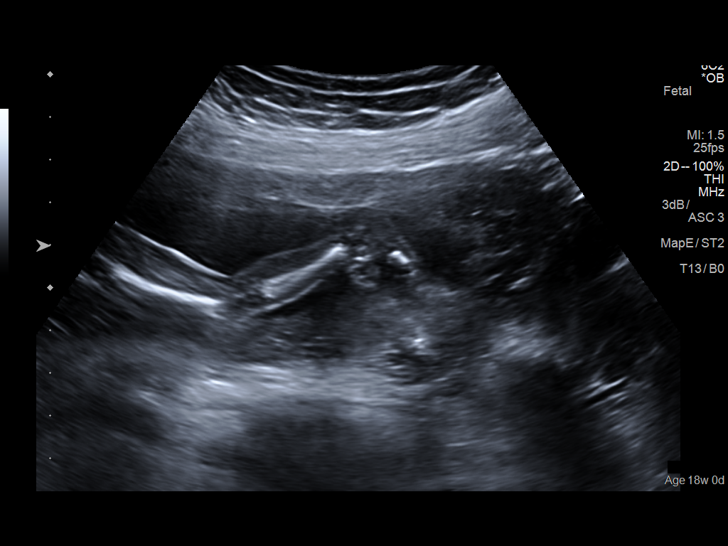
[im 86/90]
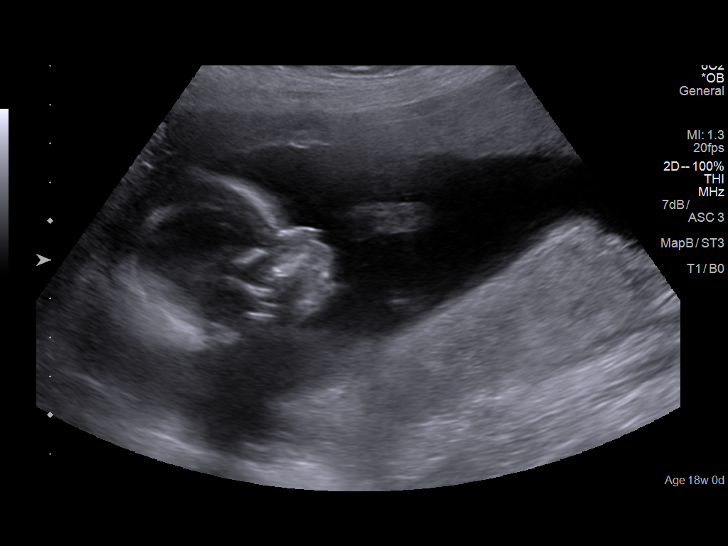

[12 of 28 positions shown; findings below may reference images not displayed]

IMPRESSION: Thank you for referring your patient t for a detailed fetal
 anatomical survey due to maternal cffDNA screening
 demonstrating atypical (unable to characterize fully but
 suggestive of chromosome 13
 abnormality/deletion/duplication/mosaicism, non-maternal in
 origin).  She underwent a genetic amniocentesis at 15 weeks
 gestation.  Initial karyotype results are normal XY (final
 results and microarray pending maternal cell contaminantion
 results) with final results expected by the end of the week.

 Ultrasound demonstrates a single, live fetus at 18 weeks
 gestation.  Dating is by LMP consistent with ultrasound
 performed at [facility] on 05/15/18; measurements were
 consistent with 9 weeks 2 days.

 The fetal biometry correlates with established dating.  The
 amniotic fluid volume is normal and active fetal movements
 are seen.

 Detailed evaluation of the fetal anatomy was performed.  Due
 to fetal posisitoning, images of the fetal heart (4 CH, 3VV,
 RVOT, Aortic Arch), profile, and kidneys were suboptimally
 seen.  The fetal cisterna magna appears prominent but
 measures normal.  The remainder of the fetal anatomic
 survey appears unremarkable.

 Recommend follow up in 1 week to complete anatomics
 survey.

 Thank you for allowing us to participate in your patient's care.
 assistance.

                  [place], Gi

## 2019-12-17 DIAGNOSIS — L738 Other specified follicular disorders: Secondary | ICD-10-CM | POA: Diagnosis not present

## 2019-12-17 DIAGNOSIS — L650 Telogen effluvium: Secondary | ICD-10-CM | POA: Diagnosis not present

## 2020-01-26 ENCOUNTER — Other Ambulatory Visit: Payer: Self-pay | Admitting: Internal Medicine

## 2020-01-26 DIAGNOSIS — F5101 Primary insomnia: Secondary | ICD-10-CM

## 2020-01-26 DIAGNOSIS — F411 Generalized anxiety disorder: Secondary | ICD-10-CM

## 2020-01-26 NOTE — Telephone Encounter (Signed)
Requested medication (s) are due for refill today: Yes  Requested medication (s) are on the active medication list:  Yes  Future visit scheduled:  No  Last Refill: Trazodone 04/15/19; #30; RF x 5                    Alprazolam 08/17/19; #20/ RF 0  Notes to clinic:  last seen in 06/2019; was to f/u in 2 mos.  Attempted to call pt. To sched. Appt.  Left vm to call office to schedule f/u appt.  Alprazolam is not delegated.   Requested Prescriptions  Pending Prescriptions Disp Refills   traZODone (DESYREL) 50 MG tablet [Pharmacy Med Name: TRAZODONE 50 MG TABLET] 30 tablet 5    Sig: Take 0.5-1 tablets (25-50 mg total) by mouth at bedtime as needed for sleep.      Psychiatry: Antidepressants - Serotonin Modulator Failed - 01/26/2020  5:15 PM      Failed - Valid encounter within last 6 months    Recent Outpatient Visits           6 months ago Moderate episode of recurrent major depressive disorder [facility])   [facility] Glean Hess, [facility]   8 months ago Moderate episode of recurrent major depressive disorder [facility])   [facility] Glean Hess, [facility]   9 months ago Red eye   [facility] Glean Hess, MD   10 months ago Alopecia of scalp   [facility] Glean Hess, MD   2 years ago Dry eye syndrome of both eyes   [facility] Glean Hess, MD              Passed - Completed PHQ-2 or PHQ-9 in the last 360 days.        ALPRAZolam (XANAX) 0.25 MG tablet [Pharmacy Med Name: ALPRAZOLAM 0.25 MG TABLET] 20 tablet 0    Sig: TAKE 0.5 TABLETS (0.125 MG TOTAL) BY MOUTH 2 (TWO) TIMES DAILY AS NEEDED FOR ANXIETY.      Not Delegated - Psychiatry:  Anxiolytics/Hypnotics Failed - 01/26/2020  5:15 PM      Failed - This refill cannot be delegated      Failed - Urine Drug Screen completed in last 360 days.      Failed - Valid encounter within last 6 months    Recent Outpatient Visits           6 months ago Moderate episode of  recurrent major depressive disorder [facility])   [facility]an [facility], [facility]   8 months ago Moderate episode of recurrent major depressive disorder [facility])   [facility] Glean Hess, [facility]   9 months ago Red eye   [facility] Glean Hess, MD   10 months ago Alopecia of scalp   [facility] Glean Hess, MD   2 years ago Dry eye syndrome of both eyes   [facility] Glean Hess, MD

## 2020-02-16 NOTE — Telephone Encounter (Signed)
Please look at

## 2020-02-25 ENCOUNTER — Telehealth: Payer: Self-pay | Admitting: Internal Medicine

## 2020-02-25 NOTE — Telephone Encounter (Signed)
She can buy ibuprofen over the counter.

## 2020-02-25 NOTE — Telephone Encounter (Signed)
Pt is calling and unable to get in contact with her ob-gyn the provider is no longer there. Pt would like a refill on ibuprofen for nerve pain for her tooth. When the pt swallows her throat is sore  and ear discomfort. Pt does not have the money to see dentist yet . Pt has an appt with dr berglund on 03/01/2020. [facility]

## 2020-02-25 NOTE — Telephone Encounter (Signed)
Sent my chart message informing patient of this.   CM

## 2020-03-01 ENCOUNTER — Encounter: Payer: Self-pay | Admitting: Internal Medicine

## 2020-03-01 ENCOUNTER — Other Ambulatory Visit: Payer: Self-pay

## 2020-03-01 ENCOUNTER — Ambulatory Visit (INDEPENDENT_AMBULATORY_CARE_PROVIDER_SITE_OTHER): Payer: Medicaid Other | Admitting: Internal Medicine

## 2020-03-01 VITALS — BP 118/70 | HR 76 | Ht 62.0 in | Wt 179.0 lb

## 2020-03-01 DIAGNOSIS — F5101 Primary insomnia: Secondary | ICD-10-CM

## 2020-03-01 DIAGNOSIS — Z23 Encounter for immunization: Secondary | ICD-10-CM | POA: Diagnosis not present

## 2020-03-01 DIAGNOSIS — F411 Generalized anxiety disorder: Secondary | ICD-10-CM

## 2020-03-01 DIAGNOSIS — R0981 Nasal congestion: Secondary | ICD-10-CM

## 2020-03-01 DIAGNOSIS — F331 Major depressive disorder, recurrent, moderate: Secondary | ICD-10-CM

## 2020-03-01 MED ORDER — FLUTICASONE PROPIONATE 50 MCG/ACT NA SUSP: 2.0000 | Freq: Every day | NASAL | 2 refills | Status: DC

## 2020-03-01 MED ORDER — TRAZODONE HCL 50 MG PO TABS: 50.0000 mg | ORAL_TABLET | Freq: Every evening | ORAL | 1 refills | Status: DC | PRN

## 2020-03-01 MED ORDER — ALPRAZOLAM 0.25 MG PO TABS: 0.1250 mg | ORAL_TABLET | Freq: Two times a day (BID) | ORAL | 0 refills | Status: DC | PRN

## 2020-03-01 MED ORDER — VORTIOXETINE HBR 10 MG PO TABS: 10.0000 mg | ORAL_TABLET | Freq: Every day | ORAL | 1 refills | Status: DC

## 2020-03-01 MED ORDER — IBUPROFEN 800 MG PO TABS
800.0000 mg | ORAL_TABLET | Freq: Three times a day (TID) | ORAL | 2 refills | Status: DC | PRN
Start: 2020-03-01 — End: 2020-05-13

## 2020-03-01 NOTE — Progress Notes (Signed)
Date:  03/01/2020   Name:  Christine Arellano   DOB:  [birth year]   MRN:  [id number]   Chief Complaint: Nasal Congestion (Congestion x 2-3 months. Using nasal spray up to 5 times daily. Gets worse by the afternoon and night is the worst. Nausea x 2 weeks. Neg pregnancy test at home. ), Weight Gain, and Flu Vaccine (Reg dose.)  Depression        This is a chronic problem.The problem is unchanged.  Associated symptoms include no fatigue, no appetite change, no headaches and no suicidal ideas.     The symptoms are aggravated by family issues and social issues.  Past treatments include SNRIs - Serotonin and norepinephrine reuptake inhibitors and other medications.  Compliance with treatment is good.  Previous treatment provided moderate relief.  Nasal congestion - she has been using Afrin or similar several times per day for weeks.  She noticed that if she does not use the spray, her congestion is even worse.  She has some PND and ear congestion off and on.  Lab Results  Component Value Date   CREATININE 0.47 (L) 05/31/2018   BUN 8 05/31/2018   NA 139 05/31/2018   K 4.2 05/31/2018   CL 103 05/31/2018   CO2 19 (L) 05/31/2018   No results found for: CHOL, HDL, LDLCALC, LDLDIRECT, TRIG, CHOLHDL No results found for: TSH No results found for: HGBA1C Lab Results  Component Value Date   WBC 6.3 11/06/2018   HGB 13.4 11/06/2018   HCT 42.0 11/06/2018   MCV 88 11/06/2018   PLT 278 11/06/2018   Lab Results  Component Value Date   ALT 8 05/31/2018   AST 11 05/31/2018   ALKPHOS 40 05/31/2018   BILITOT <0.2 05/31/2018     Review of Systems  Constitutional: Positive for unexpected weight change. Negative for appetite change, fatigue and fever.  HENT: Negative for tinnitus and trouble swallowing.   Eyes: Negative for visual disturbance.  Respiratory: Negative for cough, chest tightness and shortness of breath.   Cardiovascular: Negative for chest pain, palpitations and leg swelling.    Gastrointestinal: Negative for abdominal pain.  Endocrine: Negative for polydipsia and polyuria.  Genitourinary: Negative for dysuria and hematuria.  Musculoskeletal: Negative for arthralgias.  Neurological: Negative for tremors, numbness and headaches.  Hematological: Negative for adenopathy.  Psychiatric/Behavioral: Positive for depression and sleep disturbance. Negative for dysphoric mood and suicidal ideas. The patient is nervous/anxious.     Patient Active Problem List   Diagnosis Date Noted  . Inverted nipple 05/05/2019  . Red eye 05/05/2019  . Moderate episode of recurrent major depressive disorder ([place]) 05/05/2019  . Acute midline thoracic back pain 05/05/2019  . BMI 28.0-28.9,adult 05/05/2019  . 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  . Generalized anxiety disorder 03/04/2019  . Alopecia of scalp 03/04/2019  . normal karyotype after Abnormal chromosomal and genetic finding on antenatal screening mother   . GBS bacteriuria 05/22/2018  . Dry eye syndrome of both eyes 10/18/2017  . Adult acne 05/03/2015  . S/P cesarean section 02/24/2015    No Known Allergies  Past Surgical History:  Procedure Laterality Date  . arthroscopic knee Right 2008  . CESAREAN SECTION  2014  . CESAREAN SECTION N/A 02/24/2015   Procedure: CESAREAN SECTION;  Surgeon: Rubie Maid, MD;  Location: [facility] ORS;  Service: Obstetrics;  Laterality: N/A;  . CESAREAN SECTION N/A 05/09/2017   Procedure: REPEAT CESAREAN SECTION;  Surgeon: Rubie Maid, MD;  Location: [facility] ORS;  Service: Obstetrics;  Laterality: N/A;  . HEMORROIDECTOMY    . THERAPEUTIC ABORTION  08/2018   fetal CNS abnormalities  . TONSILLECTOMY    . WISDOM TOOTH EXTRACTION      Social History   Tobacco Use  . Smoking status: Former Smoker    Packs/day: 1.00    Types: Cigarettes    Quit date: 08/27/2009    Years since quitting: 10.5  . Smokeless tobacco: Never Used  Vaping Use  . Vaping Use: Never used  Substance Use Topics  . Alcohol use:  Not Currently    Alcohol/week: 0.0 standard drinks  . Drug use: No     Medication list has been reviewed and updated.  Current Meds  Medication Sig  . ALPRAZolam (XANAX) 0.25 MG tablet TAKE 0.5 TABLETS (0.125 MG TOTAL) BY MOUTH 2 (TWO) TIMES DAILY AS NEEDED FOR ANXIETY.  . cyclobenzaprine (FLEXERIL) 10 MG tablet Take 1 tablet (10 mg total) by mouth at bedtime.  Marland Kitchen ibuprofen (ADVIL) 800 MG tablet Take 1 tablet (800 mg total) by mouth every 8 (eight) hours as needed.  . norgestimate-ethinyl estradiol (ORTHO-CYCLEN) 0.25-35 MG-MCG tablet Take 1 tablet by mouth daily.  . traZODone (DESYREL) 50 MG tablet Take 0.5-1 tablets (25-50 mg total) by mouth at bedtime as needed for sleep. (Patient taking differently: Take 50 mg by mouth at bedtime as needed for sleep. )  . triamcinolone cream (KENALOG) 0.1 % Apply 1 application topically 2 (two) times daily.  . TRINTELLIX 10 MG TABS tablet TAKE 1 TABLET BY MOUTH EVERY DAY  . Vitamin D, Ergocalciferol, (DRISDOL) 1.25 MG (50000 UNIT) CAPS capsule Take 50,000 Units by mouth once a week.    PHQ 2/9 Scores 03/01/2020 07/09/2019 05/05/2019 04/15/2019  PHQ - 2 Score 6 4 6 2   PHQ- 9 Score 21 11 21 8     GAD 7 : Generalized Anxiety Score 03/01/2020 05/05/2019  Nervous, Anxious, on Edge 3 2  Control/stop worrying 3 3  Worry too much - different things 3 3  Trouble relaxing 3 1  Restless 3 2  Easily annoyed or irritable 3 3  Afraid - awful might happen 3 1  Total GAD 7 Score 21 15  Anxiety Difficulty Very difficult Very difficult    BP Readings from Last 3 Encounters:  03/01/20 118/70  07/09/19 128/82  05/05/19 118/78    Physical Exam Vitals and nursing note reviewed.  Constitutional:      General: She is not in acute distress.    Appearance: Normal appearance. She is well-developed.  HENT:     Head: Normocephalic and atraumatic.     Right Ear: Hearing, tympanic membrane and ear canal normal.     Left Ear: Hearing, tympanic membrane and ear canal  normal.     Nose:     Right Sinus: No maxillary sinus tenderness or frontal sinus tenderness.     Left Sinus: No maxillary sinus tenderness or frontal sinus tenderness.     Mouth/Throat:     Lips: Pink.     Mouth: Mucous membranes are moist.     Pharynx: Oropharynx is clear.  Cardiovascular:     Rate and Rhythm: Normal rate and regular rhythm.     Heart sounds: No murmur heard.   Pulmonary:     Effort: Pulmonary effort is normal. No respiratory distress.  Musculoskeletal:        General: Normal range of motion.     Cervical back: Normal range of motion.     Right lower leg: No edema.  Left lower leg: No edema.  Lymphadenopathy:     Cervical: No cervical adenopathy.  Skin:    General: Skin is warm and dry.     Findings: No rash.  Neurological:     Mental Status: She is alert and oriented to person, place, and time.  Psychiatric:        Behavior: Behavior normal.        Thought Content: Thought content normal.     Wt Readings from Last 3 Encounters:  03/01/20 179 lb (81.2 kg)  07/09/19 167 lb (75.8 kg)  05/05/19 156 lb (70.8 kg)    BP 118/70   Pulse 76   Ht 5\' 2"  (1.575 m)   Wt 179 lb (81.2 kg)   SpO2 100%   BMI 32.74 kg/m   Assessment and Plan: 1. Chronic nasal congestion Dependent on Afrin to breathe through her nose Instructed to use Flonase on one side until open the use it in both nares daily Avoid additional use of Afrin - fluticasone (FLONASE) 50 MCG/ACT nasal spray; Place 2 sprays into both nostrils daily.  Dispense: 16 g; Refill: 2  2. Moderate episode of recurrent major depressive disorder (HCC) Continue current medications but refer to Psych for better management - vortioxetine HBr (TRINTELLIX) 10 MG TABS tablet; Take 1 tablet (10 mg total) by mouth daily.  Dispense: 90 tablet; Refill: 1 - Ambulatory referral to Psychiatry  3. Generalized anxiety disorder - ALPRAZolam (XANAX) 0.25 MG tablet; Take 0.5 tablets (0.125 mg total) by mouth 2 (two)  times daily as needed for anxiety.  Dispense: 20 tablet; Refill: 0  4. Primary insomnia - traZODone (DESYREL) 50 MG tablet; Take 1 tablet (50 mg total) by mouth at bedtime as needed for sleep.  Dispense: 90 tablet; Refill: 1  5. Need for immunization against influenza - Flu Vaccine QUAD 36+ mos IM   Partially dictated using Editor, commissioning. Any errors are unintentional.  [name], MD [facility]  03/01/2020

## 2020-03-04 ENCOUNTER — Other Ambulatory Visit: Payer: Self-pay

## 2020-03-04 DIAGNOSIS — F331 Major depressive disorder, recurrent, moderate: Secondary | ICD-10-CM

## 2020-03-12 ENCOUNTER — Telehealth: Payer: Self-pay | Admitting: Internal Medicine

## 2020-03-12 NOTE — Telephone Encounter (Signed)
Patient called to ask if the doctor would prescribe a stronger medication for her toothe pain.  She said that the IBU is not strong enough.  CB# [phone]

## 2020-03-24 ENCOUNTER — Other Ambulatory Visit: Payer: Self-pay | Admitting: Internal Medicine

## 2020-03-24 DIAGNOSIS — R0981 Nasal congestion: Secondary | ICD-10-CM

## 2020-03-24 NOTE — Telephone Encounter (Signed)
Requested Prescriptions  Pending Prescriptions Disp Refills   fluticasone (FLONASE) 50 MCG/ACT nasal spray [Pharmacy Med Name: FLUTICASONE PROP 50 MCG SPRAY] 16 mL 2    Sig: SPRAY 2 SPRAYS INTO EACH NOSTRIL EVERY DAY     Ear, Nose, and Throat: Nasal Preparations - Corticosteroids Passed - 03/24/2020  2:30 PM      Passed - Valid encounter within last 12 months    Recent Outpatient Visits          3 weeks ago Chronic nasal congestion   [facility] Glean Hess, MD   8 months ago Moderate episode of recurrent major depressive disorder [facility])   [facility] Glean [facility], [facility]   10 months ago Moderate episode of recurrent major depressive disorder [facility])   [facility] Glean Hess, [facility]   11 months ago Red eye   [facility] Glean Hess, MD   1 year ago Alopecia of scalp   [facility] Glean Hess, [facility]      Future Appointments            In 2 months Army Melia Jesse Sans, MD [facility], [facility]

## 2020-04-25 ENCOUNTER — Other Ambulatory Visit: Payer: Self-pay | Admitting: Internal Medicine

## 2020-04-25 DIAGNOSIS — F411 Generalized anxiety disorder: Secondary | ICD-10-CM

## 2020-04-25 DIAGNOSIS — M546 Pain in thoracic spine: Secondary | ICD-10-CM

## 2020-04-25 NOTE — Telephone Encounter (Signed)
Requested medication (s) are due for refill today: yes   Requested medication (s) are on the active medication list: yes  Last refill:  Flexeril: 05/05/19    alprazolam: 03/01/20  Future visit scheduled: yes  Notes to clinic:  neither med delegated to NT to RF   Requested Prescriptions  Pending Prescriptions Disp Refills   cyclobenzaprine (FLEXERIL) 10 MG tablet [Pharmacy Med Name: CYCLOBENZAPRINE 10 MG TABLET] 30 tablet 1    Sig: TAKE 1 TABLET BY MOUTH EVERYDAY AT BEDTIME      Not Delegated - Analgesics:  Muscle Relaxants Failed - 04/25/2020  7:05 AM      Failed - This refill cannot be delegated      Passed - Valid encounter within last 6 months    Recent Outpatient Visits           1 month ago Chronic nasal congestion   [facility] Glean [facility], [facility]   9 months ago Moderate episode of recurrent major depressive disorder [facility])   [facility] Glean [facility], [facility]   11 months ago Moderate episode of recurrent major depressive disorder [facility])   [facility] Glean [facility], [facility]   1 year ago Red eye   [facility] Glean Hess, MD   1 year ago Alopecia of scalp   [facility] Glean Hess, MD       Future Appointments             In 1 month Army Melia Jesse Sans, MD [facility], [facility]              ALPRAZolam Duanne Moron) 0.25 MG tablet [Pharmacy Med Name: ALPRAZOLAM 0.25 MG TABLET] 20 tablet 0    Sig: TAKE 0.5 TABLETS (0.125 MG TOTAL) BY MOUTH 2 (TWO) TIMES DAILY AS NEEDED FOR ANXIETY.      Not Delegated - Psychiatry:  Anxiolytics/Hypnotics Failed - 04/25/2020  7:05 AM      Failed - This refill cannot be delegated      Failed - Urine Drug Screen completed in last 360 days      Passed - Valid encounter within last 6 months    Recent Outpatient Visits           1 month ago Chronic nasal congestion   [facility] Glean Hess, MD   9 months ago Moderate episode of recurrent major depressive  disorder [facility])   [facility] Glean [facility], MD   11 months ago Moderate episode of recurrent major depressive disorder [facility])   [facility] Glean [facility], [facility]   1 year ago Red eye   [facility] Glean Hess, MD   1 year ago Alopecia of scalp   [facility] Glean [facility], [facility]       Future Appointments             In 1 month Army Melia Jesse Sans, MD [facility], [facility]

## 2020-04-26 NOTE — Telephone Encounter (Signed)
Please Advise. Last office visit 03/01/2020.  KP

## 2020-05-13 ENCOUNTER — Other Ambulatory Visit: Payer: Self-pay | Admitting: Internal Medicine

## 2020-05-13 NOTE — Telephone Encounter (Signed)
Medication Refill - Medication: norgestimate-ethinyl estradiol (ORTHO-CYCLEN) 0.25-35 MG-MCG tablet Pt has been off of this med for a week / please advise   Has the patient contacted their pharmacy? Yes.   (Agent: If no, request that the patient contact the pharmacy for the refill.) (Agent: If yes, when and what did the pharmacy advise?)  Preferred Pharmacy (with phone number or street name):  [id number] - [place], [place] - [address] Phone:  [phone]  Fax:  [phone]       Agent: Please be advised that RX refills may take up to 3 business days. We ask that you follow-up with your pharmacy.

## 2020-05-14 DIAGNOSIS — F332 Major depressive disorder, recurrent severe without psychotic features: Secondary | ICD-10-CM | POA: Diagnosis not present

## 2020-05-17 ENCOUNTER — Telehealth: Payer: Self-pay

## 2020-05-17 NOTE — Telephone Encounter (Unsigned)
Copied from [place] [id number]. Topic: General - Other >> May 17, 2020 12:49 PM Rainey Pines A wrote: Patient requesting a callback from Dr. Oneal Deputy nurse in regards to birth control prescription request.

## 2020-05-17 NOTE — Telephone Encounter (Signed)
Pt wanted to get a RF for birth control.Pt stated that Melody is no longer at [facility]. Told pt that she has a ob/gyn that she should be able to call them and get a RF that another provider should be able to RF birth control. Pt stated that she hasn't seen them since 2020. Told pt that she is still a established pt there. Pt verbalized understanding.  KP

## 2020-06-03 ENCOUNTER — Encounter: Payer: Medicaid Other | Admitting: Internal Medicine

## 2020-06-07 ENCOUNTER — Other Ambulatory Visit: Payer: Self-pay

## 2020-06-07 ENCOUNTER — Ambulatory Visit (INDEPENDENT_AMBULATORY_CARE_PROVIDER_SITE_OTHER): Payer: Medicaid Other | Admitting: Internal Medicine

## 2020-06-07 ENCOUNTER — Encounter: Payer: Self-pay | Admitting: Internal Medicine

## 2020-06-07 VITALS — BP 136/72 | HR 94 | Ht 62.0 in | Wt 193.0 lb

## 2020-06-07 DIAGNOSIS — G43109 Migraine with aura, not intractable, without status migrainosus: Secondary | ICD-10-CM | POA: Diagnosis not present

## 2020-06-07 DIAGNOSIS — N761 Subacute and chronic vaginitis: Secondary | ICD-10-CM

## 2020-06-07 DIAGNOSIS — H6690 Otitis media, unspecified, unspecified ear: Secondary | ICD-10-CM

## 2020-06-07 MED ORDER — AZITHROMYCIN 250 MG PO TABS: ORAL_TABLET | ORAL | 0 refills | Status: AC

## 2020-06-07 MED ORDER — SUMATRIPTAN SUCCINATE 50 MG PO TABS: 50.0000 mg | ORAL_TABLET | ORAL | 2 refills | Status: DC | PRN

## 2020-06-07 MED ORDER — METRONIDAZOLE 500 MG PO TABS: 500.0000 mg | ORAL_TABLET | Freq: Two times a day (BID) | ORAL | 0 refills | Status: DC

## 2020-06-07 MED ORDER — TOPIRAMATE 25 MG PO TABS: 75.0000 mg | ORAL_TABLET | Freq: Every day | ORAL | 2 refills | Status: DC

## 2020-06-07 NOTE — Progress Notes (Signed)
Date:  06/07/2020   Name:  Christine Arellano   DOB:  [birth year]   MRN:  [id number]   Chief Complaint: Migraine (Migraine on and off for 3 weeks now. Blurred vision, nausea with some vomiting. Patient says she has headaches in the past but never like this before and none that lasted this long. ) and Sore Throat (Hurts to swallow and both ears hurt but R) is worse than L). Started 4 days ago.)  Migraine  This is a new problem. The problem occurs constantly. The pain is located in the right unilateral region. The quality of the pain is described as pulsating and throbbing. The pain is severe. Associated symptoms include nausea, phonophobia, photophobia, sinus pressure, a sore throat, a visual change (speckles) and vomiting. Pertinent negatives include no coughing, dizziness, fever, numbness or weakness. She has tried NSAIDs and Excedrin for the symptoms. The treatment provided moderate relief.  Sore Throat  This is a new problem. The current episode started in the past 7 days. The problem has been unchanged. There has been no fever. The pain is moderate. Associated symptoms include congestion, headaches, trouble swallowing and vomiting. Pertinent negatives include no coughing or shortness of breath.    Lab Results  Component Value Date   CREATININE 0.47 (L) 05/31/2018   BUN 8 05/31/2018   NA 139 05/31/2018   K 4.2 05/31/2018   CL 103 05/31/2018   CO2 19 (L) 05/31/2018   No results found for: CHOL, HDL, LDLCALC, LDLDIRECT, TRIG, CHOLHDL No results found for: TSH No results found for: HGBA1C Lab Results  Component Value Date   WBC 6.3 11/06/2018   HGB 13.4 11/06/2018   HCT 42.0 11/06/2018   MCV 88 11/06/2018   PLT 278 11/06/2018   Lab Results  Component Value Date   ALT 8 05/31/2018   AST 11 05/31/2018   ALKPHOS 40 05/31/2018   BILITOT <0.2 05/31/2018     Review of Systems  Constitutional: Negative for chills and fever.  HENT: Positive for congestion, sinus pressure, sore  throat and trouble swallowing.   Eyes: Positive for photophobia.  Respiratory: Negative for cough, chest tightness and shortness of breath.   Cardiovascular: Negative for chest pain.  Gastrointestinal: Positive for nausea and vomiting.  Neurological: Positive for headaches. Negative for dizziness, syncope, weakness and numbness.    Patient Active Problem List   Diagnosis Date Noted  . Inverted nipple 05/05/2019  . Red eye 05/05/2019  . Moderate episode of recurrent major depressive disorder (HCC) 05/05/2019  . BMI 28.0-28.9,adult 05/05/2019  . 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  . Generalized anxiety disorder 03/04/2019  . Alopecia of scalp 03/04/2019  . normal karyotype after Abnormal chromosomal and genetic finding on antenatal screening mother   . GBS bacteriuria 05/22/2018  . Dry eye syndrome of both eyes 10/18/2017  . Adult acne 05/03/2015  . S/P cesarean section 02/24/2015    No Known Allergies  Past Surgical History:  Procedure Laterality Date  . arthroscopic knee Right 2008  . CESAREAN SECTION  2014  . CESAREAN SECTION N/A 02/24/2015   Procedure: CESAREAN SECTION;  Surgeon: Hildred LaserAnika Cherry, MD;  Location: [facility] ORS;  Service: Obstetrics;  Laterality: N/A;  . CESAREAN SECTION N/A 05/09/2017   Procedure: REPEAT CESAREAN SECTION;  Surgeon: Hildred Laserherry, Anika, MD;  Location: [facility] ORS;  Service: Obstetrics;  Laterality: N/A;  . HEMORROIDECTOMY    . THERAPEUTIC ABORTION  08/2018   fetal CNS abnormalities  . TONSILLECTOMY    . WISDOM TOOTH EXTRACTION  Social History   Tobacco Use  . Smoking status: Former Smoker    Packs/day: 1.00    Types: Cigarettes    Quit date: 08/27/2009    Years since quitting: 10.7  . Smokeless tobacco: Never Used  Vaping Use  . Vaping Use: Never used  Substance Use Topics  . Alcohol use: Not Currently    Alcohol/week: 0.0 standard drinks  . Drug use: No     Medication list has been reviewed and updated.  Current Meds  Medication Sig  .  ALPRAZolam (XANAX) 0.25 MG tablet Take 0.5 tablets (0.125 mg total) by mouth 2 (two) times daily as needed for anxiety.  . cyclobenzaprine (FLEXERIL) 10 MG tablet TAKE 1 TABLET BY MOUTH EVERYDAY AT BEDTIME  . fluticasone (FLONASE) 50 MCG/ACT nasal spray SPRAY 2 SPRAYS INTO EACH NOSTRIL EVERY DAY  . ibuprofen (ADVIL) 800 MG tablet TAKE 1 TABLET BY MOUTH EVERY 8 HOURS AS NEEDED  . metroNIDAZOLE (FLAGYL) 500 MG tablet Take 500 mg by mouth 2 (two) times daily.  . traZODone (DESYREL) 50 MG tablet Take 1 tablet (50 mg total) by mouth at bedtime as needed for sleep.  Marland Kitchen triamcinolone cream (KENALOG) 0.1 % Apply 1 application topically 2 (two) times daily.  . Vitamin D, Ergocalciferol, (DRISDOL) 1.25 MG (50000 UNIT) CAPS capsule Take 50,000 Units by mouth once a week.  . vortioxetine HBr (TRINTELLIX) 10 MG TABS tablet Take 1 tablet (10 mg total) by mouth daily.    PHQ 2/9 Scores 06/07/2020 03/01/2020 07/09/2019 05/05/2019  PHQ - 2 Score 4 6 4 6   PHQ- 9 Score 15 21 11 21     GAD 7 : Generalized Anxiety Score 06/07/2020 03/01/2020 05/05/2019  Nervous, Anxious, on Edge 2 3 2   Control/stop worrying 2 3 3   Worry too much - different things 2 3 3   Trouble relaxing 1 3 1   Restless 1 3 2   Easily annoyed or irritable 2 3 3   Afraid - awful might happen 1 3 1   Total GAD 7 Score 11 21 15   Anxiety Difficulty Somewhat difficult Very difficult Very difficult    BP Readings from Last 3 Encounters:  06/07/20 136/72  03/01/20 118/70  07/09/19 128/82    Physical Exam Vitals and nursing note reviewed.  Constitutional:      General: She is not in acute distress.    Appearance: She is well-developed.  HENT:     Head: Normocephalic and atraumatic.     Right Ear: Tympanic membrane and ear canal normal. Tympanic membrane is not erythematous or retracted.     Left Ear: Tympanic membrane is erythematous and retracted.     Nose:     Right Sinus: Frontal sinus tenderness present. No maxillary sinus tenderness.      Left Sinus: Frontal sinus tenderness present. No maxillary sinus tenderness.     Mouth/Throat:     Mouth: Mucous membranes are moist.     Pharynx: Posterior oropharyngeal erythema present.     Tonsils: No tonsillar exudate or tonsillar abscesses. 1+ on the right. 1+ on the left.  Eyes:     Extraocular Movements:     Right eye: Normal extraocular motion.     Left eye: Normal extraocular motion.     Conjunctiva/sclera: Conjunctivae normal.     Pupils: Pupils are equal, round, and reactive to light.  Cardiovascular:     Rate and Rhythm: Normal rate and regular rhythm.  Pulmonary:     Effort: Pulmonary effort is normal. No respiratory distress.  Musculoskeletal:        General: Normal range of motion.     Cervical back: Normal range of motion.  Lymphadenopathy:     Cervical: No cervical adenopathy.  Skin:    General: Skin is warm and dry.     Findings: No rash.  Neurological:     General: No focal deficit present.     Mental Status: She is alert and oriented to person, place, and time.     Cranial Nerves: Cranial nerves are intact.     Motor: Motor function is intact.  Psychiatric:        Mood and Affect: Mood and affect normal.        Behavior: Behavior normal.        Thought Content: Thought content normal.     Wt Readings from Last 3 Encounters:  06/07/20 193 lb (87.5 kg)  03/01/20 179 lb (81.2 kg)  07/09/19 167 lb (75.8 kg)    BP 136/72   Pulse 94   Ht 5\' 2"  (1.575 m)   Wt 193 lb (87.5 kg)   LMP 05/31/2020 (Exact Date)   SpO2 97%   BMI 35.30 kg/m   Assessment and Plan: 1. Migraine with aura and without status migrainosus, not intractable Begin preventative nightly - work up from 1 to 3 topamax at HS Imitrex for abortive therapy Follow up in 6-8 weeks - topiramate (TOPAMAX) 25 MG tablet; Take 3 tablets (75 mg total) by mouth at bedtime.  Dispense: 90 tablet; Refill: 2 - SUMAtriptan (IMITREX) 50 MG tablet; Take 1 tablet (50 mg total) by mouth every 2 (two) hours  as needed for migraine. May repeat in 2 hours if headache persists or recurs.  Dispense: 10 tablet; Refill: 2  2. Acute otitis media, unspecified otitis media type With mild pharyngeal erythema - will cover for URI pathogens - azithromycin (ZITHROMAX Z-PAK) 250 MG tablet; UAD  Dispense: 6 each; Refill: 0  3. Subacute vaginitis Has been taking flagyl for BV sx after intercourse given by GYN - metroNIDAZOLE (FLAGYL) 500 MG tablet; Take 1 tablet (500 mg total) by mouth 2 (two) times daily.  Dispense: 14 tablet; Refill: 0   Partially dictated using Editor, commissioning. Any errors are unintentional.  [name], MD [facility]  06/07/2020

## 2020-06-08 ENCOUNTER — Telehealth: Payer: Self-pay | Admitting: Internal Medicine

## 2020-06-08 NOTE — Telephone Encounter (Signed)
Pt is not on birth control and does having an appt with dr Army Melia on 06-21-2020 for a physical. Pt is concerning about taking topiramate 25 mg due to if she gets pregnant the medication can cause cleft lip etc. Please advise

## 2020-06-09 NOTE — Telephone Encounter (Signed)
Spoke to pt told her to stop taking topiramate. Pt wanted birth control pt has physical 06/22/19. Will discuss at that time. Pt verbalized understanding.  KP

## 2020-06-17 ENCOUNTER — Other Ambulatory Visit: Payer: Self-pay | Admitting: Internal Medicine

## 2020-06-17 DIAGNOSIS — F5101 Primary insomnia: Secondary | ICD-10-CM

## 2020-06-21 ENCOUNTER — Encounter: Payer: Self-pay | Admitting: Internal Medicine

## 2020-06-29 ENCOUNTER — Other Ambulatory Visit: Payer: Self-pay | Admitting: Internal Medicine

## 2020-07-09 ENCOUNTER — Other Ambulatory Visit: Payer: Self-pay

## 2020-07-09 ENCOUNTER — Ambulatory Visit (INDEPENDENT_AMBULATORY_CARE_PROVIDER_SITE_OTHER): Payer: Medicaid Other | Admitting: Internal Medicine

## 2020-07-09 ENCOUNTER — Encounter: Payer: Self-pay | Admitting: Internal Medicine

## 2020-07-09 VITALS — BP 108/68 | HR 100 | Temp 98.1°F | Ht 62.0 in | Wt 196.0 lb

## 2020-07-09 DIAGNOSIS — G43109 Migraine with aura, not intractable, without status migrainosus: Secondary | ICD-10-CM

## 2020-07-09 DIAGNOSIS — Z6835 Body mass index (BMI) 35.0-35.9, adult: Secondary | ICD-10-CM

## 2020-07-09 DIAGNOSIS — Z308 Encounter for other contraceptive management: Secondary | ICD-10-CM | POA: Diagnosis not present

## 2020-07-09 LAB — POCT URINE PREGNANCY: Preg Test, Ur: NEGATIVE

## 2020-07-09 MED ORDER — PHENTERMINE HCL 37.5 MG PO TABS: 37.5000 mg | ORAL_TABLET | Freq: Every day | ORAL | 1 refills | Status: DC

## 2020-07-09 MED ORDER — NORGESTIMATE-ETH ESTRADIOL 0.25-35 MG-MCG PO TABS: 1.0000 | ORAL_TABLET | Freq: Every day | ORAL | 5 refills | Status: DC

## 2020-07-09 NOTE — Progress Notes (Signed)
Date:  07/09/2020   Name:  Christine Arellano   DOB:  Nov 02, 1990   MRN:  [id number]   Chief Complaint: Contraception (Was getting from OBG/ provider she was seeing is no longer there, has been without birth control 3 months )  Migraine  This is a recurrent problem. The problem occurs monthly. The pain quality is similar to prior headaches. The pain is mild. Pertinent negatives include no dizziness or fever. Treatments tried: started topamax and headaches have not recurred.   Contraception - out of OCPs for three months.  She does not desire additional pregnancies at this time.  She mainly used OCP to control bleeding but in case she does have intercourse she wants protection.    Weight issues - has been on phentermine in the past and did well.  However, several pregnancies intervened.  She can not seem to get her appetite controlled. She has tried NOOM without benefit.  She does have plans to start more regular exercise at her in-laws house - there is a treadmill and weights to use, as well as walking trails.  Lab Results  Component Value Date   CREATININE 0.47 (L) 05/31/2018   BUN 8 05/31/2018   NA 139 05/31/2018   K 4.2 05/31/2018   CL 103 05/31/2018   CO2 19 (L) 05/31/2018   No results found for: CHOL, HDL, LDLCALC, LDLDIRECT, TRIG, CHOLHDL No results found for: TSH No results found for: HGBA1C Lab Results  Component Value Date   WBC 6.3 11/06/2018   HGB 13.4 11/06/2018   HCT 42.0 11/06/2018   MCV 88 11/06/2018   PLT 278 11/06/2018   Lab Results  Component Value Date   ALT 8 05/31/2018   AST 11 05/31/2018   ALKPHOS 40 05/31/2018   BILITOT <0.2 05/31/2018     Review of Systems  Constitutional: Negative for chills, fatigue, fever and unexpected weight change.  Respiratory: Negative for chest tightness and shortness of breath.   Cardiovascular: Negative for chest pain and leg swelling.  Genitourinary: Positive for menstrual problem.  Neurological: Positive for  headaches. Negative for dizziness, syncope and light-headedness.  Psychiatric/Behavioral: Positive for dysphoric mood. Negative for sleep disturbance. The patient is not nervous/anxious.     Patient Active Problem List   Diagnosis Date Noted  . Inverted nipple 05/05/2019  . Red eye 05/05/2019  . Moderate episode of recurrent major depressive disorder ([place]) 05/05/2019  . BMI 28.0-28.9,adult 05/05/2019  . 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  . Generalized anxiety disorder 03/04/2019  . Alopecia of scalp 03/04/2019  . normal karyotype after Abnormal chromosomal and genetic finding on antenatal screening mother   . GBS bacteriuria 05/22/2018  . Dry eye syndrome of both eyes 10/18/2017  . Adult acne 05/03/2015  . S/P cesarean section 02/24/2015    No Known Allergies  Past Surgical History:  Procedure Laterality Date  . arthroscopic knee Right 2008  . CESAREAN SECTION  2014  . CESAREAN SECTION N/A 02/24/2015   Procedure: CESAREAN SECTION;  Surgeon: Rubie Maid, MD;  Location: [facility] ORS;  Service: Obstetrics;  Laterality: N/A;  . CESAREAN SECTION N/A 05/09/2017   Procedure: REPEAT CESAREAN SECTION;  Surgeon: Rubie Maid, MD;  Location: [facility] ORS;  Service: Obstetrics;  Laterality: N/A;  . HEMORROIDECTOMY    . THERAPEUTIC ABORTION  08/2018   fetal CNS abnormalities  . TONSILLECTOMY    . WISDOM TOOTH EXTRACTION      Social History   Tobacco Use  . Smoking status: Former Smoker  Packs/day: 1.00    Types: Cigarettes    Quit date: 08/27/2009    Years since quitting: 10.8  . Smokeless tobacco: Never Used  Vaping Use  . Vaping Use: Never used  Substance Use Topics  . Alcohol use: Not Currently    Alcohol/week: 0.0 standard drinks  . Drug use: No     Medication list has been reviewed and updated.  Current Meds  Medication Sig  . ALPRAZolam (XANAX) 0.25 MG tablet Take 0.5 tablets (0.125 mg total) by mouth 2 (two) times daily as needed for anxiety.  . cyclobenzaprine (FLEXERIL) 10  MG tablet TAKE 1 TABLET BY MOUTH EVERYDAY AT BEDTIME (Patient taking differently: as needed.)  . fluticasone (FLONASE) 50 MCG/ACT nasal spray SPRAY 2 SPRAYS INTO EACH NOSTRIL EVERY DAY (Patient taking differently: as needed.)  . ibuprofen (ADVIL) 800 MG tablet TAKE 1 TABLET BY MOUTH EVERY 8 HOURS AS NEEDED  . norgestimate-ethinyl estradiol (SPRINTEC 28) 0.25-35 MG-MCG tablet Take 1 tablet by mouth daily.  . SUMAtriptan (IMITREX) 50 MG tablet Take 1 tablet (50 mg total) by mouth every 2 (two) hours as needed for migraine. May repeat in 2 hours if headache persists or recurs.  . topiramate (TOPAMAX) 25 MG tablet Take 3 tablets (75 mg total) by mouth at bedtime. (Patient taking differently: Take 50 mg by mouth at bedtime. Takes 2 tablets)  . traZODone (DESYREL) 50 MG tablet TAKE 1 TABLET (50 MG TOTAL) BY MOUTH AT BEDTIME AS NEEDED FOR SLEEP.  . Vitamin D, Ergocalciferol, (DRISDOL) 1.25 MG (50000 UNIT) CAPS capsule Take 50,000 Units by mouth once a week.  . vortioxetine HBr (TRINTELLIX) 10 MG TABS tablet Take 1 tablet (10 mg total) by mouth daily.  . [DISCONTINUED] triamcinolone cream (KENALOG) 0.1 % Apply 1 application topically 2 (two) times daily.    PHQ 2/9 Scores 07/09/2020 06/07/2020 03/01/2020 07/09/2019  PHQ - 2 Score 2 4 6 4   PHQ- 9 Score 12 15 21 11     GAD 7 : Generalized Anxiety Score 07/09/2020 06/07/2020 03/01/2020 05/05/2019  Nervous, Anxious, on Edge 2 2 3 2   Control/stop worrying 2 2 3 3   Worry too much - different things 2 2 3 3   Trouble relaxing 0 1 3 1   Restless 0 1 3 2   Easily annoyed or irritable 3 2 3 3   Afraid - awful might happen 0 1 3 1   Total GAD 7 Score 9 11 21 15   Anxiety Difficulty - Somewhat difficult Very difficult Very difficult    BP Readings from Last 3 Encounters:  07/09/20 108/68  06/07/20 136/72  03/01/20 118/70    Physical Exam Vitals and nursing note reviewed.  Constitutional:      General: She is not in acute distress.    Appearance: She is  well-developed.  HENT:     Head: Normocephalic and atraumatic.  Cardiovascular:     Rate and Rhythm: Normal rate and regular rhythm.     Pulses: Normal pulses.     Heart sounds: No murmur heard.   Pulmonary:     Effort: Pulmonary effort is normal. No respiratory distress.     Breath sounds: No wheezing or rhonchi.  Musculoskeletal:        General: Normal range of motion.     Cervical back: Normal range of motion.  Lymphadenopathy:     Cervical: No cervical adenopathy.  Skin:    General: Skin is warm and dry.     Findings: No rash.  Neurological:  Mental Status: She is alert and oriented to person, place, and time.  Psychiatric:        Mood and Affect: Mood and affect and mood normal.        Behavior: Behavior normal.     Wt Readings from Last 3 Encounters:  07/09/20 196 lb (88.9 kg)  06/07/20 193 lb (87.5 kg)  03/01/20 179 lb (81.2 kg)    BP 108/68   Pulse 100   Temp 98.1 F (36.7 C) (Oral)   Ht 5\' 2"  (1.575 m)   Wt 196 lb (88.9 kg)   LMP 06/26/2020   SpO2 98%   BMI 35.85 kg/m   Assessment and Plan: 1. Encounter for other contraceptive management Begin OCPs this this weekend Follow up in April for CPX and Pap - norgestimate-ethinyl estradiol (Lib[place] Negron Torres 28) 0.25-35 MG-MCG tablet; Take 1 tablet by mouth daily.  Dispense: 28 tablet; Refill: 5 - POCT urine pregnancy  2. BMI 35.0-35.9,adult Begin regular exercise Phentermine for 2 months with weight loss goal of at least 5 lbs - phentermine (ADIPEX-P) 37.5 MG tablet; Take 1 tablet (37.5 mg total) by mouth daily before breakfast.  Dispense: 30 tablet; Refill: 1  3. Migraine with aura and without status migrainosus, not intractable Improved with addition of Topamax - now on 50 mg nightly Will continue current therapy   Partially dictated using Editor, commissioning. Any errors are unintentional.  [name], MD [facility]  07/09/2020

## 2020-08-12 ENCOUNTER — Other Ambulatory Visit: Payer: Self-pay | Admitting: Internal Medicine

## 2020-08-12 ENCOUNTER — Ambulatory Visit: Payer: Medicaid Other | Admitting: Internal Medicine

## 2020-08-12 DIAGNOSIS — F411 Generalized anxiety disorder: Secondary | ICD-10-CM

## 2020-08-12 NOTE — Telephone Encounter (Signed)
Requested medication (s) are due for refill today: Yes  Requested medication (s) are on the active medication list: Yes  Last refill:  03/01/20  Future visit scheduled: Yes  Notes to clinic:  See request.    Requested Prescriptions  Pending Prescriptions Disp Refills   ALPRAZolam (XANAX) 0.25 MG tablet [Pharmacy Med Name: ALPRAZOLAM 0.25 MG TABLET] 20 tablet 0    Sig: Take 0.5 tablets (0.125 mg total) by mouth 2 (two) times daily as needed for anxiety.      Not Delegated - Psychiatry:  Anxiolytics/Hypnotics Failed - 08/12/2020 10:03 AM      Failed - This refill cannot be delegated      Failed - Urine Drug Screen completed in last 360 days      Passed - Valid encounter within last 6 months    Recent Outpatient Visits           1 month ago Encounter for other contraceptive management   [facility] Glean Hess, MD   2 months ago Migraine with aura and without status migrainosus, not intractable   [facility] Glean Hess, MD   5 months ago Chronic nasal congestion   [facility] Glean Hess, MD   1 year ago Moderate episode of recurrent major depressive disorder [facility])   [facility] Glean Hess, MD   1 year ago Moderate episode of recurrent major depressive disorder [facility])   [facility] Glean Hess, MD       Future Appointments             In 1 month Army Melia, Jesse Sans, MD [facility], [facility]

## 2020-08-12 NOTE — Telephone Encounter (Signed)
Please review. Last office visit 07/09/2020.  KP

## 2020-08-25 ENCOUNTER — Encounter: Payer: Self-pay | Admitting: Internal Medicine

## 2020-08-25 ENCOUNTER — Ambulatory Visit: Payer: Medicaid Other | Admitting: Internal Medicine

## 2020-08-25 ENCOUNTER — Other Ambulatory Visit: Payer: Self-pay

## 2020-08-25 VITALS — BP 118/76 | HR 92 | Temp 98.2°F | Ht 62.0 in | Wt 177.0 lb

## 2020-08-25 DIAGNOSIS — N3001 Acute cystitis with hematuria: Secondary | ICD-10-CM

## 2020-08-25 DIAGNOSIS — Z6832 Body mass index (BMI) 32.0-32.9, adult: Secondary | ICD-10-CM

## 2020-08-25 LAB — POC URINALYSIS WITH MICROSCOPIC (NON AUTO)MANUAL RESULT
Bilirubin, UA: NEGATIVE
Crystals: 0
Epithelial cells, urine per micros: 2
Glucose, UA: NEGATIVE
Ketones, UA: NEGATIVE
Mucus, UA: 0
Nitrite, UA: POSITIVE
Protein, UA: POSITIVE — AB
RBC: 0 M/uL — AB (ref 4.04–5.48)
Spec Grav, UA: 1.01 (ref 1.010–1.025)
Urobilinogen, UA: 0.2 E.U./dL
WBC Casts, UA: 15
pH, UA: 5 (ref 5.0–8.0)

## 2020-08-25 MED ORDER — NITROFURANTOIN MONOHYD MACRO 100 MG PO CAPS
100.0000 mg | ORAL_CAPSULE | Freq: Two times a day (BID) | ORAL | 0 refills | Status: AC
Start: 2020-08-25 — End: 2020-09-01

## 2020-08-25 MED ORDER — PHENTERMINE HCL 37.5 MG PO TABS
37.5000 mg | ORAL_TABLET | Freq: Every day | ORAL | 1 refills | Status: DC
Start: 2020-09-03 — End: 2021-01-26

## 2020-08-25 NOTE — Progress Notes (Signed)
Date:  08/25/2020   Name:  Christine Arellano   DOB:  January 10, 1991   MRN:  [id number]   Chief Complaint: Urinary Tract Infection (X3 days, burning after urinating , a little blood in urine , foul odor)  Urinary Tract Infection  This is a new problem. The current episode started in the past 7 days. The problem occurs every urination. The problem has been gradually worsening. The quality of the pain is described as burning. The pain is mild. There has been no fever. Associated symptoms include frequency, hematuria and urgency. Pertinent negatives include no chills, flank pain, nausea or vomiting. She has tried increased fluids for the symptoms. The treatment provided no relief.   Weight loss - now on phentermine and doing well.  She has lost 19 lbs in 6 weeks. She is limiting her fat and calories and is more active working Visual merchandiser.  Lab Results  Component Value Date   CREATININE 0.47 (L) 05/31/2018   BUN 8 05/31/2018   NA 139 05/31/2018   K 4.2 05/31/2018   CL 103 05/31/2018   CO2 19 (L) 05/31/2018   No results found for: CHOL, HDL, LDLCALC, LDLDIRECT, TRIG, CHOLHDL No results found for: TSH No results found for: HGBA1C Lab Results  Component Value Date   WBC 6.3 11/06/2018   HGB 13.4 11/06/2018   HCT 42.0 11/06/2018   MCV 88 11/06/2018   PLT 278 11/06/2018   Lab Results  Component Value Date   ALT 8 05/31/2018   AST 11 05/31/2018   ALKPHOS 40 05/31/2018   BILITOT <0.2 05/31/2018     Review of Systems  Constitutional: Negative for chills, fatigue, fever and unexpected weight change (has lost 20 lbs ).  Respiratory: Negative for chest tightness and shortness of breath.   Cardiovascular: Negative for chest pain.  Gastrointestinal: Negative for nausea and vomiting.  Genitourinary: Positive for frequency, hematuria and urgency. Negative for flank pain.  Neurological: Negative for dizziness and headaches.    Patient Active Problem List   Diagnosis Date Noted  . Inverted  nipple 05/05/2019  . Red eye 05/05/2019  . Moderate episode of recurrent major depressive disorder ([place]) 05/05/2019  . 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  . Generalized anxiety disorder 03/04/2019  . Alopecia of scalp 03/04/2019  . normal karyotype after Abnormal chromosomal and genetic finding on antenatal screening mother   . GBS bacteriuria 05/22/2018  . Dry eye syndrome of both eyes 10/18/2017  . Adult acne 05/03/2015  . S/P cesarean section 02/24/2015    No Known Allergies  Past Surgical History:  Procedure Laterality Date  . arthroscopic knee Right 2008  . CESAREAN SECTION  2014  . CESAREAN SECTION N/A 02/24/2015   Procedure: CESAREAN SECTION;  Surgeon: Rubie Maid, MD;  Location: [facility] ORS;  Service: Obstetrics;  Laterality: N/A;  . CESAREAN SECTION N/A 05/09/2017   Procedure: REPEAT CESAREAN SECTION;  Surgeon: Rubie Maid, MD;  Location: [facility] ORS;  Service: Obstetrics;  Laterality: N/A;  . HEMORROIDECTOMY    . THERAPEUTIC ABORTION  08/2018   fetal CNS abnormalities  . TONSILLECTOMY    . WISDOM TOOTH EXTRACTION      Social History   Tobacco Use  . Smoking status: Former Smoker    Packs/day: 1.00    Types: Cigarettes    Quit date: 08/27/2009    Years since quitting: 11.0  . Smokeless tobacco: Never Used  Vaping Use  . Vaping Use: Never used  Substance Use Topics  . Alcohol use:  Not Currently    Alcohol/week: 0.0 standard drinks  . Drug use: No     Medication list has been reviewed and updated.  Current Meds  Medication Sig  . ALPRAZolam (XANAX) 0.25 MG tablet TAKE 0.5 TABLETS (0.125 MG TOTAL) BY MOUTH 2 (TWO) TIMES DAILY AS NEEDED FOR ANXIETY.  . cyclobenzaprine (FLEXERIL) 10 MG tablet TAKE 1 TABLET BY MOUTH EVERYDAY AT BEDTIME (Patient taking differently: as needed.)  . fluticasone (FLONASE) 50 MCG/ACT nasal spray SPRAY 2 SPRAYS INTO EACH NOSTRIL EVERY DAY (Patient taking differently: as needed.)  . ibuprofen (ADVIL) 800 MG tablet TAKE 1 TABLET BY MOUTH EVERY 8  HOURS AS NEEDED  . norgestimate-ethinyl estradiol (SPRINTEC 28) 0.25-35 MG-MCG tablet Take 1 tablet by mouth daily.  . phentermine (ADIPEX-P) 37.5 MG tablet Take 1 tablet (37.5 mg total) by mouth daily before breakfast.  . SUMAtriptan (IMITREX) 50 MG tablet Take 1 tablet (50 mg total) by mouth every 2 (two) hours as needed for migraine. May repeat in 2 hours if headache persists or recurs.  . topiramate (TOPAMAX) 25 MG tablet Take 3 tablets (75 mg total) by mouth at bedtime. (Patient taking differently: Take 75 mg by mouth as needed.)  . traZODone (DESYREL) 50 MG tablet TAKE 1 TABLET (50 MG TOTAL) BY MOUTH AT BEDTIME AS NEEDED FOR SLEEP.  . Vitamin D, Ergocalciferol, (DRISDOL) 1.25 MG (50000 UNIT) CAPS capsule Take 50,000 Units by mouth once a week.  . vortioxetine HBr (TRINTELLIX) 10 MG TABS tablet Take 1 tablet (10 mg total) by mouth daily.    PHQ 2/9 Scores 08/25/2020 07/09/2020 06/07/2020 03/01/2020  PHQ - 2 Score 2 2 4 6   PHQ- 9 Score 5 12 15 21     GAD 7 : Generalized Anxiety Score 08/25/2020 07/09/2020 06/07/2020 03/01/2020  Nervous, Anxious, on Edge 1 2 2 3   Control/stop worrying 1 2 2 3   Worry too much - different things 1 2 2 3   Trouble relaxing 0 0 1 3  Restless 0 0 1 3  Easily annoyed or irritable 1 3 2 3   Afraid - awful might happen 1 0 1 3  Total GAD 7 Score 5 9 11 21   Anxiety Difficulty - - Somewhat difficult Very difficult    BP Readings from Last 3 Encounters:  08/25/20 118/76  07/09/20 108/68  06/07/20 136/72    Physical Exam Vitals and nursing note reviewed.  Constitutional:      Appearance: Normal appearance. She is well-developed.  Cardiovascular:     Rate and Rhythm: Normal rate and regular rhythm.     Pulses: Normal pulses.     Heart sounds: Normal heart sounds.  Pulmonary:     Effort: Pulmonary effort is normal. No respiratory distress.     Breath sounds: Normal breath sounds.  Abdominal:     General: Bowel sounds are normal.     Palpations: Abdomen is  soft.     Tenderness: There is abdominal tenderness in the suprapubic area. There is no guarding or rebound.  Neurological:     Mental Status: She is alert.     Wt Readings from Last 3 Encounters:  08/25/20 177 lb (80.3 kg)  07/09/20 196 lb (88.9 kg)  06/07/20 193 lb (87.5 kg)    BP 118/76   Pulse 92   Temp 98.2 F (36.8 C) (Oral)   Ht 5\' 2"  (1.575 m)   Wt 177 lb (80.3 kg)   LMP 07/31/2020 (Approximate)   SpO2 99%   BMI 32.37 kg/m  Assessment and Plan: 1. Acute cystitis with hematuria Increase fluids, treat with Macrobid and adjust per cx results - Urine Culture - POC urinalysis w microscopic (non auto) - nitrofurantoin, macrocrystal-monohydrate, (MACROBID) 100 MG capsule; Take 1 capsule (100 mg total) by mouth 2 (two) times daily for 7 days.  Dispense: 14 capsule; Refill: 0  2. BMI 32.0-32.9,adult Doing well with diet and phentermine - will refill for another 2 months BP and HR remain normal. - phentermine (ADIPEX-P) 37.5 MG tablet; Take 1 tablet (37.5 mg total) by mouth daily before breakfast.  Dispense: 30 tablet; Refill: 1    Partially dictated using Editor, commissioning. Any errors are unintentional.  [name], MD [facility]  08/25/2020

## 2020-08-28 LAB — URINE CULTURE

## 2020-09-14 ENCOUNTER — Encounter: Payer: Self-pay | Admitting: Internal Medicine

## 2020-09-28 ENCOUNTER — Other Ambulatory Visit: Payer: Self-pay | Admitting: Internal Medicine

## 2020-09-28 DIAGNOSIS — M546 Pain in thoracic spine: Secondary | ICD-10-CM

## 2020-09-28 NOTE — Telephone Encounter (Signed)
Requested medication (s) are due for refill today: yes  Requested medication (s) are on the active medication list: yes    Last refill: 04/26/20  #30  0 refills  Future visit scheduled   yes 01/26/21  Notes to clinic: not delegated  Requested Prescriptions  Pending Prescriptions Disp Refills   cyclobenzaprine (FLEXERIL) 10 MG tablet [Pharmacy Med Name: CYCLOBENZAPRINE 10 MG TABLET] 30 tablet 0    Sig: TAKE 1 TABLET BY MOUTH EVERYDAY AT BEDTIME      Not Delegated - Analgesics:  Muscle Relaxants Failed - 09/28/2020  1:06 PM      Failed - This refill cannot be delegated      Passed - Valid encounter within last 6 months    Recent Outpatient Visits           1 month ago Acute cystitis with hematuria   [facility] Glean Hess, MD   2 months ago Encounter for other contraceptive management   [facility], [facility]   3 months ago Migraine with aura and without status migrainosus, not intractable   [facility] Glean Hess, MD   7 months ago Chronic nasal congestion   [facility] Glean Hess, MD   1 year ago Moderate episode of recurrent major depressive disorder [facility])   [facility] Glean Hess, MD       Future Appointments             In 4 months Army Melia Jesse Sans, MD [facility], [facility]

## 2020-10-14 ENCOUNTER — Ambulatory Visit: Payer: Self-pay | Admitting: *Deleted

## 2020-10-14 NOTE — Telephone Encounter (Signed)
Widespread hives last night. Large, round raised and itches.  Took Benadryl this am now hives only below the knees. Denies SOB/tongue swelling/difficulty swallowing. No new exposure. This has occurred before from stress or anxiety. Care Advice-She will take Xanax now and relax/talk out what may be triggering the anxiety. Increase water intake today and tomorrow.Aveeno oatmeal bath for comfort. Continue prescription cream and antihistamine-careful not to take with xanax. With any difficulty swallowing, drooling, hive on the face/eyes, seek treatment immediately at [facility]. Has appointment 10/18/20.  Reason for Disposition . Widespread hives  Answer Assessment - Initial Assessment Questions 1. APPEARANCE: "What does the rash look like?"     Large, raised hives 2. LOCATION: "Where is the rash located?"      Lower legs 3. NUMBER: "How many hives are there?"     Few this morning 4. SIZE: "How big are the hives?" (inches, cm, compare to coins) "Do they all look the same or is there lots of variation in shape and size?"       5. ONSET: "When did the hives begin?" (Hours or days ago)      yesterday 6. ITCHING: "Does it itch?" If Yes, ask: "How bad is the itch?"    - MILD: doesn't interfere with normal activities   - MODERATE-SEVERE: interferes with work, school, sleep, or other activities   itch 7. RECURRENT PROBLEM: "Have you had hives before?" If Yes, ask: "When was the last time?" and "What happened that time?"      Yes with anxiety 8. TRIGGERS: "Were you exposed to any new food, plant, cosmetic product or animal just before the hives began?"     none 9. OTHER SYMPTOMS: "Do you have any other symptoms?" (e.g., fever, tongue swelling, difficulty breathing, abdominal pain)     *No Answer* 10. PREGNANCY: "Is there any chance you are pregnant?" "When was your last menstrual period?"       *No Answer*  Protocols used: HIVES-A-AH

## 2020-11-04 DIAGNOSIS — F429 Obsessive-compulsive disorder, unspecified: Secondary | ICD-10-CM | POA: Diagnosis not present

## 2020-11-04 DIAGNOSIS — Z79899 Other long term (current) drug therapy: Secondary | ICD-10-CM | POA: Diagnosis not present

## 2020-11-04 DIAGNOSIS — F4522 Body dysmorphic disorder: Secondary | ICD-10-CM | POA: Diagnosis not present

## 2020-11-04 DIAGNOSIS — F339 Major depressive disorder, recurrent, unspecified: Secondary | ICD-10-CM | POA: Diagnosis not present

## 2020-11-04 DIAGNOSIS — Z1389 Encounter for screening for other disorder: Secondary | ICD-10-CM | POA: Diagnosis not present

## 2020-11-08 ENCOUNTER — Telehealth: Payer: Self-pay | Admitting: Internal Medicine

## 2020-11-08 NOTE — Telephone Encounter (Signed)
Pt is calling for a list of prior medication. Pt is requesting if the list can be submitted to her MyChart account. Please advise Cb- [phone]

## 2020-11-09 NOTE — Telephone Encounter (Signed)
Sent patient a my chart message about this. Unsure of what specific medicines she is looking for.

## 2020-12-05 ENCOUNTER — Other Ambulatory Visit: Payer: Self-pay | Admitting: Internal Medicine

## 2020-12-05 DIAGNOSIS — M546 Pain in thoracic spine: Secondary | ICD-10-CM

## 2020-12-05 NOTE — Telephone Encounter (Signed)
Future visit in 1 month  

## 2020-12-05 NOTE — Telephone Encounter (Signed)
Requested medication (s) are due for refill today: yes  Requested medication (s) are on the active medication list: yes  Last refill:  09/28/20 #30 0 refills  Future visit scheduled: yes in 9 months   Notes to clinic:  not delegated per protocol     Requested Prescriptions  Pending Prescriptions Disp Refills   cyclobenzaprine (FLEXERIL) 10 MG tablet [Pharmacy Med Name: CYCLOBENZAPRINE 10 MG TABLET] 30 tablet 0    Sig: TAKE 1 TABLET BY MOUTH EVERYDAY AT BEDTIME AS NEEDED      Not Delegated - Analgesics:  Muscle Relaxants Failed - 12/05/2020  1:35 PM      Failed - This refill cannot be delegated      Passed - Valid encounter within last 6 months    Recent Outpatient Visits           3 months ago Acute cystitis with hematuria   [facility] Glean Hess, MD   4 months ago Encounter for other contraceptive management   [facility] Glean Hess, MD   6 months ago Migraine with aura and without status migrainosus, not intractable   [facility] Glean [facility], [facility]   9 months ago Chronic nasal congestion   [facility] Glean [facility], MD   1 year ago Moderate episode of recurrent major depressive disorder [facility])   [facility] Glean Hess, MD       Future Appointments             In 1 month Army Melia Jesse Sans, MD [facility], [facility]              Signed Prescriptions Disp Refills   ibuprofen (ADVIL) 800 MG tablet 60 tablet 0    Sig: TAKE 1 TABLET BY MOUTH EVERY 8 HOURS AS NEEDED      Analgesics:  NSAIDS Failed - 12/05/2020  1:35 PM      Failed - Cr in normal range and within 360 days    Creatinine, Ser  Date Value Ref Range Status  05/31/2018 0.47 (L) 0.57 - 1.00 mg/dL Final          Failed - HGB in normal range and within 360 days    Hemoglobin  Date Value Ref Range Status  11/06/2018 13.4 11.1 - 15.9 g/dL Final  07/21/2014 13.8 g/dL Final          Passed - Patient is not pregnant       Passed - Valid encounter within last 12 months    Recent Outpatient Visits           3 months ago Acute cystitis with hematuria   [facility] Glean Hess, MD   4 months ago Encounter for other contraceptive management   [facility] Glean Hess, MD   6 months ago Migraine with aura and without status migrainosus, not intractable   [facility] Glean Hess, MD   9 months ago Chronic nasal congestion   [facility] Glean Hess, MD   1 year ago Moderate episode of recurrent major depressive disorder [facility])   [facility] Glean Hess, MD       Future Appointments             In 1 month Army Melia, Jesse Sans, MD [facility], [facility]

## 2020-12-09 ENCOUNTER — Other Ambulatory Visit: Payer: Self-pay | Admitting: Internal Medicine

## 2020-12-09 DIAGNOSIS — Z5181 Encounter for therapeutic drug level monitoring: Secondary | ICD-10-CM | POA: Diagnosis not present

## 2020-12-09 DIAGNOSIS — F332 Major depressive disorder, recurrent severe without psychotic features: Secondary | ICD-10-CM | POA: Diagnosis not present

## 2020-12-09 DIAGNOSIS — Z308 Encounter for other contraceptive management: Secondary | ICD-10-CM

## 2020-12-09 DIAGNOSIS — F4522 Body dysmorphic disorder: Secondary | ICD-10-CM | POA: Diagnosis not present

## 2020-12-09 DIAGNOSIS — F339 Major depressive disorder, recurrent, unspecified: Secondary | ICD-10-CM | POA: Diagnosis not present

## 2020-12-09 DIAGNOSIS — F429 Obsessive-compulsive disorder, unspecified: Secondary | ICD-10-CM | POA: Diagnosis not present

## 2020-12-09 NOTE — Telephone Encounter (Signed)
Future visit in 1 month  

## 2020-12-25 ENCOUNTER — Other Ambulatory Visit: Payer: Self-pay | Admitting: Internal Medicine

## 2020-12-25 NOTE — Telephone Encounter (Signed)
Requested medication (s) are due for refill today: yes  Requested medication (s) are on the active medication list: yes  Last refill:  12/05/20 #60  Future visit scheduled: yes  Notes to clinic:  overdue lab (Cr and Hgb)   Requested Prescriptions  Pending Prescriptions Disp Refills   ibuprofen (ADVIL) 800 MG tablet [Pharmacy Med Name: IBUPROFEN 800 MG TABLET] 60 tablet 0    Sig: TAKE 1 TABLET BY MOUTH EVERY 8 HOURS AS NEEDED      Analgesics:  NSAIDS Failed - 12/25/2020 11:24 AM      Failed - Cr in normal range and within 360 days    Creatinine, Ser  Date Value Ref Range Status  05/31/2018 0.47 (L) 0.57 - 1.00 mg/dL Final          Failed - HGB in normal range and within 360 days    Hemoglobin  Date Value Ref Range Status  11/06/2018 13.4 11.1 - 15.9 g/dL Final  07/21/2014 13.8 g/dL Final          Passed - Patient is not pregnant      Passed - Valid encounter within last 12 months    Recent Outpatient Visits           4 months ago Acute cystitis with hematuria   [facility] Glean Hess, MD   5 months ago Encounter for other contraceptive management   [facility] Glean Hess, MD   6 months ago Migraine with aura and without status migrainosus, not intractable   [facility] Glean Hess, MD   9 months ago Chronic nasal congestion   [facility] Glean [facility], MD   1 year ago Moderate episode of recurrent major depressive disorder [facility])   [facility] Glean Hess, MD       Future Appointments             In 1 month Army Melia, Jesse Sans, MD [facility], [facility]

## 2020-12-29 ENCOUNTER — Other Ambulatory Visit: Payer: Self-pay

## 2020-12-29 ENCOUNTER — Telehealth: Payer: Self-pay

## 2020-12-29 DIAGNOSIS — L659 Nonscarring hair loss, unspecified: Secondary | ICD-10-CM

## 2020-12-29 DIAGNOSIS — Z7689 Persons encountering health services in other specified circumstances: Secondary | ICD-10-CM | POA: Diagnosis not present

## 2020-12-29 DIAGNOSIS — L700 Acne vulgaris: Secondary | ICD-10-CM | POA: Diagnosis not present

## 2020-12-29 DIAGNOSIS — L650 Telogen effluvium: Secondary | ICD-10-CM | POA: Diagnosis not present

## 2020-12-29 DIAGNOSIS — Z79899 Other long term (current) drug therapy: Secondary | ICD-10-CM | POA: Diagnosis not present

## 2020-12-29 DIAGNOSIS — L738 Other specified follicular disorders: Secondary | ICD-10-CM | POA: Diagnosis not present

## 2020-12-29 NOTE — Telephone Encounter (Signed)
Called pt informed her that a referral was placed. Pt verbalized understanding.  KP

## 2020-12-29 NOTE — Telephone Encounter (Signed)
Copied from [place] [id number]. Topic: General - Other >> Dec 29, 2020  3:02 PM Leward Quan A wrote: Reason for CRM: Patient called in to inform Dr Army Melia that she need a referral so that she can continue seeing the Dermatologist. Please call for further information  Ph# [phone]

## 2021-01-06 DIAGNOSIS — F332 Major depressive disorder, recurrent severe without psychotic features: Secondary | ICD-10-CM | POA: Diagnosis not present

## 2021-01-06 DIAGNOSIS — F429 Obsessive-compulsive disorder, unspecified: Secondary | ICD-10-CM | POA: Diagnosis not present

## 2021-01-09 ENCOUNTER — Other Ambulatory Visit: Payer: Self-pay | Admitting: Internal Medicine

## 2021-01-09 DIAGNOSIS — F331 Major depressive disorder, recurrent, moderate: Secondary | ICD-10-CM

## 2021-01-09 DIAGNOSIS — F5101 Primary insomnia: Secondary | ICD-10-CM

## 2021-01-09 NOTE — Telephone Encounter (Signed)
30 day courtesy refill- pt has appt upcoming Requested Prescriptions  Pending Prescriptions Disp Refills  . traZODone (DESYREL) 50 MG tablet [Pharmacy Med Name: TRAZODONE 50 MG TABLET] 30 tablet 0    Sig: TAKE 1 TABLET BY MOUTH AT BEDTIME AS NEEDED FOR SLEEP.     Psychiatry: Antidepressants - Serotonin Modulator Passed - 01/09/2021  8:32 AM      Passed - Completed PHQ-2 or PHQ-9 in the last 360 days      Passed - Valid encounter within last 6 months    Recent Outpatient Visits          4 months ago Acute cystitis with hematuria   [facility] Glean Hess, MD   6 months ago Encounter for other contraceptive management   [facility] Glean [facility], [facility]   7 months ago Migraine with aura and without status migrainosus, not intractable   [facility] Glean Hess, MD   10 months ago Chronic nasal congestion   [facility], MD   1 year ago Moderate episode of recurrent major depressive disorder [facility])   [facility] Glean Hess, MD      Future Appointments            In 2 weeks Army Melia Jesse Sans, MD [facility] [place]           . TRINTELLIX 10 MG TABS tablet [Pharmacy Med Name: TRINTELLIX 10 MG TABLET] 30 tablet 0    Sig: TAKE 1 TABLET BY MOUTH EVERY DAY     Psychiatry: Antidepressants - Serotonin Modulator Passed - 01/09/2021  8:32 AM      Passed - Completed PHQ-2 or PHQ-9 in the last 360 days      Passed - Valid encounter within last 6 months    Recent Outpatient Visits          4 months ago Acute cystitis with hematuria   [facility] Glean Hess, MD   6 months ago Encounter for other contraceptive management   [facility] Glean Hess, MD   7 months ago Migraine with aura and without status migrainosus, not intractable   [facility] Glean Hess, MD   10 months ago Chronic nasal congestion   [facility] Glean Hess, MD    1 year ago Moderate episode of recurrent major depressive disorder [facility])   [facility] Glean Hess, MD      Future Appointments            In 2 weeks Army Melia Jesse Sans, MD [facility], [facility]

## 2021-01-10 ENCOUNTER — Other Ambulatory Visit: Payer: Self-pay | Admitting: Internal Medicine

## 2021-01-10 DIAGNOSIS — M546 Pain in thoracic spine: Secondary | ICD-10-CM

## 2021-01-10 NOTE — Telephone Encounter (Signed)
Requested medication (s) are due for refill today: yes  Requested medication (s) are on the active medication list: yes  Last refill: 12/06/20 #30  0 refills  Future visit scheduled  yes 01/26/21  Notes to clinic:  not delegated  Requested Prescriptions  Pending Prescriptions Disp Refills   cyclobenzaprine (FLEXERIL) 10 MG tablet [Pharmacy Med Name: CYCLOBENZAPRINE 10 MG TABLET] 30 tablet 0    Sig: TAKE 1 TABLET BY MOUTH EVERYDAY AT BEDTIME AS NEEDED     Not Delegated - Analgesics:  Muscle Relaxants Failed - 01/10/2021 11:54 AM      Failed - This refill cannot be delegated      Passed - Valid encounter within last 6 months    Recent Outpatient Visits           4 months ago Acute cystitis with hematuria   [facility] Glean Hess, MD   6 months ago Encounter for other contraceptive management   [facility] Glean Hess, MD   7 months ago Migraine with aura and without status migrainosus, not intractable   [facility] Glean Hess, MD   10 months ago Chronic nasal congestion   [facility] Glean Hess, MD   1 year ago Moderate episode of recurrent major depressive disorder [facility])   [facility] Glean Hess, MD       Future Appointments             In 2 weeks Army Melia Jesse Sans, MD [facility], [facility]

## 2021-01-26 ENCOUNTER — Encounter: Payer: Medicaid Other | Admitting: Internal Medicine

## 2021-01-26 ENCOUNTER — Encounter: Payer: Self-pay | Admitting: Internal Medicine

## 2021-01-26 DIAGNOSIS — G43109 Migraine with aura, not intractable, without status migrainosus: Secondary | ICD-10-CM | POA: Insufficient documentation

## 2021-01-26 DIAGNOSIS — G43009 Migraine without aura, not intractable, without status migrainosus: Secondary | ICD-10-CM | POA: Insufficient documentation

## 2021-01-26 NOTE — Progress Notes (Deleted)
Date:  01/26/2021   Name:  Christine Arellano   DOB:  09-07-1990   MRN:  [id number]   Chief Complaint: No chief complaint on file. Christine Arellano is a 30 y.o. female who presents today for her Complete Annual Exam. She feels {DESC; WELL/FAIRLY WELL/POORLY:18703}. She reports exercising ***. She reports she is sleeping {DESC; WELL/FAIRLY WELL/POORLY:18703}. Breast complaints ***.  Mammogram: not due Pap smear: 06/2016 neg Colonoscopy: not due  Immunization History  Administered Date(s) Administered   Influenza,inj,Quad PF,6+ Mos 02/05/2015, 06/06/2016, 03/04/2019, 03/01/2020   Tdap 12/03/2014, 02/22/2017    HPI  Lab Results  Component Value Date   CREATININE 0.47 (L) 05/31/2018   BUN 8 05/31/2018   NA 139 05/31/2018   K 4.2 05/31/2018   CL 103 05/31/2018   CO2 19 (L) 05/31/2018   No results found for: CHOL, HDL, LDLCALC, LDLDIRECT, TRIG, CHOLHDL No results found for: TSH No results found for: HGBA1C Lab Results  Component Value Date   WBC 6.3 11/06/2018   HGB 13.4 11/06/2018   HCT 42.0 11/06/2018   MCV 88 11/06/2018   PLT 278 11/06/2018   Lab Results  Component Value Date   ALT 8 05/31/2018   AST 11 05/31/2018   ALKPHOS 40 05/31/2018   BILITOT <0.2 05/31/2018     Review of Systems  Constitutional:  Negative for chills, fatigue and fever.  HENT:  Negative for congestion, hearing loss, tinnitus, trouble swallowing and voice change.   Eyes:  Negative for visual disturbance.  Respiratory:  Negative for cough, chest tightness, shortness of breath and wheezing.   Cardiovascular:  Negative for chest pain, palpitations and leg swelling.  Gastrointestinal:  Negative for abdominal pain, constipation, diarrhea and vomiting.  Endocrine: Negative for polydipsia and polyuria.  Genitourinary:  Negative for dysuria, frequency, genital sores, vaginal bleeding and vaginal discharge.  Musculoskeletal:  Negative for arthralgias, gait problem and joint swelling.  Skin:  Negative  for color change and rash.  Neurological:  Negative for dizziness, tremors, light-headedness and headaches.  Hematological:  Negative for adenopathy. Does not bruise/bleed easily.  Psychiatric/Behavioral:  Negative for dysphoric mood and sleep disturbance. The patient is not nervous/anxious.    Patient Active Problem List   Diagnosis Date Noted   Migraine headache without aura 01/26/2021   Inverted nipple 05/05/2019   Red eye 05/05/2019   Moderate episode of recurrent major depressive disorder ([place]) 05/05/2019   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   Generalized anxiety disorder 03/04/2019   Alopecia of scalp 03/04/2019   Dry eye syndrome of both eyes 10/18/2017   Adult acne 05/03/2015    No Known Allergies  Past Surgical History:  Procedure Laterality Date   arthroscopic knee Right 2008   CESAREAN SECTION  2014   CESAREAN SECTION N/A 02/24/2015   Procedure: CESAREAN SECTION;  Surgeon: Rubie Maid, MD;  Location: [facility] ORS;  Service: Obstetrics;  Laterality: N/A;   CESAREAN SECTION N/A 05/09/2017   Procedure: REPEAT CESAREAN SECTION;  Surgeon: Rubie Maid, MD;  Location: [facility] ORS;  Service: Obstetrics;  Laterality: N/A;   HEMORROIDECTOMY     THERAPEUTIC ABORTION  08/2018   fetal CNS abnormalities   TONSILLECTOMY     WISDOM TOOTH EXTRACTION      Social History   Tobacco Use   Smoking status: Former    Packs/day: 1.00    Types: Cigarettes    Quit date: 08/27/2009    Years since quitting: 11.4   Smokeless tobacco: Never  Vaping Use   Vaping  Use: Never used  Substance Use Topics   Alcohol use: Not Currently    Alcohol/week: 0.0 standard drinks   Drug use: No     Medication list has been reviewed and updated.  No outpatient medications have been marked as taking for the 01/26/21 encounter (Appointment) with Christine Hess, MD.    [facility] 2/9 Scores 08/25/2020 07/09/2020 06/07/2020 03/01/2020  PHQ - 2 Score '2 2 4 6  '$ PHQ- 9 Score '5 12 15 21    '$ GAD 7 : Generalized Anxiety Score  08/25/2020 07/09/2020 06/07/2020 03/01/2020  Nervous, Anxious, on Edge '1 2 2 3  '$ Control/stop worrying '1 2 2 3  '$ Worry too much - different things '1 2 2 3  '$ Trouble relaxing 0 0 1 3  Restless 0 0 1 3  Easily annoyed or irritable '1 3 2 3  '$ Afraid - awful might happen 1 0 1 3  Total GAD 7 Score '5 9 11 21  '$ Anxiety Difficulty - - Somewhat difficult Very difficult    BP Readings from Last 3 Encounters:  08/25/20 118/76  07/09/20 108/68  06/07/20 136/72    Physical Exam Vitals and nursing note reviewed.  Constitutional:      General: She is not in acute distress.    Appearance: She is well-developed.  HENT:     Head: Normocephalic and atraumatic.     Right Ear: Tympanic membrane and ear canal normal.     Left Ear: Tympanic membrane and ear canal normal.     Nose:     Right Sinus: No maxillary sinus tenderness.     Left Sinus: No maxillary sinus tenderness.  Eyes:     General: No scleral icterus.       Right eye: No discharge.        Left eye: No discharge.     Conjunctiva/sclera: Conjunctivae normal.  Neck:     Thyroid: No thyromegaly.     Vascular: No carotid bruit.  Cardiovascular:     Rate and Rhythm: Normal rate and regular rhythm.     Pulses: Normal pulses.     Heart sounds: Normal heart sounds.  Pulmonary:     Effort: Pulmonary effort is normal. No respiratory distress.     Breath sounds: No wheezing.  Chest:  Breasts:    Right: No mass, nipple discharge, skin change or tenderness.     Left: No mass, nipple discharge, skin change or tenderness.  Abdominal:     General: Bowel sounds are normal.     Palpations: Abdomen is soft.     Tenderness: There is no abdominal tenderness.  Musculoskeletal:     Cervical back: Normal range of motion. No erythema.     Right lower leg: No edema.     Left lower leg: No edema.  Lymphadenopathy:     Cervical: No cervical adenopathy.  Skin:    General: Skin is warm and dry.     Findings: No rash.  Neurological:     Mental Status: She  is alert and oriented to person, place, and time.     Cranial Nerves: No cranial nerve deficit.     Sensory: No sensory deficit.     Deep Tendon Reflexes: Reflexes are normal and symmetric.  Psychiatric:        Attention and Perception: Attention normal.        Mood and Affect: Mood normal.    Wt Readings from Last 3 Encounters:  08/25/20 177 lb (80.3 kg)  07/09/20 196 lb (  88.9 kg)  06/07/20 193 lb (87.5 kg)    There were no vitals taken for this visit.  Assessment and Plan:

## 2021-02-03 DIAGNOSIS — F332 Major depressive disorder, recurrent severe without psychotic features: Secondary | ICD-10-CM | POA: Diagnosis not present

## 2021-02-03 DIAGNOSIS — F429 Obsessive-compulsive disorder, unspecified: Secondary | ICD-10-CM | POA: Diagnosis not present

## 2021-02-09 DIAGNOSIS — L700 Acne vulgaris: Secondary | ICD-10-CM | POA: Diagnosis not present

## 2021-02-09 DIAGNOSIS — L738 Other specified follicular disorders: Secondary | ICD-10-CM | POA: Diagnosis not present

## 2021-02-09 DIAGNOSIS — L650 Telogen effluvium: Secondary | ICD-10-CM | POA: Diagnosis not present

## 2021-03-03 DIAGNOSIS — F332 Major depressive disorder, recurrent severe without psychotic features: Secondary | ICD-10-CM | POA: Diagnosis not present

## 2021-03-03 DIAGNOSIS — F429 Obsessive-compulsive disorder, unspecified: Secondary | ICD-10-CM | POA: Diagnosis not present

## 2021-03-17 ENCOUNTER — Telehealth: Payer: Self-pay | Admitting: Internal Medicine

## 2021-03-17 NOTE — Telephone Encounter (Signed)
Referral Request - Has patient seen PCP for this complaint? Yes(patient says needs new referral *If NO, is insurance requiring patient see PCP for this issue before PCP can refer them? Referral for which specialty: dermatologist Preferred provider/office: [facility] Reason for referral: acne

## 2021-03-18 ENCOUNTER — Other Ambulatory Visit: Payer: Self-pay

## 2021-03-18 DIAGNOSIS — L659 Nonscarring hair loss, unspecified: Secondary | ICD-10-CM

## 2021-03-18 DIAGNOSIS — L700 Acne vulgaris: Secondary | ICD-10-CM

## 2021-03-18 NOTE — Telephone Encounter (Signed)
Referral placed.

## 2021-03-28 ENCOUNTER — Other Ambulatory Visit: Payer: Self-pay | Admitting: Internal Medicine

## 2021-03-28 DIAGNOSIS — F331 Major depressive disorder, recurrent, moderate: Secondary | ICD-10-CM

## 2021-03-28 NOTE — Telephone Encounter (Signed)
Requested medications are due for refill today.  yes  Requested medications are on the active medications list.  yes  Last refill. 01/09/2021  Future visit scheduled.   yes  Notes to clinic.  Pharmacy needs Dx code.

## 2021-03-30 ENCOUNTER — Ambulatory Visit: Payer: Medicaid Other | Admitting: Internal Medicine

## 2021-03-30 ENCOUNTER — Telehealth: Payer: Self-pay

## 2021-03-30 ENCOUNTER — Other Ambulatory Visit: Payer: Self-pay

## 2021-03-30 ENCOUNTER — Encounter: Payer: Self-pay | Admitting: Internal Medicine

## 2021-03-30 VITALS — BP 124/70 | HR 88 | Ht 62.0 in | Wt 182.8 lb

## 2021-03-30 DIAGNOSIS — G8929 Other chronic pain: Secondary | ICD-10-CM | POA: Insufficient documentation

## 2021-03-30 DIAGNOSIS — F331 Major depressive disorder, recurrent, moderate: Secondary | ICD-10-CM

## 2021-03-30 DIAGNOSIS — M545 Low back pain, unspecified: Secondary | ICD-10-CM | POA: Insufficient documentation

## 2021-03-30 DIAGNOSIS — Z6833 Body mass index (BMI) 33.0-33.9, adult: Secondary | ICD-10-CM | POA: Insufficient documentation

## 2021-03-30 DIAGNOSIS — Z6841 Body Mass Index (BMI) 40.0 and over, adult: Secondary | ICD-10-CM | POA: Insufficient documentation

## 2021-03-30 MED ORDER — SEMAGLUTIDE-WEIGHT MANAGEMENT 0.25 MG/0.5ML ~~LOC~~ SOAJ: 0.2500 mg | SUBCUTANEOUS | 0 refills | Status: DC

## 2021-03-30 NOTE — Telephone Encounter (Signed)
Copied from [place] [id number]. Topic: General - Other >> Mar 30, 2021 10:48 AM Leward Quan A wrote: Reason for CRM: Patient called in to inform Dr Army Melia that Rx sent to pharmacy was not covered by her insurance. Patient will reach out to insurance and see if there is a similar medication that they will cover

## 2021-03-30 NOTE — Telephone Encounter (Signed)
Pt calling back to advise this medication Semaglutide-Weight Management 0.25 MG/0.5ML SOAJ  Pt states it needs a prior authorization.  Pt was given the number  [phone] Pt has healthy [employer].  Pt is anxious to start this med.

## 2021-03-30 NOTE — Progress Notes (Signed)
Date:  03/30/2021   Name:  Christine Arellano   DOB:  04-Feb-1991   MRN:  [id number]   Chief Complaint: Weight Check  Depression        This is a chronic problem.The problem is unchanged.  Associated symptoms include fatigue and headaches.  Treatments tried: currently under Psych - Trintellix, Prozac, Clonazepam, Trazodone and probably Lamictal. Back Pain This is a chronic problem. The problem has been waxing and waning since onset. The pain is present in the lumbar spine. The quality of the pain is described as aching. The pain does not radiate. The pain is mild. The symptoms are aggravated by twisting and bending. Associated symptoms include headaches. Pertinent negatives include no abdominal pain, chest pain or fever. She has tried analgesics for the symptoms.  Weight gain - she has lost weight well with phentermine intermittently.  She has difficulty maintaining the weight loss due to back pain that limits exercise and endurance.  She is short of breath on stairs, with intercourse, even leaning over to shave or tie her shoes.  Lab Results  Component Value Date   CREATININE 0.47 (L) 05/31/2018   BUN 8 05/31/2018   NA 139 05/31/2018   K 4.2 05/31/2018   CL 103 05/31/2018   CO2 19 (L) 05/31/2018   No results found for: CHOL, HDL, LDLCALC, LDLDIRECT, TRIG, CHOLHDL No results found for: TSH No results found for: HGBA1C Lab Results  Component Value Date   WBC 6.3 11/06/2018   HGB 13.4 11/06/2018   HCT 42.0 11/06/2018   MCV 88 11/06/2018   PLT 278 11/06/2018   Lab Results  Component Value Date   ALT 8 05/31/2018   AST 11 05/31/2018   ALKPHOS 40 05/31/2018   BILITOT <0.2 05/31/2018     Review of Systems  Constitutional:  Positive for fatigue. Negative for chills, fever and unexpected weight change.  Respiratory:  Positive for shortness of breath. Negative for chest tightness.   Cardiovascular:  Negative for chest pain, palpitations and leg swelling.  Gastrointestinal:   Negative for abdominal pain, constipation and diarrhea.  Musculoskeletal:  Positive for back pain.  Neurological:  Positive for headaches. Negative for dizziness and light-headedness.  Psychiatric/Behavioral:  Positive for depression, dysphoric mood and sleep disturbance. The patient is nervous/anxious.    Patient Active Problem List   Diagnosis Date Noted   Lumbar back pain 03/30/2021   BMI 33.0-33.9,adult 03/30/2021   Migraine headache without aura 01/26/2021   Inverted nipple 05/05/2019   Red eye 05/05/2019   Moderate episode of recurrent major depressive disorder ([place]) 05/05/2019   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   Generalized anxiety disorder 03/04/2019   Alopecia of scalp 03/04/2019   Dry eye syndrome of both eyes 10/18/2017   Adult acne 05/03/2015    No Known Allergies  Past Surgical History:  Procedure Laterality Date   arthroscopic knee Right 2008   CESAREAN SECTION  2014   CESAREAN SECTION N/A 02/24/2015   Procedure: CESAREAN SECTION;  Surgeon: Rubie Maid, MD;  Location: [facility] OR[facility];  Service: Obstetrics;  Laterality: N/A;   CESAREAN SECTION N/A 05/09/2017   Procedure: REPEAT CESAREAN SECTION;  Surgeon: Rubie Maid, MD;  Location: [facility] OR[facility];  Service: Obstetrics;  Laterality: N/A;   HEMORROIDECTOMY     THERAPEUTIC ABORTION  08/2018   fetal CNS abnormalities   TONSILLECTOMY     WISDOM TOOTH EXTRACTION      Social History   Tobacco Use   Smoking status: Former  Packs/day: 1.00    Types: Cigarettes    Quit date: 08/27/2009    Years since quitting: 11.5   Smokeless tobacco: Never  Vaping Use   Vaping Use: Never used  Substance Use Topics   Alcohol use: Not Currently    Alcohol/week: 0.0 standard drinks   Drug use: No     Medication list has been reviewed and updated.  Current Meds  Medication Sig   ammonium lactate (LAC-HYDRIN) 12 % lotion Apply topically.   clonazePAM (KLONOPIN) 0.5 MG tablet Take 0.25-0.5 mg by mouth daily as needed.   cyclobenzaprine  (FLEXERIL) 10 MG tablet TAKE 1 TABLET BY MOUTH EVERYDAY AT BEDTIME AS NEEDED   fluticasone (FLONASE) 50 MCG/ACT nasal spray SPRAY 2 SPRAYS INTO EACH NOSTRIL EVERY DAY (Patient taking differently: as needed.)   HIBICLENS 4 % external liquid APPLY TO AFFECTED AREA TWICE A DAY   ibuprofen (ADVIL) 800 MG tablet TAKE 1 TABLET BY MOUTH EVERY 8 HOURS AS NEEDED   norgestimate-ethinyl estradiol (ORTHO-CYCLEN) 0.25-35 MG-MCG tablet TAKE 1 TABLET BY MOUTH EVERY DAY   Semaglutide-Weight Management 0.25 MG/0.5ML SOAJ Inject 0.25 mg into the skin once a week.   traZODone (DESYREL) 100 MG tablet Take 100 mg by mouth at bedtime as needed.   vortioxetine HBr (TRINTELLIX) 10 MG TABS tablet Take 10 mg by mouth daily.   [DISCONTINUED] TRINTELLIX 10 MG TABS tablet TAKE 1 TABLET BY MOUTH EVERY DAY    PHQ 2/9 Scores 03/30/2021 08/25/2020 07/09/2020 06/07/2020  PHQ - 2 Score 6 2 2 4   PHQ- 9 Score 19 5 12 15     GAD 7 : Generalized Anxiety Score 03/30/2021 08/25/2020 07/09/2020 06/07/2020  Nervous, Anxious, on Edge 1 1 2 2   Control/stop worrying 1 1 2 2   Worry too much - different things 2 1 2 2   Trouble relaxing 1 0 0 1  Restless 1 0 0 1  Easily annoyed or irritable 2 1 3 2   Afraid - awful might happen 0 1 0 1  Total GAD 7 Score 8 5 9 11   Anxiety Difficulty Not difficult at all - - Somewhat difficult    BP Readings from Last 3 Encounters:  03/30/21 124/70  08/25/20 118/76  07/09/20 108/68    Physical Exam Vitals and nursing note reviewed.  Constitutional:      General: She is not in acute distress.    Appearance: Normal appearance. She is well-developed.  HENT:     Head: Normocephalic and atraumatic.  Cardiovascular:     Rate and Rhythm: Normal rate and regular rhythm.     Pulses: Normal pulses.     Heart sounds: No murmur heard. Pulmonary:     Effort: Pulmonary effort is normal. No respiratory distress.     Breath sounds: No wheezing or rhonchi.  Musculoskeletal:     Cervical back: Normal range of  motion.     Lumbar back: Bony tenderness present. Decreased range of motion. Negative right straight leg raise test and negative left straight leg raise test.     Right lower leg: No edema.     Left lower leg: No edema.  Lymphadenopathy:     Cervical: No cervical adenopathy.  Skin:    General: Skin is warm and dry.     Findings: No rash.  Neurological:     Mental Status: She is alert and oriented to person, place, and time.  Psychiatric:        Mood and Affect: Mood is depressed. Affect is flat.  Speech: Speech normal.        Behavior: Behavior normal.    Wt Readings from Last 3 Encounters:  03/30/21 182 lb 12.8 oz (82.9 kg)  08/25/20 177 lb (80.3 kg)  07/09/20 196 lb (88.9 kg)    BP 124/70   Pulse 88   Ht 5\' 2"  (1.575 m)   Wt 182 lb 12.8 oz (82.9 kg)   SpO2 99%   BMI 33.43 kg/m   Assessment and Plan: 1. BMI 33.0-33.9,adult I am hesitant to continue phentermine with all of her psych meds. With chronic back pain we may be able to get [facility] covered Long discussion about diet changes and need to begin regular exercise daily - gradually increasing duration and intensity. - Semaglutide-Weight Management 0.25 MG/0.5ML SOAJ; Inject 0.25 mg into the skin once a week.  Dispense: 2 mL; Refill: 0  2. Lumbar back pain Continue OTC analgesics.  3. Moderate episode of recurrent major depressive disorder ([place]) Continue current medications and psych follow up.   Partially dictated using Editor, commissioning. Any errors are unintentional.  [name], MD [facility]  03/30/2021

## 2021-03-31 DIAGNOSIS — F429 Obsessive-compulsive disorder, unspecified: Secondary | ICD-10-CM | POA: Diagnosis not present

## 2021-03-31 DIAGNOSIS — F332 Major depressive disorder, recurrent severe without psychotic features: Secondary | ICD-10-CM | POA: Diagnosis not present

## 2021-03-31 NOTE — Telephone Encounter (Signed)
Called patient insurance company and completed Prior Auth. Ref# [id number]. Turn around time is 24 hrs. All PA questions were related to metformin or diabetes.

## 2021-04-01 ENCOUNTER — Telehealth: Payer: Self-pay

## 2021-04-01 NOTE — Telephone Encounter (Signed)
Called patient insurance company and completed Prior Auth. Ref# [id number]. Turn around time is 24 hrs. All PA questions were related to metformin or diabetes.   Received PA response this morning and it was DENIED due to patient not having diabetes. Will route to Dr. Army Melia for review.

## 2021-04-01 NOTE — Telephone Encounter (Signed)
Sent patient my chart message about sending in alternative. Waiting for response.

## 2021-04-04 ENCOUNTER — Telehealth: Payer: Self-pay

## 2021-04-04 ENCOUNTER — Other Ambulatory Visit: Payer: Self-pay | Admitting: Internal Medicine

## 2021-04-04 DIAGNOSIS — Z6833 Body mass index (BMI) 33.0-33.9, adult: Secondary | ICD-10-CM

## 2021-04-04 MED ORDER — SAXENDA 18 MG/3ML ~~LOC~~ SOPN: 0.6000 mg | PEN_INJECTOR | Freq: Every day | SUBCUTANEOUS | 0 refills | Status: DC

## 2021-04-04 MED ORDER — PEN NEEDLES 32G X 5 MM MISC: 1.0000 | Freq: Every day | 0 refills | Status: DC

## 2021-04-04 NOTE — Telephone Encounter (Signed)
Called patients insurance company and completed PA for [employer].  REF# [id number]   24 hour turn around time; awaiting outcome.

## 2021-04-04 NOTE — Telephone Encounter (Signed)
Alternative requested

## 2021-04-05 NOTE — Telephone Encounter (Signed)
PA Denied by insurance. Patients plan does not cover weight management medication. Will inform patient.

## 2021-04-14 ENCOUNTER — Telehealth: Payer: Self-pay | Admitting: Internal Medicine

## 2021-04-14 NOTE — Telephone Encounter (Signed)
I have already completed this. I called Medicaid and PA was denied. Patient is already informed of this.

## 2021-04-14 NOTE — Telephone Encounter (Signed)
Chelsea, from [employer], calling in regards to the pts Sax[place]. She states that the pt is needing to have a PA sent to [facility]. Please advise.     Callback# for medicaid [phone]     Callback [phone] [id number]  Reference # [facility]

## 2021-04-26 ENCOUNTER — Other Ambulatory Visit: Payer: Self-pay | Admitting: Internal Medicine

## 2021-04-26 DIAGNOSIS — Z308 Encounter for other contraceptive management: Secondary | ICD-10-CM

## 2021-04-26 DIAGNOSIS — R0981 Nasal congestion: Secondary | ICD-10-CM

## 2021-04-27 NOTE — Telephone Encounter (Signed)
Requested Prescriptions  Pending Prescriptions Disp Refills  . norgestimate-ethinyl estradiol (ORTHO-CYCLEN) 0.25-35 MG-MCG tablet [Pharmacy Med Name: NORG-ETHIN ESTRA 0.25-0.035 MG] 84 tablet 1    Sig: TAKE 1 TABLET BY MOUTH EVERY DAY     OB/GYN:  Contraceptives Passed - 04/26/2021 11:44 AM      Passed - Last BP in normal range    BP Readings from Last 1 Encounters:  03/30/21 124/70         Passed - Valid encounter within last 12 months    Recent Outpatient Visits          4 weeks ago BMI 33.0-33.9,adult   [facility] Glean Hess, MD   8 months ago Acute cystitis with hematuria   [facility] Glean Hess, MD   9 months ago Encounter for other contraceptive management   [facility] Glean Hess, MD   10 months ago Migraine with aura and without status migrainosus, not intractable   [facility] Glean Hess, MD   1 year ago Chronic nasal congestion   [facility] Glean Hess, MD      Future Appointments            In 1 month Army Melia Jesse Sans, MD [facility] [place]           . fluticasone (FLONASE) 50 MCG/ACT nasal spray [Pharmacy Med Name: FLUTICASONE PROP 50 MCG SPRAY] 16 mL 2    Sig: SPRAY 2 SPRAYS INTO EACH NOSTRIL EVERY DAY     Ear, Nose, and Throat: Nasal Preparations - Corticosteroids Passed - 04/26/2021 11:44 AM      Passed - Valid encounter within last 12 months    Recent Outpatient Visits          4 weeks ago BMI 33.0-33.9,adult   [facility] Glean Hess, MD   8 months ago Acute cystitis with hematuria   [facility] Glean Hess, MD   9 months ago Encounter for other contraceptive management   [facility] Glean Hess, MD   10 months ago Migraine with aura and without status migrainosus, not intractable   [facility] Glean Hess, MD   1 year ago Chronic nasal congestion   [facility] Glean Hess,  MD      Future Appointments            In 1 month Army Melia, Jesse Sans, MD [facility]

## 2021-05-03 DIAGNOSIS — L700 Acne vulgaris: Secondary | ICD-10-CM | POA: Diagnosis not present

## 2021-05-03 DIAGNOSIS — L648 Other androgenic alopecia: Secondary | ICD-10-CM | POA: Diagnosis not present

## 2021-05-03 DIAGNOSIS — L650 Telogen effluvium: Secondary | ICD-10-CM | POA: Diagnosis not present

## 2021-05-10 ENCOUNTER — Ambulatory Visit: Payer: Self-pay | Admitting: *Deleted

## 2021-05-10 NOTE — Telephone Encounter (Signed)
Noted   Pt has an appt scheduled.  KP

## 2021-05-10 NOTE — Telephone Encounter (Signed)
Patient called, left VM to return the call to the office to discuss symptoms with a nurse.  

## 2021-05-10 NOTE — Telephone Encounter (Signed)
Pt has achy knees and ankles, getting worse, dealt with this for a while. Hurts to walk. Please advise      Called patient to review sx. No answer, left message to call clinic back [phone].

## 2021-05-10 NOTE — Telephone Encounter (Signed)
°  Chief Complaint: knee pain Symptoms: chronic knee pain- R worse but bilateral pain Frequency: chronic daily- whenever up and moving Pertinent Negatives: Patient denies chest pain, difficulty breathing Disposition: [] ED /[] Urgent Care (no appt availability in office) / [x] Appointment(In office/virtual)/ []  [facility]/ [] Home Care/ [] Refused Recommended Disposition  Additional Notes: Patient has been scheduled for 12/19- but is requesting sooner appointment- advised will send request to office for review. Patient also states she has bilateral breast pain for over 1 year.(No abnormal lump/cysts- just tenderness)   Reason for Disposition  [1] MODERATE pain (e.g., interferes with normal activities, limping) AND [2] present > 3 days  Answer Assessment - Initial Assessment Questions 1. LOCATION and RADIATION: "Where is the pain located?"      Bilateral- R worse 2. QUALITY: "What does the pain feel like?"  (e.g., sharp, dull, aching, burning)     Constant ache 3. SEVERITY: "How bad is the pain?" "What does it keep you from doing?"   (Scale 1-10; or mild, moderate, severe)   -  MILD (1-3): doesn't interfere with normal activities    -  MODERATE (4-7): interferes with normal activities (e.g., work or school) or awakens from sleep, limping    -  SEVERE (8-10): excruciating pain, unable to do any normal activities, unable to walk     Moderate/severe 4. ONSET: "When did the pain start?" "Does it come and go, or is it there all the time?"     chronic 5. RECURRENT: "Have you had this pain before?" If Yes, ask: "When, and what happened then?"     Chronic pain 6. SETTING: "Has there been any recent work, exercise or other activity that involved that part of the body?"      Old injury-2008 7. AGGRAVATING FACTORS: "What makes the knee pain worse?" (e.g., walking, climbing stairs, running)     Being on feet 8. ASSOCIATED SYMPTOMS: "Is there any swelling or redness of the knee?"     Ankle  pain, lower back pain 9. OTHER SYMPTOMS: "Do you have any other symptoms?" (e.g., chest pain, difficulty breathing, fever, calf pain)     Swelling, feel hot at time 10. PREGNANCY: "Is there any chance you are pregnant?" "When was your last menstrual period?"       No  Protocols used: Knee Pain-A-AH

## 2021-05-16 ENCOUNTER — Ambulatory Visit: Payer: Medicaid Other | Admitting: Internal Medicine

## 2021-05-16 ENCOUNTER — Encounter: Payer: Self-pay | Admitting: Internal Medicine

## 2021-05-16 ENCOUNTER — Ambulatory Visit
Admission: RE | Admit: 2021-05-16 | Discharge: 2021-05-16 | Disposition: A | Discharge status: Home / Self Care | Payer: Medicaid Other | Source: Ambulatory Visit | Attending: Internal Medicine | Admitting: Internal Medicine

## 2021-05-16 ENCOUNTER — Ambulatory Visit
Admission: RE | Admit: 2021-05-16 | Discharge: 2021-05-16 | Disposition: A | Discharge status: Home / Self Care | Payer: Medicaid Other | Attending: Internal Medicine | Admitting: Internal Medicine

## 2021-05-16 ENCOUNTER — Other Ambulatory Visit: Payer: Self-pay

## 2021-05-16 VITALS — BP 122/74 | HR 76 | Ht 62.0 in | Wt 187.2 lb

## 2021-05-16 DIAGNOSIS — M25561 Pain in right knee: Secondary | ICD-10-CM

## 2021-05-16 DIAGNOSIS — N644 Mastodynia: Secondary | ICD-10-CM

## 2021-05-16 DIAGNOSIS — M545 Low back pain, unspecified: Secondary | ICD-10-CM

## 2021-05-16 DIAGNOSIS — M25562 Pain in left knee: Secondary | ICD-10-CM | POA: Diagnosis not present

## 2021-05-16 DIAGNOSIS — M79604 Pain in right leg: Secondary | ICD-10-CM | POA: Diagnosis not present

## 2021-05-16 NOTE — Progress Notes (Signed)
Date:  05/16/2021   Name:  Christine Arellano   DOB:  04-23-1991   MRN:  [id number]   Chief Complaint: Knee Pain and Ankle Pain (/)  Knee Pain  The incident occurred more than 1 week ago. The pain is present in the left knee, right knee, right ankle and left ankle. The quality of the pain is described as cramping, aching and shooting. The pain is moderate. The pain has been Constant since onset. She reports no foreign bodies present.  Back Pain This is a recurrent problem. The problem has been waxing and waning since onset. The pain is present in the lumbar spine. The pain radiates to the right knee. The pain is moderate. Pertinent negatives include no chest pain, fever or headaches.   Lab Results  Component Value Date   NA 139 05/31/2018   K 4.2 05/31/2018   CO2 19 (L) 05/31/2018   GLUCOSE 69 05/31/2018   BUN 8 05/31/2018   CREATININE 0.47 (L) 05/31/2018   CALCIUM 9.2 05/31/2018   GFRNONAA 134 05/31/2018   No results found for: CHOL, HDL, LDLCALC, LDLDIRECT, TRIG, CHOLHDL No results found for: TSH No results found for: HGBA1C Lab Results  Component Value Date   WBC 6.3 11/06/2018   HGB 13.4 11/06/2018   HCT 42.0 11/06/2018   MCV 88 11/06/2018   PLT 278 11/06/2018   Lab Results  Component Value Date   ALT 8 05/31/2018   AST 11 05/31/2018   ALKPHOS 40 05/31/2018   BILITOT <0.2 05/31/2018   Lab Results  Component Value Date   VD25OH 33.2 11/06/2018     Review of Systems  Constitutional:  Negative for chills, fatigue, fever and unexpected weight change.  Respiratory:  Negative for choking, shortness of breath and wheezing.   Cardiovascular:  Negative for chest pain and palpitations.  Musculoskeletal:  Positive for arthralgias (multiple joint aches and pains - knees, ankles, back) and back pain.  Neurological:  Negative for dizziness, light-headedness and headaches.   Patient Active Problem List   Diagnosis Date Noted   Lumbar back pain 03/30/2021   BMI  33.0-33.9,adult 03/30/2021   Migraine headache without aura 01/26/2021   Inverted nipple 05/05/2019   Red eye 05/05/2019   Moderate episode of recurrent major depressive disorder ([place]) 05/05/2019   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   Generalized anxiety disorder 03/04/2019   Alopecia of scalp 03/04/2019   Dry eye syndrome of both eyes 10/18/2017   Adult acne 05/03/2015    No Known Allergies  Past Surgical History:  Procedure Laterality Date   arthroscopic knee Right 2008   CESAREAN SECTION  2014   CESAREAN SECTION N/A 02/24/2015   Procedure: CESAREAN SECTION;  Surgeon: Rubie Maid, MD;  Location: [facility] ORS;  Service: Obstetrics;  Laterality: N/A;   CESAREAN SECTION N/A 05/09/2017   Procedure: REPEAT CESAREAN SECTION;  Surgeon: Rubie Maid, MD;  Location: [facility] ORS;  Service: Obstetrics;  Laterality: N/A;   HEMORROIDECTOMY     THERAPEUTIC ABORTION  08/2018   fetal CNS abnormalities   TONSILLECTOMY     WISDOM TOOTH EXTRACTION      Social History   Tobacco Use   Smoking status: Former    Packs/day: 1.00    Types: Cigarettes    Quit date: 08/27/2009    Years since quitting: 11.7   Smokeless tobacco: Never  Vaping Use   Vaping Use: Never used  Substance Use Topics   Alcohol use: Not Currently    Alcohol/week: 0.0 standard  drinks   Drug use: No     Medication list has been reviewed and updated.  Current Meds  Medication Sig   ammonium lactate (LAC-HYDRIN) 12 % lotion Apply topically.   clonazePAM (KLONOPIN) 0.5 MG tablet Take 0.25-0.5 mg by mouth daily as needed.   cyclobenzaprine (FLEXERIL) 10 MG tablet TAKE 1 TABLET BY MOUTH EVERYDAY AT BEDTIME AS NEEDED   FLUoxetine (PROZAC) 40 MG capsule Take 40 mg by mouth daily.   fluticasone (FLONASE) 50 MCG/ACT nasal spray SPRAY 2 SPRAYS INTO EACH NOSTRIL EVERY DAY   gabapentin (NEURONTIN) 100 MG capsule Take 100-200 mg by mouth 3 (three) times daily.   HIBICLENS 4 % external liquid APPLY TO AFFECTED AREA TWICE A DAY   ibuprofen  (ADVIL) 800 MG tablet TAKE 1 TABLET BY MOUTH EVERY 8 HOURS AS NEEDED   lamoTRIgine (LAMICTAL) 100 MG tablet Take 100 mg by mouth daily.   Liraglutide -Weight Management (SAXENDA) 18 MG/3ML SOPN Inject 0.6 mg into the skin daily.   minoxidil (LONITEN) 2.5 MG tablet Take 1.25 mg by mouth 2 (two) times daily.   norgestimate-ethinyl estradiol (ORTHO-CYCLEN) 0.25-35 MG-MCG tablet TAKE 1 TABLET BY MOUTH EVERY DAY   RETIN-A 0.05 % cream Apply 1 application topically at bedtime.   spironolactone (ALDACTONE) 100 MG tablet Take 100 mg by mouth daily.   traZODone (DESYREL) 100 MG tablet Take 100 mg by mouth at bedtime as needed.   vortioxetine HBr (TRINTELLIX) 10 MG TABS tablet Take 10 mg by mouth daily.   VYVANSE 30 MG capsule Take 30 mg by mouth every morning.    PHQ 2/9 Scores 05/16/2021 03/30/2021 08/25/2020 07/09/2020  PHQ - 2 Score 6 6 2 2   PHQ- 9 Score 16 19 5 12     GAD 7 : Generalized Anxiety Score 05/16/2021 03/30/2021 08/25/2020 07/09/2020  Nervous, Anxious, on Edge 1 1 1 2   Control/stop worrying 3 1 1 2   Worry too much - different things 3 2 1 2   Trouble relaxing 0 1 0 0  Restless 0 1 0 0  Easily annoyed or irritable 2 2 1 3   Afraid - awful might happen 1 0 1 0  Total GAD 7 Score 10 8 5 9   Anxiety Difficulty Not difficult at all Not difficult at all - -    BP Readings from Last 3 Encounters:  05/16/21 122/74  03/30/21 124/70  08/25/20 118/76    Physical Exam Constitutional:      Appearance: Normal appearance.  Cardiovascular:     Rate and Rhythm: Normal rate and regular rhythm.  Pulmonary:     Effort: Pulmonary effort is normal. No respiratory distress.     Breath sounds: No wheezing or rhonchi.  Chest:  Breasts:    Right: Tenderness present. No mass, nipple discharge or skin change.     Left: Tenderness present. No mass, nipple discharge or skin change.  Musculoskeletal:     Right wrist: Normal.     Left wrist: Normal.     Cervical back: Normal range of motion.     Right  knee: No deformity or effusion. Decreased range of motion. Tenderness present over the medial joint line and patellar tendon.     Left knee: No deformity or effusion. Normal range of motion. Tenderness present over the medial joint line and patellar tendon.     Right ankle: Normal.     Right Achilles Tendon: Normal.     Left ankle: Normal.     Left Achilles Tendon: Normal.  Neurological:  Mental Status: She is alert.    Wt Readings from Last 3 Encounters:  05/16/21 187 lb 3.2 oz (84.9 kg)  03/30/21 182 lb 12.8 oz (82.9 kg)  08/25/20 177 lb (80.3 kg)    BP 122/74    Pulse 76    Ht 5\' 2"  (1.575 m)    Wt 187 lb 3.2 oz (84.9 kg)    LMP  (Exact Date)    SpO2 98%    BMI 34.24 kg/m   Assessment and Plan: 1. Arthralgia of both knees With hx of arthroscopic surgery in 2008. Now with persistent discomfort, esp with stairs.   Has seen [facility] in [place] Dr. Mack Guise. Will rule out autoimmune cause since she has multiple joint complaints. - ANA w/Reflex if Positive - Rheumatoid factor - Sedimentation rate - CYCLIC CITRUL PEPTIDE ANTIBODY, IGG/IGA - DG Knee Complete 4 Views Right; Future  2. 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 Some sciatica nerve involvement on the right as well. - DG Lumbar Spine Complete; Future  3. Breast tenderness in female Normal exam with bilateral fibrocystic changes. On OCPs which are likely contributing but she does not want to stop at this time. Try Vitamin E capsule once daily and avoid caffeine   Partially dictated using Editor, commissioning. Any errors are unintentional.  [name], MD [facility]  05/16/2021

## 2021-05-16 NOTE — Patient Instructions (Signed)
Try vitamin E capsule daily.

## 2021-05-18 ENCOUNTER — Other Ambulatory Visit: Payer: Self-pay

## 2021-05-18 ENCOUNTER — Telehealth: Payer: Self-pay | Admitting: Internal Medicine

## 2021-05-18 DIAGNOSIS — M25562 Pain in left knee: Secondary | ICD-10-CM

## 2021-05-18 LAB — CYCLIC CITRUL PEPTIDE ANTIBODY, IGG/IGA: Cyclic Citrullin Peptide Ab: 3 units (ref 0–19)

## 2021-05-18 LAB — ANA W/REFLEX IF POSITIVE: Anti Nuclear Antibody (ANA): NEGATIVE

## 2021-05-18 LAB — SEDIMENTATION RATE: Sed Rate: 5 mm/hr (ref 0–32)

## 2021-05-18 LAB — RHEUMATOID FACTOR: Rheumatoid fact SerPl-aCnc: <10 IU/mL (ref <14.0)

## 2021-05-18 NOTE — Progress Notes (Signed)
Referral placed.  KP 

## 2021-05-18 NOTE — Telephone Encounter (Signed)
Referral Request - Has patient seen PCP for this complaint? yes *If NO, is insurance requiring patient see PCP for this issue before PCP can refer them? Referral for which specialty:ortho Preferred provider/office:N/A Reason for referral:pain in knees ankle and back

## 2021-05-18 NOTE — Telephone Encounter (Signed)
Referral placed.  KP 

## 2021-05-19 ENCOUNTER — Telehealth: Payer: Self-pay

## 2021-05-19 NOTE — Telephone Encounter (Signed)
We do not provide discs for these. She will need to get these from the radiology department. I will fax [facility] the results of the Xrays.

## 2021-05-19 NOTE — Telephone Encounter (Signed)
Copied from [place] [id number]. Topic: General - Other >> May 19, 2021  9:37 AM Leward Quan A wrote: Reason for CRM: Patient called in to inform Dr Army Melia that [facility] would like a copy of her Xrays on a disc and if that can not be done would like copy of everything sent over to them. Patient have an appointment tomorrow 05/20/21. Please advise call patient with an update at Ph# [phone]

## 2021-05-20 DIAGNOSIS — M25561 Pain in right knee: Secondary | ICD-10-CM | POA: Diagnosis not present

## 2021-05-20 DIAGNOSIS — M545 Low back pain, unspecified: Secondary | ICD-10-CM | POA: Diagnosis not present

## 2021-06-03 ENCOUNTER — Encounter: Payer: Medicaid Other | Admitting: Internal Medicine

## 2021-06-04 ENCOUNTER — Other Ambulatory Visit: Payer: Self-pay | Admitting: Internal Medicine

## 2021-06-04 DIAGNOSIS — M546 Pain in thoracic spine: Secondary | ICD-10-CM

## 2021-06-04 NOTE — Telephone Encounter (Signed)
Requested medication (s) are due for refill today: yes  Requested medication (s) are on the active medication list: yes  Last refill:  01/10/21 #30  Future visit scheduled: yes  Notes to clinic:  med not delegated to NT to RF   Requested Prescriptions  Pending Prescriptions Disp Refills   cyclobenzaprine (FLEXERIL) 10 MG tablet [Pharmacy Med Name: CYCLOBENZAPRINE 10 MG TABLET] 30 tablet 0    Sig: TAKE 1 TABLET BY MOUTH EVERYDAY AT BEDTIME AS NEEDED     Not Delegated - Analgesics:  Muscle Relaxants Failed - 06/04/2021 10:58 AM      Failed - This refill cannot be delegated      Passed - Valid encounter within last 6 months    Recent Outpatient Visits           2 weeks ago Arthralgia of both knees   [facility] Glean Hess, MD   2 months ago BMI 33.0-33.9,adult   [facility] Glean Hess, MD   9 months ago Acute cystitis with hematuria   [facility] Glean Hess, MD   11 months ago Encounter for other contraceptive management   [facility] Glean Hess, MD   12 months ago Migraine with aura and without status migrainosus, not intractable   [facility] Glean Hess, MD       Future Appointments             In 3 weeks Army Melia Jesse Sans, MD [facility]

## 2021-06-05 ENCOUNTER — Other Ambulatory Visit: Payer: Self-pay | Admitting: Internal Medicine

## 2021-06-05 DIAGNOSIS — G43109 Migraine with aura, not intractable, without status migrainosus: Secondary | ICD-10-CM

## 2021-06-05 NOTE — Telephone Encounter (Signed)
Ibuprofen needs office visit last RF 12/27/20 #60 Topamax was dc'd 03/30/21 completed course McAdoo,Chassidy CMA Topamax not delegated to NT to RF Requested Prescriptions  Refused Prescriptions Disp Refills   ibuprofen (ADVIL) 800 MG tablet [Pharmacy Med Name: IBUPROFEN 800 MG TABLET] 60 tablet 0    Sig: TAKE 1 TABLET BY MOUTH EVERY 8 HOURS AS NEEDED     Analgesics:  NSAIDS Failed - 06/05/2021 11:42 AM      Failed - Cr in normal range and within 360 days    Creatinine, Ser  Date Value Ref Range Status  05/31/2018 0.47 (L) 0.57 - 1.00 mg/dL Final         Failed - HGB in normal range and within 360 days    Hemoglobin  Date Value Ref Range Status  11/06/2018 13.4 11.1 - 15.9 g/dL Final  07/21/2014 13.8 g/dL Final         Passed - Patient is not pregnant      Passed - Valid encounter within last 12 months    Recent Outpatient Visits          2 weeks ago Arthralgia of both knees   [facility] Glean Hess, MD   2 months ago BMI 33.0-33.9,adult   [facility] Glean Hess, MD   9 months ago Acute cystitis with hematuria   [facility] Glean Hess, MD   11 months ago Encounter for other contraceptive management   [facility] Glean Hess, MD   12 months ago Migraine with aura and without status migrainosus, not intractable   [facility] Glean Hess, MD      Future Appointments            In 3 weeks Glean Hess, MD [facility]            topiramate (TOPAMAX) 25 MG tablet [Pharmacy Med Name: TOPIRAMATE 25 MG TABLET] 270 tablet     Sig: TAKE 3 TABLETS BY MOUTH AT BEDTIME.     Not Delegated - Neurology: Anticonvulsants - topiramate & zonisamide Failed - 06/05/2021 11:42 AM      Failed - This refill cannot be delegated      Failed - Cr in normal range and within 360 days    Creatinine, Ser  Date Value Ref Range Status  05/31/2018 0.47 (L) 0.57 - 1.00 mg/dL Final         Failed -  CO2 in normal range and within 360 days    CO2  Date Value Ref Range Status  05/31/2018 19 (L) 20 - 29 mmol/L Final         Passed - Valid encounter within last 12 months    Recent Outpatient Visits          2 weeks ago Arthralgia of both knees   [place], Laura H, MD   2 months ago BMI 33.0-33.9,adult   [facility] Glean Hess, MD   9 months ago Acute cystitis with hematuria   [facility] Glean Hess, MD   11 months ago Encounter for other contraceptive management   [facility] Glean Hess, MD   12 months ago Migraine with aura and without status migrainosus, not intractable   [facility] Glean Hess, MD      Future Appointments            In 3 weeks Glean Hess, MD  [place]

## 2021-06-20 ENCOUNTER — Other Ambulatory Visit: Payer: Self-pay | Admitting: Internal Medicine

## 2021-06-20 DIAGNOSIS — F331 Major depressive disorder, recurrent, moderate: Secondary | ICD-10-CM

## 2021-06-20 DIAGNOSIS — F5101 Primary insomnia: Secondary | ICD-10-CM

## 2021-06-20 NOTE — Telephone Encounter (Signed)
Requested medication (s) are due for refill today: Amount not specified  Requested medication (s) are on the active medication list: yes    Last refill: 03/30/21  Future visit scheduled yes 06/29/21  Notes to clinic: Historical Provider  Requested Prescriptions  Pending Prescriptions Disp Refills   TRINTELLIX 10 MG TABS tablet [Pharmacy Med Name: TRINTELLIX 10 MG TABLET] 30 tablet 2    Sig: TAKE 1 [place]     Psychiatry: Antidepressants - Serotonin Modulator Passed - 06/20/2021  8:13 AM      Passed - Completed PHQ-2 or PHQ-9 in the last 360 days      Passed - Valid encounter within last 6 months    Recent Outpatient Visits           1 month ago Arthralgia of both knees   [facility] Glean Hess, MD   2 months ago BMI 33.0-33.9,adult   [facility] Glean Hess, MD   9 months ago Acute cystitis with hematuria   [facility] Glean Hess, MD   11 months ago Encounter for other contraceptive management   [facility] Glean Hess, MD   1 year ago Migraine with aura and without status migrainosus, not intractable   [facility] Glean Hess, MD       Future Appointments             In 1 week Army Melia Jesse Sans, MD [facility] [place] Prescriptions Disp Refills   traZODone (DESYREL) 50 MG tablet [Pharmacy Med Name: TRAZODONE 50 MG TABLET] 30 tablet 0    Sig: TAKE 1 TABLET BY MOUTH EVERY DAY AT BEDTIME AS NEEDED FOR SLEEP     Psychiatry: Antidepressants - Serotonin Modulator Passed - 06/20/2021  8:13 AM      Passed - Completed PHQ-2 or PHQ-9 in the last 360 days      Passed - Valid encounter within last 6 months    Recent Outpatient Visits           1 month ago Arthralgia of both knees   [facility] Glean Hess, MD   2 months ago BMI 33.0-33.9,adult   [facility] Glean Hess, MD   9 months ago Acute cystitis with  hematuria   [facility] Glean Hess, MD   11 months ago Encounter for other contraceptive management   [facility] Glean Hess, MD   1 year ago Migraine with aura and without status migrainosus, not intractable   [facility] Glean Hess, MD       Future Appointments             In 1 week Army Melia Jesse Sans, MD [facility], [facility]

## 2021-06-20 NOTE — Telephone Encounter (Signed)
Discontinued 01/26/21  Change in therapy.

## 2021-06-29 ENCOUNTER — Other Ambulatory Visit: Payer: Self-pay

## 2021-06-29 ENCOUNTER — Ambulatory Visit (INDEPENDENT_AMBULATORY_CARE_PROVIDER_SITE_OTHER): Payer: Medicaid Other | Admitting: Internal Medicine

## 2021-06-29 ENCOUNTER — Other Ambulatory Visit (HOSPITAL_COMMUNITY)
Admission: RE | Admit: 2021-06-29 | Discharge: 2021-06-29 | Disposition: A | Discharge status: Home / Self Care | Payer: Medicaid Other | Source: Ambulatory Visit | Attending: Internal Medicine | Admitting: Internal Medicine

## 2021-06-29 ENCOUNTER — Encounter: Payer: Medicaid Other | Admitting: Internal Medicine

## 2021-06-29 ENCOUNTER — Encounter: Payer: Self-pay | Admitting: Internal Medicine

## 2021-06-29 VITALS — BP 104/74 | HR 84 | Ht 62.0 in | Wt 181.0 lb

## 2021-06-29 DIAGNOSIS — F331 Major depressive disorder, recurrent, moderate: Secondary | ICD-10-CM

## 2021-06-29 DIAGNOSIS — Z124 Encounter for screening for malignant neoplasm of cervix: Secondary | ICD-10-CM

## 2021-06-29 DIAGNOSIS — B354 Tinea corporis: Secondary | ICD-10-CM | POA: Diagnosis not present

## 2021-06-29 DIAGNOSIS — M25561 Pain in right knee: Secondary | ICD-10-CM | POA: Diagnosis not present

## 2021-06-29 DIAGNOSIS — Z0000 Encounter for general adult medical examination without abnormal findings: Secondary | ICD-10-CM | POA: Diagnosis not present

## 2021-06-29 MED ORDER — NYSTATIN 100000 UNIT/GM EX OINT: 1.0000 "application " | TOPICAL_OINTMENT | Freq: Two times a day (BID) | CUTANEOUS | 0 refills | Status: DC

## 2021-06-29 NOTE — Progress Notes (Signed)
Date:  06/29/2021   Name:  Christine Arellano   DOB:  [birth year]   MRN:  [id number]   Chief Complaint: Annual Exam (Breast exam with pap) Christine Arellano is a 31 y.o. female who presents today for her Complete Annual Exam. She feels well. She reports exercising none. She reports she is sleeping well. Breast complaints both breast sore.  Mammogram: none DEXA: none Pap smear: 06/2016 Colonoscopy: none  Immunization History  Administered Date(s) Administered   Influenza,inj,Quad PF,6+ Mos 02/05/2015, 06/06/2016, 03/04/2019, 03/01/2020   Tdap 12/03/2014, 02/22/2017    HPI  Lab Results  Component Value Date   NA 139 05/31/2018   K 4.2 05/31/2018   CO2 19 (L) 05/31/2018   GLUCOSE 69 05/31/2018   BUN 8 05/31/2018   CREATININE 0.47 (L) 05/31/2018   CALCIUM 9.2 05/31/2018   GFRNONAA 134 05/31/2018   No results found for: CHOL, HDL, LDLCALC, LDLDIRECT, TRIG, CHOLHDL No results found for: TSH No results found for: HGBA1C Lab Results  Component Value Date   WBC 6.3 11/06/2018   HGB 13.4 11/06/2018   HCT 42.0 11/06/2018   MCV 88 11/06/2018   PLT 278 11/06/2018   Lab Results  Component Value Date   ALT 8 05/31/2018   AST 11 05/31/2018   ALKPHOS 40 05/31/2018   BILITOT <0.2 05/31/2018   Lab Results  Component Value Date   VD25OH 33.2 11/06/2018     Review of Systems  Constitutional:  Negative for chills, fatigue and fever.  HENT:  Negative for congestion, hearing loss, tinnitus, trouble swallowing and voice change.   Eyes:  Negative for visual disturbance.  Respiratory:  Negative for cough, chest tightness, shortness of breath and wheezing.   Cardiovascular:  Negative for chest pain, palpitations and leg swelling.  Gastrointestinal:  Negative for abdominal pain, constipation, diarrhea and vomiting.  Endocrine: Negative for polydipsia and polyuria.  Genitourinary:  Negative for dysuria, frequency, genital sores, vaginal bleeding and vaginal discharge.  Musculoskeletal:   Negative for arthralgias, gait problem and joint swelling.  Skin:  Positive for rash (redness and itching under abdominal fold). Negative for color change.  Neurological:  Negative for dizziness, tremors, light-headedness and headaches.  Hematological:  Negative for adenopathy. Does not bruise/bleed easily.  Psychiatric/Behavioral:  Negative for dysphoric mood and sleep disturbance. The patient is not nervous/anxious.    Patient Active Problem List   Diagnosis Date Noted   Lumbar back pain 03/30/2021   BMI 33.0-33.9,adult 03/30/2021   Migraine headache without aura 01/26/2021   Inverted nipple 05/05/2019   Moderate episode of recurrent major depressive disorder ([place]) 05/05/2019   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   Generalized anxiety disorder 03/04/2019   Alopecia of scalp 03/04/2019   Dry eye syndrome of both eyes 10/18/2017   Adult acne 05/03/2015    No Known Allergies  Past Surgical History:  Procedure Laterality Date   arthroscopic knee Right 2008   CESAREAN SECTION  2014   CESAREAN SECTION N/A 02/24/2015   Procedure: CESAREAN SECTION;  Surgeon: Rubie Maid, MD;  Location: [facility] ORS;  Service: Obstetrics;  Laterality: N/A;   CESAREAN SECTION N/A 05/09/2017   Procedure: REPEAT CESAREAN SECTION;  Surgeon: Rubie Maid, MD;  Location: [facility] ORS;  Service: Obstetrics;  Laterality: N/A;   HEMORROIDECTOMY     THERAPEUTIC ABORTION  08/2018   fetal CNS abnormalities   TONSILLECTOMY     WISDOM TOOTH EXTRACTION      Social History   Tobacco Use   Smoking status: Former  Packs/day: 1.00    Types: Cigarettes    Quit date: 08/27/2009    Years since quitting: 11.8   Smokeless tobacco: Never  Vaping Use   Vaping Use: Never used  Substance Use Topics   Alcohol use: Not Currently    Alcohol/week: 0.0 standard drinks   Drug use: No     Medication list has been reviewed and updated.  Current Meds  Medication Sig   ammonium lactate (LAC-HYDRIN) 12 % lotion Apply topically.    clonazePAM (KLONOPIN) 0.5 MG tablet Take 0.25-0.5 mg by mouth daily as needed.   cyclobenzaprine (FLEXERIL) 10 MG tablet TAKE 1 TABLET BY MOUTH EVERYDAY AT BEDTIME AS NEEDED   FLUoxetine (PROZAC) 40 MG capsule Take 40 mg by mouth daily.   fluticasone (FLONASE) 50 MCG/ACT nasal spray SPRAY 2 SPRAYS INTO EACH NOSTRIL EVERY DAY   gabapentin (NEURONTIN) 100 MG capsule Take 100-200 mg by mouth 3 (three) times daily.   HIBICLENS 4 % external liquid APPLY TO AFFECTED AREA TWICE A DAY   ibuprofen (ADVIL) 800 MG tablet TAKE 1 TABLET BY MOUTH EVERY 8 HOURS AS NEEDED   lamoTRIgine (LAMICTAL) 100 MG tablet Take 100 mg by mouth daily.   minoxidil (LONITEN) 2.5 MG tablet Take 1.25 mg by mouth 2 (two) times daily.   norgestimate-ethinyl estradiol (ORTHO-CYCLEN) 0.25-35 MG-MCG tablet TAKE 1 TABLET BY MOUTH EVERY DAY   RETIN-A 0.05 % cream Apply 1 application topically at bedtime.   spironolactone (ALDACTONE) 100 MG tablet Take 100 mg by mouth daily.   traZODone (DESYREL) 100 MG tablet Take 100 mg by mouth at bedtime as needed.   vortioxetine HBr (TRINTELLIX) 10 MG TABS tablet Take 1 tablet (10 mg total) by mouth daily.   VYVANSE 30 MG capsule Take 30 mg by mouth every morning.    PHQ 2/9 Scores 06/29/2021 05/16/2021 03/30/2021 08/25/2020  PHQ - 2 Score 5 6 6 2   PHQ- 9 Score 9 16 19 5     GAD 7 : Generalized Anxiety Score 06/29/2021 05/16/2021 03/30/2021 08/25/2020  Nervous, Anxious, on Edge 3 1 1 1   Control/stop worrying 3 3 1 1   Worry too much - different things 3 3 2 1   Trouble relaxing 1 0 1 0  Restless 0 0 1 0  Easily annoyed or irritable 1 2 2 1   Afraid - awful might happen 0 1 0 1  Total GAD 7 Score 11 10 8 5   Anxiety Difficulty - Not difficult at all Not difficult at all -    BP Readings from Last 3 Encounters:  06/29/21 104/74  05/16/21 122/74  03/30/21 124/70    Physical Exam Vitals and nursing note reviewed.  Constitutional:      General: She is not in acute distress.    Appearance: She  is well-developed.  HENT:     Head: Normocephalic and atraumatic.     Right Ear: Tympanic membrane and ear canal normal.     Left Ear: Tympanic membrane and ear canal normal.     Nose:     Right Sinus: No maxillary sinus tenderness.     Left Sinus: No maxillary sinus tenderness.  Eyes:     General: No scleral icterus.       Right eye: No discharge.        Left eye: No discharge.     Conjunctiva/sclera: Conjunctivae normal.  Neck:     Thyroid: No thyromegaly.     Vascular: No carotid bruit.  Cardiovascular:     Rate  and Rhythm: Normal rate and regular rhythm.     Pulses: Normal pulses.     Heart sounds: Normal heart sounds.  Pulmonary:     Effort: Pulmonary effort is normal. No respiratory distress.     Breath sounds: No wheezing.  Chest:  Breasts:    Right: No mass, nipple discharge, skin change or tenderness.     Left: No mass, nipple discharge, skin change or tenderness.  Abdominal:     General: Bowel sounds are normal.     Palpations: Abdomen is soft.     Tenderness: There is no abdominal tenderness.  Genitourinary:    Labia:        Right: No tenderness, lesion or injury.        Left: No tenderness, lesion or injury.      Vagina: Normal.     Cervix: Normal.     Uterus: Normal.      Adnexa: Right adnexa normal and left adnexa normal.  Musculoskeletal:     Cervical back: Normal range of motion. No erythema.     Right lower leg: No edema.     Left lower leg: No edema.  Lymphadenopathy:     Cervical: No cervical adenopathy.  Skin:    General: Skin is warm and dry.     Findings: Erythema and rash present.  Neurological:     Mental Status: She is alert and oriented to person, place, and time.     Cranial Nerves: No cranial nerve deficit.     Sensory: No sensory deficit.     Deep Tendon Reflexes: Reflexes are normal and symmetric.  Psychiatric:        Attention and Perception: Attention normal.        Mood and Affect: Mood normal.    Wt Readings from Last 3  Encounters:  06/29/21 181 lb (82.1 kg)  05/16/21 187 lb 3.2 oz (84.9 kg)  03/30/21 182 lb 12.8 oz (82.9 kg)    BP 104/74    Pulse 84    Ht 5\' 2"  (1.575 m)    Wt 181 lb (82.1 kg)    LMP 06/22/2021    SpO2 97%    BMI 33.11 kg/m   Assessment and Plan: 1. Annual physical exam Normal exam. Continue to work on diet and exercise with healthy diet. - CBC with Differential/Platelet - Comprehensive metabolic panel - Lipid panel - TSH  2. Encounter for screening for cervical cancer Obtained today. STI testing declined - Cytology - PAP  3. Moderate episode of recurrent major depressive disorder ([place]) Depression is improved on current medication - Prozac, gabapentin, Lamictal, trazodone, Trintellix and Vyvanse.  4. Tinea corporis Local care instructions given. - nystatin ointment (MYCOSTATIN); Apply 1 application topically 2 (two) times daily.  Dispense: 30 g; Refill: 0   Partially dictated using Editor, commissioning. Any errors are unintentional.  [name], MD [facility]  06/29/2021

## 2021-06-30 ENCOUNTER — Telehealth: Payer: Self-pay | Admitting: Internal Medicine

## 2021-06-30 LAB — COMPREHENSIVE METABOLIC PANEL
ALT: 12 IU/L (ref 0–32)
AST: 18 IU/L (ref 0–40)
Albumin/Globulin Ratio: 1.8 (ref 1.2–2.2)
Albumin: 4.8 g/dL (ref 3.8–4.8)
Alkaline Phosphatase: 63 IU/L (ref 44–121)
BUN/Creatinine Ratio: 18 (ref 9–23)
BUN: 13 mg/dL (ref 6–20)
Bilirubin Total: 0.4 mg/dL (ref 0.0–1.2)
CO2: 22 mmol/L (ref 20–29)
Calcium: 10 mg/dL (ref 8.7–10.2)
Chloride: 98 mmol/L (ref 96–106)
Creatinine, Ser: 0.74 mg/dL (ref 0.57–1.00)
Globulin, Total: 2.7 g/dL (ref 1.5–4.5)
Glucose: 83 mg/dL (ref 70–99)
Potassium: 4.6 mmol/L (ref 3.5–5.2)
Sodium: 136 mmol/L (ref 134–144)
Total Protein: 7.5 g/dL (ref 6.0–8.5)
eGFR: 110 mL/min/{1.73_m2} (ref >59)

## 2021-06-30 LAB — CBC WITH DIFFERENTIAL/PLATELET
Basophils Absolute: 0 x10E3/uL (ref 0.0–0.2)
Basos: 1 %
EOS (ABSOLUTE): 0.1 x10E3/uL (ref 0.0–0.4)
Eos: 1 %
Hematocrit: 42.1 % (ref 34.0–46.6)
Hemoglobin: 13.9 g/dL (ref 11.1–15.9)
Immature Grans (Abs): 0 x10E3/uL (ref 0.0–0.1)
Immature Granulocytes: 0 %
Lymphocytes Absolute: 1.7 x10E3/uL (ref 0.7–3.1)
Lymphs: 21 %
MCH: 28.8 pg (ref 26.6–33.0)
MCHC: 33 g/dL (ref 31.5–35.7)
MCV: 87 fL (ref 79–97)
Monocytes Absolute: 0.5 x10E3/uL (ref 0.1–0.9)
Monocytes: 6 %
Neutrophils Absolute: 5.6 x10E3/uL (ref 1.4–7.0)
Neutrophils: 71 %
Platelets: 344 x10E3/uL (ref 150–450)
RBC: 4.82 x10E6/uL (ref 3.77–5.28)
RDW: 12.8 % (ref 11.7–15.4)
WBC: 7.8 x10E3/uL (ref 3.4–10.8)

## 2021-06-30 LAB — LIPID PANEL
Chol/HDL Ratio: 3 ratio (ref 0.0–4.4)
Cholesterol, Total: 259 mg/dL — ABNORMAL HIGH (ref 100–199)
HDL: 87 mg/dL
LDL Chol Calc (NIH): 141 mg/dL — ABNORMAL HIGH (ref 0–99)
Triglycerides: 179 mg/dL — ABNORMAL HIGH (ref 0–149)
VLDL Cholesterol Cal: 31 mg/dL (ref 5–40)

## 2021-06-30 LAB — TSH: TSH: 1.92 u[IU]/mL (ref 0.450–4.500)

## 2021-06-30 LAB — CYTOLOGY - PAP
Comment: NEGATIVE
Diagnosis: NEGATIVE
High risk HPV: NEGATIVE

## 2021-06-30 NOTE — Telephone Encounter (Signed)
Copied from [place] [phone]. Topic: General - Other >> Jun 30, 2021  9:27 AM Christine Arellano A wrote: Reason for CRM: The patient has been notified of the results of their cholesterol labs  The patient would like to know if they now qualify for coverage of a previously denied weight loss injectable medication   Please contact further when possible

## 2021-06-30 NOTE — Telephone Encounter (Signed)
Spoke to pt let her know that she would have to have diabetes. Pt verbalized understanding.  KP

## 2021-07-06 ENCOUNTER — Telehealth: Payer: Self-pay | Admitting: Internal Medicine

## 2021-07-06 NOTE — Telephone Encounter (Signed)
Pt scheduled to see Dr. Zigmund Daniel for back pain.  KP

## 2021-07-06 NOTE — Telephone Encounter (Signed)
Copied from [place] [id number]. Topic: General - Other >> Jul 06, 2021 12:41 PM Leward Quan A wrote: Reason for CRM: Patient called in to speak to Dr Army Melia stated that she was seen on 06/29/21 because she is having back and knee issues say that her knee issues were addressed but want to know from Dr Army Melia what to do about her back pain. Per patient she thinks its her sciatic nerve. Please advise and call Ph# [phone]

## 2021-07-13 DIAGNOSIS — M25561 Pain in right knee: Secondary | ICD-10-CM | POA: Diagnosis not present

## 2021-07-13 DIAGNOSIS — S83411A Sprain of medial collateral ligament of right knee, initial encounter: Secondary | ICD-10-CM | POA: Diagnosis not present

## 2021-07-18 DIAGNOSIS — S83521A Sprain of posterior cruciate ligament of right knee, initial encounter: Secondary | ICD-10-CM | POA: Diagnosis not present

## 2021-07-19 ENCOUNTER — Encounter: Payer: Medicaid Other | Admitting: Family Medicine

## 2021-07-25 DIAGNOSIS — M25461 Effusion, right knee: Secondary | ICD-10-CM | POA: Diagnosis not present

## 2021-07-25 DIAGNOSIS — M25561 Pain in right knee: Secondary | ICD-10-CM | POA: Diagnosis not present

## 2021-07-26 ENCOUNTER — Encounter: Payer: Medicaid Other | Admitting: Family Medicine

## 2021-07-27 ENCOUNTER — Telehealth: Payer: Self-pay | Admitting: Internal Medicine

## 2021-07-27 ENCOUNTER — Other Ambulatory Visit: Payer: Self-pay

## 2021-07-27 DIAGNOSIS — G8929 Other chronic pain: Secondary | ICD-10-CM

## 2021-07-27 NOTE — Telephone Encounter (Signed)
Discussed with pt. Informed Rhuem usually will not see you unless labs are elevated or abnormal. Told her we can TRY to send referral and they will call her if they can see her based of diagnosis and labs. ?

## 2021-07-27 NOTE — Telephone Encounter (Signed)
Referral Request - Has patient seen PCP for this complaint? yes ?*If NO, is insurance requiring patient see PCP for this issue before PCP can refer them? ?Referral for which specialty: rheumatology ?Preferred provider/office: any ?Reason for referral: pt states she has ongoing all over joint pain and the dr is aware. Pt declined to make appt at this time.  She asked that I first ask the dr if she will refer her, since this is something she has been dealing with.  Pt states the last orthopedic she went to advised her to see rheumatology. ? ?

## 2021-07-28 DIAGNOSIS — L720 Epidermal cyst: Secondary | ICD-10-CM | POA: Diagnosis not present

## 2021-07-28 DIAGNOSIS — L700 Acne vulgaris: Secondary | ICD-10-CM | POA: Diagnosis not present

## 2021-07-28 DIAGNOSIS — L648 Other androgenic alopecia: Secondary | ICD-10-CM | POA: Diagnosis not present

## 2021-07-28 DIAGNOSIS — D239 Other benign neoplasm of skin, unspecified: Secondary | ICD-10-CM | POA: Diagnosis not present

## 2021-07-29 DIAGNOSIS — F429 Obsessive-compulsive disorder, unspecified: Secondary | ICD-10-CM | POA: Diagnosis not present

## 2021-07-29 DIAGNOSIS — F332 Major depressive disorder, recurrent severe without psychotic features: Secondary | ICD-10-CM | POA: Diagnosis not present

## 2021-08-03 ENCOUNTER — Encounter: Payer: Self-pay | Admitting: Family Medicine

## 2021-08-03 ENCOUNTER — Ambulatory Visit: Payer: Medicaid Other | Admitting: Family Medicine

## 2021-08-03 ENCOUNTER — Other Ambulatory Visit: Payer: Self-pay

## 2021-08-03 VITALS — BP 102/72 | HR 76 | Ht 62.0 in | Wt 192.0 lb

## 2021-08-03 DIAGNOSIS — M4727 Other spondylosis with radiculopathy, lumbosacral region: Secondary | ICD-10-CM

## 2021-08-03 MED ORDER — MELOXICAM 15 MG PO TABS: 15.0000 mg | ORAL_TABLET | Freq: Every day | ORAL | 0 refills | Status: DC

## 2021-08-03 MED ORDER — TIZANIDINE HCL 4 MG PO TABS: 4.0000 mg | ORAL_TABLET | Freq: Every day | ORAL | 0 refills | Status: DC

## 2021-08-04 NOTE — Progress Notes (Signed)
?  ? ?  Primary Care / Sports Medicine Office Visit ? ?Patient Information:  ?Patient ID: Christine Arellano, female DOB: Sep 07, 1990 Age: 31 y.o. MRN: [id number]  ? ?Christine Arellano is a pleasant 31 y.o. female presenting with the following: ? ?Chief Complaint  ?Patient presents with  ? Back Pain  ?  Had back pain since 2008 getting worse, shooting and aching pain, hurts when standing or sitting to long. Laying on hard surface hurts   ? ? ?Vitals:  ? 08/03/21 0832  ?BP: 102/72  ?Pulse: 76  ?SpO2: 98%  ? ?Vitals:  ? 08/03/21 0832  ?Weight: 192 lb (87.1 kg)  ?Height: '5\' 2"'$  (1.575 m)  ? ?Body mass index is 35.12 kg/m?. ? ?No results found.  ? ?Independent interpretation of notes and tests performed by another provider:  ? ?Independent interpretation of lumbar spine x-rays from 05/17/2021 reveal hyperlordotic curvature, subtle intervertebral narrowing at L5-S1, additionally focal sclerosis at L5-S1 facets consistent with subtle degenerative changes, foramina mildly narrowed at this level, no acute osseous process noted ? ?Procedures performed:  ? ?None ? ?Pertinent History, Exam, Impression, and Recommendations:  ? ?Lumbosacral spondylosis with radiculopathy ?Central low back pain with radiation into bilateral buttocks equally ongoing since 2008, atraumatic in onset.  States that she has the symptoms with prolonged sitting or prolonged standing/weightbearing. ? ?Examination reveals left greater than right SI joint tenderness, diffuse generalized paraspinal lower lumbar tenderness, right greater than left greater trochanteric tenderness, positive straight leg raise bilaterally, positive Faber bilaterally, negative FADIR bilaterally.  Kemp test bilaterally equivocal. ? ?Given her stated chronic symptomatology, findings today, her clinical picture is most consistent with subtle lumbosacral spondylosis with associated radiculopathy primarily in the L5-S1 distribution.  I reviewed treatment strategies and she is amenable to formal  physical therapy, initiation of scheduled meloxicam, as needed tizanidine, and close follow-up in 6 weeks.  If suboptimal response noted, can consider local ultrasound-guided injections, modification of pharmacotherapy, and advanced imaging. ? ?Chronic condition, symptomatic, independent interpretation x-rays, Rx management  ? ?Orders & Medications ?Meds ordered this encounter  ?Medications  ? tiZANidine (ZANAFLEX) 4 MG tablet  ?  Sig: Take 1 tablet (4 mg total) by mouth at bedtime.  ?  Dispense:  30 tablet  ?  Refill:  0  ? meloxicam (MOBIC) 15 MG tablet  ?  Sig: Take 1 tablet (15 mg total) by mouth daily.  ?  Dispense:  45 tablet  ?  Refill:  0  ? ?Orders Placed This Encounter  ?Procedures  ? Ambulatory referral to Physical Therapy  ?  ? ?Return in about 6 weeks (around 09/14/2021).  ?  ? ?Montel Culver, MD ? ? Primary Care Sports Medicine ?[facility] ?[place]  ? ?

## 2021-08-04 NOTE — Assessment & Plan Note (Signed)
Central low back pain with radiation into bilateral buttocks equally ongoing since 2008, atraumatic in onset.  States that she has the symptoms with prolonged sitting or prolonged standing/weightbearing. ? ?Examination reveals left greater than right SI joint tenderness, diffuse generalized paraspinal lower lumbar tenderness, right greater than left greater trochanteric tenderness, positive straight leg raise bilaterally, positive Faber bilaterally, negative FADIR bilaterally.  Kemp test bilaterally equivocal. ? ?Given her stated chronic symptomatology, findings today, her clinical picture is most consistent with subtle lumbosacral spondylosis with associated radiculopathy primarily in the L5-S1 distribution.  I reviewed treatment strategies and she is amenable to formal physical therapy, initiation of scheduled meloxicam, as needed tizanidine, and close follow-up in 6 weeks.  If suboptimal response noted, can consider local ultrasound-guided injections, modification of pharmacotherapy, and advanced imaging. ? ?Chronic condition, symptomatic, independent interpretation x-rays, Rx management ?

## 2021-08-04 NOTE — Patient Instructions (Addendum)
-   Dose meloxicam daily with food ?- Can dose nightly tizanidine on an as-needed basis for muscle tightness pain ?- Start formal physical therapy with referral provided, can contact the number below for expedited scheduling ?- Return for follow-up in 6 weeks, contact her office for any questions between now and then ? ?[facility]: ? ?[place]:  [phone] ? ?: [phone] ?

## 2021-08-05 DIAGNOSIS — R5382 Chronic fatigue, unspecified: Secondary | ICD-10-CM | POA: Diagnosis not present

## 2021-08-05 DIAGNOSIS — E538 Deficiency of other specified B group vitamins: Secondary | ICD-10-CM | POA: Diagnosis not present

## 2021-08-05 DIAGNOSIS — M791 Myalgia, unspecified site: Secondary | ICD-10-CM | POA: Diagnosis not present

## 2021-08-05 DIAGNOSIS — M2550 Pain in unspecified joint: Secondary | ICD-10-CM | POA: Diagnosis not present

## 2021-08-07 LAB — VITAMIN B12: Vitamin B-12: 189

## 2021-08-09 ENCOUNTER — Telehealth: Payer: Self-pay | Admitting: Internal Medicine

## 2021-08-09 NOTE — Telephone Encounter (Signed)
Patient informed by mychart message.

## 2021-08-09 NOTE — Telephone Encounter (Signed)
Copied from [place] [id number]. Topic: General - Other ?>> Aug 09, 2021  1:48 PM [name], [place] wrote: ?Reason for CRM:pt called in , says was told by rheumatoid Dr that B12 is low and she is asking can something, like vitamins be prescribed to her for that. Please call back ?

## 2021-08-11 ENCOUNTER — Ambulatory Visit: Payer: Medicaid Other | Attending: Family Medicine | Admitting: Physical Therapy

## 2021-08-11 ENCOUNTER — Encounter: Payer: Self-pay | Admitting: Physical Therapy

## 2021-08-11 ENCOUNTER — Other Ambulatory Visit: Payer: Self-pay

## 2021-08-11 DIAGNOSIS — M4727 Other spondylosis with radiculopathy, lumbosacral region: Secondary | ICD-10-CM | POA: Diagnosis not present

## 2021-08-11 DIAGNOSIS — M545 Low back pain, unspecified: Secondary | ICD-10-CM

## 2021-08-11 DIAGNOSIS — M2560 Stiffness of unspecified joint, not elsewhere classified: Secondary | ICD-10-CM | POA: Diagnosis not present

## 2021-08-11 DIAGNOSIS — M6281 Muscle weakness (generalized): Secondary | ICD-10-CM | POA: Diagnosis not present

## 2021-08-12 ENCOUNTER — Encounter: Payer: Self-pay | Admitting: Physical Therapy

## 2021-08-12 NOTE — Therapy (Signed)
[place] ?[facility] ?[address]. [place], [place], [address] ?Phone: [phone]   Fax:  [phone] ? ?Physical Therapy Evaluation ? ?Patient Details  ?Name: Christine Arellano ?MRN: [id number] ?Date of Birth: 12-24-1990 ?Referring Provider (PT): Dr. Rosette Reveal ? ? ?Encounter Date: 08/11/2021 ? ? PT End of Session - 08/12/21 1007   ? ? Visit Number 1   ? Number of Visits 16   ? Date for PT Re-Evaluation 10/06/21   ? Authorization - Visit Number 1   ? Authorization - Number of Visits 10   ? PT Start Time 1513   ? PT Stop Time 1610   ? PT Time Calculation (min) 57 min   ? Activity Tolerance Patient tolerated treatment well;Patient limited by pain   ? ?  ?  ? ?  ? ? ?Past Medical History:  ?Diagnosis Date  ? Acute midline thoracic back pain 05/05/2019  ? Anxiety   ? Depression   ? GERD (gastroesophageal reflux disease)   ? Headache   ? History of IBS   ? Insomnia   ? Knee pain 07/06/2016  ? Mood disorder ([place]) 03/21/2016  ? normal karyotype after Abnormal chromosomal and genetic finding on antenatal screening mother   ? S/p amnio 06/24/2018- 46XX  SMA and CF pending   ? S/P cesarean section 02/24/2015  ? ? ?Past Surgical History:  ?Procedure Laterality Date  ? arthroscopic knee Right 2008  ? CESAREAN SECTION  2014  ? CESAREAN SECTION N/A 02/24/2015  ? Procedure: CESAREAN SECTION;  Surgeon: Rubie Maid, MD;  Location: [facility] OR[facility];  Service: Obstetrics;  Laterality: N/A;  ? CESAREAN SECTION N/A 05/09/2017  ? Procedure: REPEAT CESAREAN SECTION;  Surgeon: Rubie Maid, MD;  Location: [facility] OR[facility];  Service: Obstetrics;  Laterality: N/A;  ? HEMORROIDECTOMY    ? THERAPEUTIC ABORTION  08/2018  ? fetal CNS abnormalities  ? TONSILLECTOMY    ? WISDOM TOOTH EXTRACTION    ? ? ?There were no vitals filed for this visit. ? ? ? Subjective Assessment - 08/12/21 0956   ? ? Subjective Pt. referred to PT wtih chronic c/o low back pain with radicular symptoms into B buttocks.  Pt. currently reports 3/10 low  back pain at rest and reports chronic h/o neck/back/R knee pain.  No h/o PT or injections to low back.  Pt. recently diagnosed with Fibromyalgia after MD appt. with Rheumatologist.  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  Pt. states R knee will occasionally give out.  Pt. is considering possible surgery for R knee in future.  Pt. reports she is pain limited with all ADL and uses meds, heat and ice.   ? 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  Pt. has 3 kids (13 y/o, 55 y/o, 39 y/o).  [employer] driver.   ? Limitations Sitting;House hold activities;Lifting;Standing;Walking   ? Diagnostic tests X-ray.  No h/o MRI   ? Patient Stated Goals Decrease back pain/ improve functional mobility.   ? Currently in Pain? Yes   ? Pain Score 3    ? Pain Location Back   ? Pain Orientation Lower   ? Pain Descriptors / Indicators Aching   ? Pain Type Chronic pain   ? Pain Radiating Towards B LE radicular symptoms.   ? Pain Onset More than a month ago   ? Pain Frequency Constant   ? Aggravating Factors  increase activity   ? Pain  Relieving Factors lying in fetal position/ heat and ice/ medications.   ? ?  ?  ? ?  ? ? ? ? ? [facility] PT Assessment - 08/12/21 0001   ? ?  ? Assessment  ? Medical Diagnosis Lumbosacral spondylosis with radiculopathy   ? Referring Provider (PT) Dr. Rosette Reveal   ? Onset Date/Surgical Date 05/29/12   ? Prior Therapy No   ?  ? Precautions  ? Precautions None   ?  ? Home Environment  ? Living Environment Private residence   ?  ? Prior Function  ? Level of Independence Independent   ?  ? Cognition  ? Overall Cognitive Status Within Functional Limits for tasks assessed   ? ?  ?  ? ?  ? ? ?See clinical impression ? ? ?Objective measurements completed on examination: See above findings.  ? ? ? ? ?Moist heat to lumbar region in prone position. ?Prone position: hypervolt to mid-thoracic/lumbar paraspinals/ superior glut.   ? ?Discussed use of TENS unit (next  tx.) ? ? ? ? ? ? PT Long Term Goals - 08/12/21 1023   ? ?  ? PT LONG TERM GOAL #1  ? Title Pt. will increase FOTO to 57 to improve pain-free mobility.   ? Baseline Initial FOTO: 50   ? Time 8   ? Period Weeks   ? Status New   ? Target Date 10/06/21   ?  ? PT LONG TERM GOAL #2  ? Title Pt. independent with HEP to increase TrA muscle activation/ lumbar extension to decrease LE radicular symptoms.   ? Baseline B LE radicular symtoms.  Pain limited lumbar extension.  Pt. currently not participating with HEP.   ? Time 8   ? Period Weeks   ? Status New   ? Target Date 10/06/21   ?  ? PT LONG TERM GOAL #3  ? Title Pt. able to complete a 45 minute ther.ex. program with no increase c/o low back or radicular symptoms to improve functional activity.   ? Baseline pt. currently not participating with HEP   ? Time 8   ? Period Weeks   ? Status New   ? Target Date 10/06/21   ?  ? PT LONG TERM GOAL #4  ? Title Pt. will report no tenderness with palpation over lumbar paraspinals to improve pain-free mobility .   ? Baseline Moderate lumbar parapinal tendenress/ minimal hypomobility.   ? Time 8   ? Period Weeks   ? Status New   ? Target Date 10/06/21   ? ?  ?  ? ?  ? ? ? ? ? ? ? ? ? Plan - 08/12/21 1009   ? ? Clinical Impression Statement Pt is a pleasant 31 y/o female with chronic c/o low back pain with B LE radicular symptoms.  Pt. recently diagnosed with fibromyalgia and c/o generalized back pain.  Pt. reports 3/10 low back pain currently at rest in seated posture.  Pt. prefers fetal position to manage pain symptoms.  Lumbar AROM WNL all planes with increase pain symptoms during extension in standing.  B LE AROM WNL and strength grossly 5/5 MMT except hip flexion 4/5 MMT (slight increase in low back symptoms).  Pt. is a sidesleeper with proper use of body pillow support.  (-) SLR, good hamstring flexibility.  Good hip mobility noted.  Minimal lumbar hypomobility noted in low thoracic/lumbar spine during central PA mobs.   Generalized muscle tenderness in lumbar paraspinals/ superior glut  musculature.  FOTO: initial 50/ goal 57.  Generalized deconditioning noted.  Pt. will benefit from skilled PT services to increase core stability/ pain-free lumbar mobility to improve household tasks/ ADL.   ? Stability/Clinical Decision Making Evolving/Moderate complexity   ? Clinical Decision Making Moderate   ? Rehab Potential Good   ? PT Frequency 2x / week   ? PT Duration 8 weeks   ? PT Treatment/Interventions ADLs/Self Care Home Management;Aquatic Therapy;Cryotherapy;Moist Heat;Electrical Stimulation;Gait training;Stair training;Functional mobility training;Neuromuscular re-education;Balance training;Therapeutic exercise;Therapeutic activities;Patient/family education;Manual techniques;Passive range of motion;Dry needling;Joint Manipulations;Spinal Manipulations   ? PT Next Visit Plan Issue HEP   ? ?  ?  ? ?  ? ? ?Patient will benefit from skilled therapeutic intervention in order to improve the following deficits and impairments:  Decreased mobility, Hypomobility, Obesity, Improper body mechanics, Decreased range of motion, Decreased activity tolerance, Decreased strength, Pain, Postural dysfunction ? ?Visit Diagnosis: ?Lumbosacral spondylosis with radiculopathy ? ?Lumbar back pain ? ?Joint stiffness ? ?Muscle weakness (generalized) ? ? ? ? ?Problem List ?Patient Active Problem List  ? Diagnosis Date Noted  ? Lumbosacral spondylosis with radiculopathy 08/03/2021  ? Lumbar back pain 03/30/2021  ? BMI 33.0-33.9,adult 03/30/2021  ? Migraine headache without aura 01/26/2021  ? Inverted nipple 05/05/2019  ? Moderate episode of recurrent major depressive disorder ([place]) 05/05/2019  ? 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  ? Generalized anxiety disorder 03/04/2019  ? Alopecia of scalp 03/04/2019  ? Dry eye syndrome of both eyes 10/18/2017  ? Adult acne 05/03/2015  ? ?Pura Spice, PT, DPT # [id number] ?08/12/2021, 10:37 AM ? ?[place] ?[facility] ?[address] [place], [place], [address] ?Phone: [phone]   Fax:  [phone] ? ?Name: Christine Arellano ?MRN: [id number] ?Date of Birth: 12-01-1990 ? ? ?

## 2021-08-17 ENCOUNTER — Encounter: Payer: Medicaid Other | Admitting: Physical Therapy

## 2021-08-19 ENCOUNTER — Encounter: Payer: Self-pay | Admitting: Physical Therapy

## 2021-08-19 ENCOUNTER — Ambulatory Visit: Payer: Medicaid Other | Admitting: Physical Therapy

## 2021-08-19 ENCOUNTER — Other Ambulatory Visit: Payer: Self-pay

## 2021-08-19 DIAGNOSIS — M545 Low back pain, unspecified: Secondary | ICD-10-CM

## 2021-08-19 DIAGNOSIS — M4727 Other spondylosis with radiculopathy, lumbosacral region: Secondary | ICD-10-CM | POA: Diagnosis not present

## 2021-08-19 DIAGNOSIS — M6281 Muscle weakness (generalized): Secondary | ICD-10-CM

## 2021-08-19 DIAGNOSIS — M2560 Stiffness of unspecified joint, not elsewhere classified: Secondary | ICD-10-CM

## 2021-08-19 NOTE — Therapy (Signed)
[place] ?[facility] ?[address]. [place], [place], [address] ?Phone: [phone]   Fax:  [phone] ? ?Physical Therapy Treatment ? ?Patient Details  ?Name: Christine Arellano ?MRN: [id number] ?Date of Birth: 05/15/1991 ?Referring Provider (PT): Dr. Rosette Reveal ? ? ?Encounter Date: 08/19/2021 ? ? PT End of Session - 08/19/21 0957   ? ? Visit Number 2   ? Number of Visits 16   ? Date for PT Re-Evaluation 10/06/21   ? Authorization - Visit Number 2   ? Authorization - Number of Visits 10   ? PT Start Time [phone]   ? PT Stop Time 1038   ? PT Time Calculation (min) 59 min   ? Activity Tolerance Patient tolerated treatment well   ? Behavior During Therapy [facility] for tasks assessed/performed   ? ?  ?  ? ?  ? ? ?Past Medical History:  ?Diagnosis Date  ? Acute midline thoracic back pain 05/05/2019  ? Anxiety   ? Depression   ? GERD (gastroesophageal reflux disease)   ? Headache   ? History of IBS   ? Insomnia   ? Knee pain 07/06/2016  ? Mood disorder ([place]) 03/21/2016  ? normal karyotype after Abnormal chromosomal and genetic finding on antenatal screening mother   ? S/p amnio 06/24/2018- 46XX  SMA and CF pending   ? S/P cesarean section 02/24/2015  ? ? ?Past Surgical History:  ?Procedure Laterality Date  ? arthroscopic knee Right 2008  ? CESAREAN SECTION  2014  ? CESAREAN SECTION N/A 02/24/2015  ? Procedure: CESAREAN SECTION;  Surgeon: Rubie Maid, MD;  Location: [facility] OR[facility];  Service: Obstetrics;  Laterality: N/A;  ? CESAREAN SECTION N/A 05/09/2017  ? Procedure: REPEAT CESAREAN SECTION;  Surgeon: Rubie Maid, MD;  Location: [facility] OR[facility];  Service: Obstetrics;  Laterality: N/A;  ? HEMORROIDECTOMY    ? THERAPEUTIC ABORTION  08/2018  ? fetal CNS abnormalities  ? TONSILLECTOMY    ? WISDOM TOOTH EXTRACTION    ? ? ?There were no vitals filed for this visit. ? ? Subjective Assessment - 08/19/21 1510   ? ? Subjective Pt. reports 2-3/10 low back pain and stiffness prior to tx.   Pt. states she is stiff in  upper back/ neck.   ? 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  Pt. has 3 kids (23 y/o, 4 y/o, 28 y/o).  [employer] driver.   ? Limitations Sitting;House hold activities;Lifting;Standing;Walking   ? Diagnostic tests X-ray.  No h/o MRI   ? Patient Stated Goals Decrease back pain/ improve functional mobility.   ? Currently in Pain? Yes   ? Pain Score 3    ? Pain Location Back   ? Pain Orientation Lower   ? Pain Onset More than a month ago   ? ?  ?  ? ?  ? ? ? ?Ther.ex.: ? ?Nustep L3 10 min. B UE/LE (discussed week activities/ back stiffness) ?Supine TrA ex. Program (see pages #1-3)- handouts issued.  Good TrA muscle activation.   ? ?Manual tx.: ? ?Supine LE/lumbar generalized stretching (good hamstring/ trunk rotn. Mobility).   ?Prone STM to low thoracic/lumbar paraspinals (use of Hypervolt)- tolerates well ? ?Trial TENS unit at sensory set-up.  Modulation on low back protocol.  7.44m intensity.  IFC to lumbar spine.  Pt. Instructed in proper set-up and demonstrates use.   ? ? ? PT Education - 08/19/21 1635   ? ? Education Details  Issued pages #1-3 of TrA ex. program.  Issued trial TENS unit for pain mgmt.   ? Person(s) Educated Patient   ? Methods Explanation;Demonstration;Handout   ? Comprehension Verbalized understanding;Returned demonstration   ? ?  ?  ? ?  ? ? ? ? ? ? PT Long Term Goals - 08/12/21 1023   ? ?  ? PT LONG TERM GOAL #1  ? Title Pt. will increase FOTO to 57 to improve pain-free mobility.   ? Baseline Initial FOTO: 50   ? Time 8   ? Period Weeks   ? Status New   ? Target Date 10/06/21   ?  ? PT LONG TERM GOAL #2  ? Title Pt. independent with HEP to increase TrA muscle activation/ lumbar extension to decrease LE radicular symptoms.   ? Baseline B LE radicular symtoms.  Pain limited lumbar extension.  Pt. currently not participating with HEP.   ? Time 8   ? Period Weeks   ? Status New   ? Target Date 10/06/21   ?  ? PT LONG TERM GOAL #3  ? Title Pt. able to  complete a 45 minute ther.ex. program with no increase c/o low back or radicular symptoms to improve functional activity.   ? Baseline pt. currently not participating with HEP   ? Time 8   ? Period Weeks   ? Status New   ? Target Date 10/06/21   ?  ? PT LONG TERM GOAL #4  ? Title Pt. will report no tenderness with palpation over lumbar paraspinals to improve pain-free mobility .   ? Baseline Moderate lumbar parapinal tendenress/ minimal hypomobility.   ? Time 8   ? Period Weeks   ? Status New   ? Target Date 10/06/21   ? ?  ?  ? ?  ? ? ? ? ? ? ? ? Plan - 08/19/21 1637   ? ? Clinical Impression Statement Pt. demonstrates good TrA muscle activation in supine position and able to progress to page #2 of core stability (bridging).  Several areas of muscle tightness/ trigger points noted in low thoracic/ lumbar paraspinals during STM/ use of hypervolt.  Pt. instructed in use of home TENS (sensory setting) to manage pain during daily household tasks.  Pt. instructed to contact PT if any worsening in symptoms or questions.   ? Stability/Clinical Decision Making Evolving/Moderate complexity   ? Clinical Decision Making Moderate   ? Rehab Potential Good   ? PT Frequency 2x / week   ? PT Duration 8 weeks   ? PT Treatment/Interventions ADLs/Self Care Home Management;Aquatic Therapy;Cryotherapy;Moist Heat;Electrical Stimulation;Gait training;Stair training;Functional mobility training;Neuromuscular re-education;Balance training;Therapeutic exercise;Therapeutic activities;Patient/family education;Manual techniques;Passive range of motion;Dry needling;Joint Manipulations;Spinal Manipulations   ? PT Next Visit Plan Discuss TENS unit and HEP progression.   ? ?  ?  ? ?  ? ? ?Patient will benefit from skilled therapeutic intervention in order to improve the following deficits and impairments:  Decreased mobility, Hypomobility, Obesity, Improper body mechanics, Decreased range of motion, Decreased activity tolerance, Decreased  strength, Pain, Postural dysfunction ? ?Visit Diagnosis: ?Lumbosacral spondylosis with radiculopathy ? ?Lumbar back pain ? ?Joint stiffness ? ?Muscle weakness (generalized) ? ? ? ? ?Problem List ?Patient Active Problem List  ? Diagnosis Date Noted  ? Lumbosacral spondylosis with radiculopathy 08/03/2021  ? Lumbar back pain 03/30/2021  ? BMI 33.0-33.9,adult 03/30/2021  ? Migraine headache without aura 01/26/2021  ? Inverted nipple 05/05/2019  ? Moderate episode of recurrent major depressive disorder (  [place]) 05/05/2019  ? 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  ? Generalized anxiety disorder 03/04/2019  ? Alopecia of scalp 03/04/2019  ? Dry eye syndrome of both eyes 10/18/2017  ? Adult acne 05/03/2015  ? ?Pura Spice, PT, DPT # [phone] ?08/19/2021, 5:30 PM ? ?[place] ?[facility] ?[address] [place], [place], [address] ?Phone: [phone]   Fax:  [phone] ? ?Name: Christine Arellano ?MRN: [id number] ?Date of Birth: [birth year] ? ? ? ?

## 2021-08-23 ENCOUNTER — Ambulatory Visit: Payer: Self-pay | Admitting: *Deleted

## 2021-08-23 ENCOUNTER — Telehealth: Payer: Self-pay | Admitting: *Deleted

## 2021-08-23 ENCOUNTER — Telehealth: Payer: Self-pay

## 2021-08-23 NOTE — Telephone Encounter (Signed)
Spoke to pt told her to take an OTC B12 supplement and we would check B12 levels at next visit if needed. ? ?KP ?

## 2021-08-23 NOTE — Telephone Encounter (Signed)
Pt calling to inform Dr. Harless Nakayama that the Rheumatologist lab work showed low Vit B12, value was 189, pt is asking what to do. Asking if this is over the counter situation or if needs prescription. ?

## 2021-08-23 NOTE — Telephone Encounter (Signed)
Please review.  KP

## 2021-08-23 NOTE — Telephone Encounter (Signed)
Pt calling to discuss results, call dropped and am not able to get her back on phone. Voicemail left. ?

## 2021-08-23 NOTE — Telephone Encounter (Signed)
Call could not be transferred from agent, will try to reach pt. ?

## 2021-08-23 NOTE — Telephone Encounter (Signed)
Copied from [place] [id number]. Topic: Referral - Request for Referral ?>> Aug 23, 2021  8:33 AM [place], Nelva Bush wrote: ?Has patient seen PCP for this complaint? Yes.   Pt declined appointment stated has mentioned this to PCP. ?*If NO, is insurance requiring patient see PCP for this issue before PCP can refer them? ?Referral for which specialty: ENT  ?Preferred provider/office: N/A ?Reason for referral: deviated septum ?

## 2021-08-24 ENCOUNTER — Ambulatory Visit: Payer: Medicaid Other | Admitting: Physical Therapy

## 2021-08-24 NOTE — Telephone Encounter (Signed)
Called pt scheduled an appointment. ? ?KP ?

## 2021-08-25 ENCOUNTER — Telehealth: Payer: Self-pay | Admitting: Internal Medicine

## 2021-08-25 NOTE — Telephone Encounter (Signed)
Copied from [place] [phone]. Topic: Referral - Request for Referral ?>> Aug 25, 2021  3:04 PM Tessa Lerner A wrote: ?Has patient seen PCP for this complaint? Yes.   ?*If NO, is insurance requiring patient see PCP for this issue before PCP can refer them? ?Referral for which specialty: Dermatology  ?Preferred provider/office: [facility] - Dr. [facility]  ?Reason for referral: continued treatment for acne ?

## 2021-08-25 NOTE — Telephone Encounter (Signed)
Pt has an appointment on 08/30/2021. Will place referral at this appt if needed. ? ?KP ?

## 2021-08-26 ENCOUNTER — Ambulatory Visit: Payer: Medicaid Other | Admitting: Physical Therapy

## 2021-08-26 DIAGNOSIS — M2560 Stiffness of unspecified joint, not elsewhere classified: Secondary | ICD-10-CM

## 2021-08-26 DIAGNOSIS — M4727 Other spondylosis with radiculopathy, lumbosacral region: Secondary | ICD-10-CM | POA: Diagnosis not present

## 2021-08-26 DIAGNOSIS — M6281 Muscle weakness (generalized): Secondary | ICD-10-CM | POA: Diagnosis not present

## 2021-08-26 DIAGNOSIS — M545 Low back pain, unspecified: Secondary | ICD-10-CM | POA: Diagnosis not present

## 2021-08-26 NOTE — Therapy (Signed)
?OUTPATIENT PHYSICAL THERAPY TREATMENT NOTE ? ? ?Patient Name: Christine Arellano ?MRN: [id number] ?DOB:[birth year], 31 y.o., female ?Today's Date: 08/26/2021 ? ?PCP: Glean Hess, MD ?REFERRING PROVIDER: Montel Culver, MD ? ? PT End of Session - 08/26/21 0902   ? ? Visit Number 3   ? Number of Visits 16   ? Date for PT Re-Evaluation 10/06/21   ? Authorization - Visit Number 3   ? Authorization - Number of Visits 10   ? PT Start Time [phone]   ? PT Stop Time 1034   ? PT Time Calculation (min) 46 min   ? Activity Tolerance Patient tolerated treatment well   ? Behavior During Therapy [facility] for tasks assessed/performed   ? ?  ?  ? ?  ? ? ?Past Medical History:  ?Diagnosis Date  ? Acute midline thoracic back pain 05/05/2019  ? Anxiety   ? Depression   ? GERD (gastroesophageal reflux disease)   ? Headache   ? History of IBS   ? Insomnia   ? Knee pain 07/06/2016  ? Mood disorder ([place]) 03/21/2016  ? normal karyotype after Abnormal chromosomal and genetic finding on antenatal screening mother   ? S/p amnio 06/24/2018- 46XX  SMA and CF pending   ? S/P cesarean section 02/24/2015  ? ?Past Surgical History:  ?Procedure Laterality Date  ? arthroscopic knee Right 2008  ? CESAREAN SECTION  2014  ? CESAREAN SECTION N/A 02/24/2015  ? Procedure: CESAREAN SECTION;  Surgeon: Rubie Maid, MD;  Location: [facility] ORS;  Service: Obstetrics;  Laterality: N/A;  ? CESAREAN SECTION N/A 05/09/2017  ? Procedure: REPEAT CESAREAN SECTION;  Surgeon: Rubie Maid, MD;  Location: [facility] ORS;  Service: Obstetrics;  Laterality: N/A;  ? HEMORROIDECTOMY    ? THERAPEUTIC ABORTION  08/2018  ? fetal CNS abnormalities  ? TONSILLECTOMY    ? WISDOM TOOTH EXTRACTION    ? ?Patient Active Problem List  ? Diagnosis Date Noted  ? Lumbosacral spondylosis with radiculopathy 08/03/2021  ? Lumbar back pain 03/30/2021  ? BMI 33.0-33.9,adult 03/30/2021  ? Migraine headache without aura 01/26/2021  ? Inverted nipple 05/05/2019  ? Moderate episode of recurrent major depressive  disorder ([place]) 05/05/2019  ? 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  ? Generalized anxiety disorder 03/04/2019  ? Alopecia of scalp 03/04/2019  ? Dry eye syndrome of both eyes 10/18/2017  ? Adult acne 05/03/2015  ? ? ?REFERRING DIAG: Lumbosacral spondylosis with radiculopathy ? ?THERAPY DIAG:  ?Lumbosacral spondylosis with radiculopathy ? ?Lumbar back pain ? ?Joint stiffness ? ?Muscle weakness (generalized) ? ?PERTINENT HISTORY: See evaluation ? ?PRECAUTIONS: N/A ? ?SUBJECTIVE: Pt. States she has a lot going on this week.  Pt. Reports moderate low back pain and c/o sharp/ shooting symptoms.  ? ?PAIN:  ?Are you having pain? Yes: NPRS scale: 5/10 ?Pain location: low back ?Pain description: sharp/ achy/ shooting ?Aggravating factors: walking/ prolonged sitting/ stairs ?Relieving factors: lying in fetal position ? ? ? ? ?TODAY'S TREATMENT:  ?Ther.ex.: ?  ?Nustep L4 10 min. B UE/LE (discussed week activities/ back stiffness) ? ?Seated ball ex.: pelvic tilts/ marching/ alt. UE and LE/ LAQ/ scap. Retraction with GTB/ bilateral sh. ER with GTB/ shoulder extension with GTB 20x each.     ? ?Supine TrA ex. Program (see pages #1-3)- Reviewed progression of core ex..  Good TrA muscle activation with bridging/ bicycle kicks.   ?  ?Manual tx.: ?  ?Supine LE/lumbar generalized stretching (good hamstring/ trunk rotn. Mobility).   ? ?Prone STM to low thoracic/lumbar  paraspinals (use of Hypervolt)- tolerates well ?  ?Discussed Trial TENS unit (pt. Will bring next tx. Session) ? ? ?PATIENT EDUCATION: ?Education details: HEP ?Person educated: Patient ?Education method: Explanation, Demonstration, and Handouts ?Education comprehension: verbalized understanding and returned demonstration ? ? ?HOME EXERCISE PROGRAM: ?Issued pages #1-3 of TrA ex. program. Issued trial TENS unit for pain mgmt. ? ? ? ? PT Long Term Goals -   ? ?  ? PT LONG TERM GOAL #1  ? Title Pt. will increase FOTO to 57 to improve pain-free mobility.   ? Baseline Initial FOTO: 50   ?  Time 8   ? Period Weeks   ? Status New   ? Target Date 10/06/21   ?  ? PT LONG TERM GOAL #2  ? Title Pt. independent with HEP to increase TrA muscle activation/ lumbar extension to decrease LE radicular symptoms.   ? Baseline B LE radicular symtoms.  Pain limited lumbar extension.  Pt. currently not participating with HEP.   ? Time 8   ? Period Weeks   ? Status New   ? Target Date 10/06/21   ?  ? PT LONG TERM GOAL #3  ? Title Pt. able to complete a 45 minute ther.ex. program with no increase c/o low back or radicular symptoms to improve functional activity.   ? Baseline pt. currently not participating with HEP   ? Time 8   ? Period Weeks   ? Status New   ? Target Date 10/06/21   ?  ? PT LONG TERM GOAL #4  ? Title Pt. will report no tenderness with palpation over lumbar paraspinals to improve pain-free mobility .   ? Baseline Moderate lumbar parapinal tendenress/ minimal hypomobility.   ? Time 8   ? Period Weeks   ? Status New   ? Target Date 10/06/21   ? ?  ?  ? ?  ? ? ? Plan -   ? ? Clinical Impression Statement Pt. Did very well with supine TrA ex. Program with no issues noted during bridging/ position changes on mat table.  Minimal lumbar tenderness noted during STM to lumbar paraspinals.  PT reeducated pt. On use of TENS at home and compliance with HEP.    ? Personal Factors and Comorbidities Age;Sex;Finances;Comorbidity 3+;Profession   ? Examination-Activity Limitations Carry   ? Examination-Participation Restrictions Community Activity   ? Stability/Clinical Decision Making Evolving/Moderate complexity   ? Clinical Decision Making Moderate   ? Rehab Potential Good   ? PT Frequency 2x / week   ? PT Duration 8 weeks   ? PT Treatment/Interventions ADLs/Self Care Home Management;Aquatic Therapy;Cryotherapy;Moist Heat;Electrical Stimulation;Gait training;Stair training;Functional mobility training;Neuromuscular re-education;Balance training;Therapeutic exercise;Therapeutic activities;Patient/family education;Manual  techniques;Passive range of motion;Dry needling;Joint Manipulations;Spinal Manipulations   ? PT Next Visit Plan Discuss TENS unit and HEP progression.   ? Consulted and Agree with Plan of Care Patient   ? ?  ?  ? ?  ? ? ?Pura Spice, PT, DPT # [phone] ?08/26/2021, 8:29 PM ? ?   ?

## 2021-08-29 DIAGNOSIS — L700 Acne vulgaris: Secondary | ICD-10-CM | POA: Diagnosis not present

## 2021-08-29 DIAGNOSIS — Z79899 Other long term (current) drug therapy: Secondary | ICD-10-CM | POA: Diagnosis not present

## 2021-08-30 ENCOUNTER — Ambulatory Visit: Payer: Medicaid Other | Admitting: Internal Medicine

## 2021-08-30 ENCOUNTER — Encounter: Payer: Self-pay | Admitting: Internal Medicine

## 2021-08-30 VITALS — BP 128/78 | HR 79 | Ht 62.0 in | Wt 192.6 lb

## 2021-08-30 DIAGNOSIS — L709 Acne, unspecified: Secondary | ICD-10-CM

## 2021-08-30 DIAGNOSIS — J342 Deviated nasal septum: Secondary | ICD-10-CM

## 2021-08-30 DIAGNOSIS — E538 Deficiency of other specified B group vitamins: Secondary | ICD-10-CM

## 2021-08-30 DIAGNOSIS — Z6835 Body mass index (BMI) 35.0-35.9, adult: Secondary | ICD-10-CM

## 2021-08-30 DIAGNOSIS — L659 Nonscarring hair loss, unspecified: Secondary | ICD-10-CM

## 2021-08-30 MED ORDER — VITAMIN B-12 1000 MCG PO TABS: 1000.0000 ug | ORAL_TABLET | Freq: Every day | ORAL | 0 refills | Status: DC

## 2021-08-30 MED ORDER — PHENTERMINE HCL 37.5 MG PO CAPS: 37.5000 mg | ORAL_CAPSULE | ORAL | 1 refills | Status: DC

## 2021-08-30 NOTE — Progress Notes (Signed)
? ? ?Date:  08/30/2021  ? ?Name:  Christine Arellano   DOB:  06/28/1990   MRN:  [id number] ? ? ?Chief Complaint: Referral (To ENT for Deviated Septum. Referral to Dermatology for Continuity of Care.) ? ?Sinus Problem ?This is a recurrent problem. The problem has been waxing and waning since onset. There has been no fever. Associated symptoms include congestion and sinus pressure. Pertinent negatives include no coughing, headaches or shortness of breath. (Dentist did xrays and it shows a deviated septum ?)  ?Rash ?This is a recurrent (acne treated by Dermatology) problem. Associated symptoms include congestion. Pertinent negatives include no cough, diarrhea, fatigue or shortness of breath.  ?Obesity - she is still struggling with weight.  Her ortho and her Rheum both encourage weight reduction.  She has been successful in the past with phentermine and diet changes. ?B12 def - recent level was 189.  She has taken B12 injections in the past.  Currently not taking any supplements. ?Lab Results  ?Component Value Date  ? NA 136 06/29/2021  ? K 4.6 06/29/2021  ? CO2 22 06/29/2021  ? GLUCOSE 83 06/29/2021  ? BUN 13 06/29/2021  ? CREATININE 0.74 06/29/2021  ? CALCIUM 10.0 06/29/2021  ? EGFR 110 06/29/2021  ? GFRNONAA 134 05/31/2018  ? ?Lab Results  ?Component Value Date  ? CHOL 259 (H) 06/29/2021  ? HDL 87 06/29/2021  ? LDLCALC 141 (H) 06/29/2021  ? TRIG 179 (H) 06/29/2021  ? CHOLHDL 3.0 06/29/2021  ? ?Lab Results  ?Component Value Date  ? TSH 1.920 06/29/2021  ? ?No results found for: HGBA1C ?Lab Results  ?Component Value Date  ? WBC 7.8 06/29/2021  ? HGB 13.9 06/29/2021  ? HCT 42.1 06/29/2021  ? MCV 87 06/29/2021  ? PLT 344 06/29/2021  ? ?Lab Results  ?Component Value Date  ? ALT 12 06/29/2021  ? AST 18 06/29/2021  ? ALKPHOS 63 06/29/2021  ? BILITOT 0.4 06/29/2021  ? ?Lab Results  ?Component Value Date  ? VD25OH 33.2 11/06/2018  ?  ? ?Review of Systems  ?Constitutional:  Positive for unexpected weight change. Negative for  fatigue.  ?HENT:  Positive for congestion and sinus pressure. Negative for nosebleeds.   ?Eyes:  Negative for visual disturbance.  ?Respiratory:  Negative for cough, chest tightness, shortness of breath and wheezing.   ?Cardiovascular:  Negative for chest pain, palpitations and leg swelling.  ?Gastrointestinal:  Negative for abdominal pain, constipation and diarrhea.  ?Musculoskeletal:  Positive for arthralgias, back pain and myalgias.  ?Skin:  Positive for rash.  ?Neurological:  Negative for dizziness, weakness, light-headedness and headaches.  ?Psychiatric/Behavioral:  Positive for dysphoric mood and sleep disturbance. The patient is nervous/anxious.   ? ?Patient Active Problem List  ? Diagnosis Date Noted  ? Lumbosacral spondylosis with radiculopathy 08/03/2021  ? Lumbar back pain 03/30/2021  ? BMI 33.0-33.9,adult 03/30/2021  ? Migraine headache without aura 01/26/2021  ? Inverted nipple 05/05/2019  ? Moderate episode of recurrent major depressive disorder ([place]) 05/05/2019  ? 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  ? Generalized anxiety disorder 03/04/2019  ? Alopecia of scalp 03/04/2019  ? Dry eye syndrome of both eyes 10/18/2017  ? Adult acne 05/03/2015  ? ? ?No Known Allergies ? ?Past Surgical History:  ?Procedure Laterality Date  ? arthroscopic knee Right 2008  ? CESAREAN SECTION  2014  ? CESAREAN SECTION N/A 02/24/2015  ? Procedure: CESAREAN SECTION;  Surgeon: Rubie Maid, MD;  Location: [facility] ORS;  Service: Obstetrics;  Laterality: N/A;  ? CESAREAN  SECTION N/A 05/09/2017  ? Procedure: REPEAT CESAREAN SECTION;  Surgeon: Rubie Maid, MD;  Location: [facility] ORS;  Service: Obstetrics;  Laterality: N/A;  ? HEMORROIDECTOMY    ? THERAPEUTIC ABORTION  08/2018  ? fetal CNS abnormalities  ? TONSILLECTOMY    ? WISDOM TOOTH EXTRACTION    ? ? ?Social History  ? ?Tobacco Use  ? Smoking status: Former  ?  Packs/day: 1.00  ?  Types: Cigarettes  ?  Quit date: 08/27/2009  ?  Years since quitting: 12.0  ? Smokeless tobacco: Never  ?Vaping  Use  ? Vaping Use: Never used  ?Substance Use Topics  ? Alcohol use: Not Currently  ?  Alcohol/week: 0.0 standard drinks  ? Drug use: No  ? ? ? ?Medication list has been reviewed and updated. ? ?Current Meds  ?Medication Sig  ? ammonium lactate (LAC-HYDRIN) 12 % lotion Apply topically.  ? FLUoxetine (PROZAC) 40 MG capsule Take 40 mg by mouth daily.  ? fluticasone (FLONASE) 50 MCG/ACT nasal spray SPRAY 2 SPRAYS INTO EACH NOSTRIL EVERY DAY  ? gabapentin (NEURONTIN) 100 MG capsule Take 100-200 mg by mouth 3 (three) times daily.  ? HIBICLENS 4 % external liquid APPLY TO AFFECTED AREA TWICE A DAY  ? ibuprofen (ADVIL) 800 MG tablet TAKE 1 TABLET BY MOUTH EVERY 8 HOURS AS NEEDED  ? lamoTRIgine (LAMICTAL) 100 MG tablet Take 100 mg by mouth daily.  ? meloxicam (MOBIC) 15 MG tablet Take 1 tablet (15 mg total) by mouth daily.  ? minoxidil (LONITEN) 2.5 MG tablet Take 1.25 mg by mouth 2 (two) times daily.  ? norgestimate-ethinyl estradiol (ORTHO-CYCLEN) 0.25-35 MG-MCG tablet TAKE 1 TABLET BY MOUTH EVERY DAY  ? nystatin ointment (MYCOSTATIN) Apply 1 application topically 2 (two) times daily.  ? RETIN-A 0.05 % cream Apply 1 application topically at bedtime.  ? spironolactone (ALDACTONE) 100 MG tablet Take 100 mg by mouth daily.  ? tiZANidine (ZANAFLEX) 4 MG tablet Take 1 tablet (4 mg total) by mouth at bedtime.  ? traZODone (DESYREL) 100 MG tablet Take 100 mg by mouth at bedtime as needed.  ? VYVANSE 40 MG capsule Take 40 mg by mouth every morning.  ? ? ? ?  08/30/2021  ?  8:09 AM 08/03/2021  ?  8:48 AM 06/29/2021  ?  8:57 AM 05/16/2021  ?  3:39 PM  ?GAD 7 : Generalized Anxiety Score  ?Nervous, Anxious, on Edge _0 ?Control/stop worrying _1 ?Worry too much - different things _2 ?Trouble relaxing 0 2 1 0  ?Restless 1 1 0 0  ?Easily annoyed or irritable _3 ?Afraid - awful might happen 0 0 0 1  ?Total GAD 7 Score _4 ?Anxiety Difficulty Somewhat difficult Extremely difficult  Not difficult at all  ? ? ? ?   08/30/2021  ?  8:09 AM  ?Depression screen PHQ 2/9  ?Decreased Interest 3  ?Down, Depressed, Hopeless 1  ?PHQ - 2 Score 4  ?Altered sleeping 3  ?Tired, decreased energy 3  ?Change in appetite 2  ?Feeling bad or failure about yourself  0  ?Trouble concentrating 0  ?Moving slowly or fidgety/restless 0  ?Suicidal thoughts 3  ?PHQ-9 Score 15  ?Difficult doing work/chores Very difficult  ? ? ?BP Readings from Last 3 Encounters:  ?08/30/21 128/78  ?08/03/21 102/72  ?06/29/21 104/74  ? ? ?Physical Exam ?Vitals and nursing note reviewed.  ?Constitutional:   ?  General: She is not in acute distress. ?   Appearance: Normal appearance. She is well-developed.  ?HENT:  ?   Head: Normocephalic and atraumatic.  ?Cardiovascular:  ?   Rate and Rhythm: Normal rate and regular rhythm.  ?Pulmonary:  ?   Effort: Pulmonary effort is normal. No respiratory distress.  ?   Breath sounds: No wheezing or rhonchi.  ?Musculoskeletal:  ?   Cervical back: Normal range of motion.  ?   Right lower leg: No edema.  ?   Left lower leg: No edema.  ?Skin: ?   General: Skin is warm and dry.  ?   Findings: No rash.  ?Neurological:  ?   General: No focal deficit present.  ?   Mental Status: She is alert and oriented to person, place, and time.  ?Psychiatric:     ?   Mood and Affect: Mood normal.     ?   Behavior: Behavior normal.  ? ? ?Wt Readings from Last 3 Encounters:  ?08/30/21 192 lb 9.6 oz (87.4 kg)  ?08/03/21 192 lb (87.1 kg)  ?06/29/21 181 lb (82.1 kg)  ? ? ?BP 128/78   Pulse 79   Ht 5' 2" (1.575 m)   Wt 192 lb 9.6 oz (87.4 kg)   SpO2 98%   BMI 35.23 kg/m?  ? ?Assessment and Plan: ?1. B12 nutritional deficiency ?Begin oral supplement - recheck levels next visit ?- vitamin B-12 (CYANOCOBALAMIN) 1000 MCG tablet; Take 1 tablet (1,000 mcg total) by mouth daily.  Dispense: 90 tablet; Refill: 0 ? ?2. BMI 35.0-35.9,adult ?Begin exercise and diet changes ?Recheck in 6 weeks. ?- phentermine 37.5 MG capsule; Take 1 capsule (37.5 mg total) by mouth every  morning.  Dispense: 30 capsule; Refill: 1 ? ?3. Deviated septum ?- Ambulatory referral to ENT ? ?4. Adult acne ?- Ambulatory referral to Dermatology ? ?5. Alopecia of scalp ?- Ambulatory referral to Dermatology ? ?

## 2021-08-31 ENCOUNTER — Other Ambulatory Visit: Payer: Self-pay | Admitting: Internal Medicine

## 2021-08-31 ENCOUNTER — Encounter: Payer: Self-pay | Admitting: Physical Therapy

## 2021-08-31 ENCOUNTER — Ambulatory Visit: Payer: Medicaid Other | Attending: Family Medicine | Admitting: Physical Therapy

## 2021-08-31 DIAGNOSIS — M2560 Stiffness of unspecified joint, not elsewhere classified: Secondary | ICD-10-CM | POA: Diagnosis not present

## 2021-08-31 DIAGNOSIS — M6281 Muscle weakness (generalized): Secondary | ICD-10-CM | POA: Diagnosis not present

## 2021-08-31 DIAGNOSIS — M4727 Other spondylosis with radiculopathy, lumbosacral region: Secondary | ICD-10-CM | POA: Diagnosis not present

## 2021-08-31 DIAGNOSIS — M545 Low back pain, unspecified: Secondary | ICD-10-CM | POA: Diagnosis not present

## 2021-08-31 NOTE — Therapy (Signed)
?OUTPATIENT PHYSICAL THERAPY TREATMENT NOTE ? ? ?Patient Name: Christine Arellano ?MRN: [id number] ?DOB:Dec 09, 1990, 31 y.o., female ?Today's Date: 08/31/2021 ? ?PCP: Glean Hess, MD ?REFERRING PROVIDER: Montel Culver, MD ? ? PT End of Session - 08/31/21 1041   ? ? Visit Number 4   ? Number of Visits 16   ? Date for PT Re-Evaluation 10/06/21   ? Authorization - Visit Number 4   ? Authorization - Number of Visits 10   ? PT Start Time 1041   ? PT Stop Time 1135   ? PT Time Calculation (min) 54 min   ? Activity Tolerance Patient tolerated treatment well   ? Behavior During Therapy [facility] for tasks assessed/performed   ? ?  ?  ? ?  ? ? ?Past Medical History:  ?Diagnosis Date  ? Acute midline thoracic back pain 05/05/2019  ? Anxiety   ? Depression   ? GERD (gastroesophageal reflux disease)   ? Headache   ? History of IBS   ? Insomnia   ? Knee pain 07/06/2016  ? Mood disorder ([place]) 03/21/2016  ? normal karyotype after Abnormal chromosomal and genetic finding on antenatal screening mother   ? S/p amnio 06/24/2018- 46XX  SMA and CF pending   ? S/P cesarean section 02/24/2015  ? ?Past Surgical History:  ?Procedure Laterality Date  ? arthroscopic knee Right 2008  ? CESAREAN SECTION  2014  ? CESAREAN SECTION N/A 02/24/2015  ? Procedure: CESAREAN SECTION;  Surgeon: Rubie Maid, MD;  Location: [facility] ORS;  Service: Obstetrics;  Laterality: N/A;  ? CESAREAN SECTION N/A 05/09/2017  ? Procedure: REPEAT CESAREAN SECTION;  Surgeon: Rubie Maid, MD;  Location: [facility] ORS;  Service: Obstetrics;  Laterality: N/A;  ? HEMORROIDECTOMY    ? THERAPEUTIC ABORTION  08/2018  ? fetal CNS abnormalities  ? TONSILLECTOMY    ? WISDOM TOOTH EXTRACTION    ? ?Patient Active Problem List  ? Diagnosis Date Noted  ? B12 nutritional deficiency 08/30/2021  ? Lumbosacral spondylosis with radiculopathy 08/03/2021  ? Lumbar back pain 03/30/2021  ? BMI 33.0-33.9,adult 03/30/2021  ? Migraine headache without aura 01/26/2021  ? Inverted nipple 05/05/2019  ? Moderate  episode of recurrent major depressive disorder ([place]) 05/05/2019  ? 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  ? Generalized anxiety disorder 03/04/2019  ? Alopecia of scalp 03/04/2019  ? Dry eye syndrome of both eyes 10/18/2017  ? Adult acne 05/03/2015  ? ? ?REFERRING DIAG: Lumbosacral spondylosis with radiculopathy ? ?THERAPY DIAG:  ?Lumbosacral spondylosis with radiculopathy ? ?Lumbar back pain ? ?Joint stiffness ? ?Muscle weakness (generalized) ? ?PERTINENT HISTORY: See evaluation ? ?PRECAUTIONS: N/A ? ?SUBJECTIVE: Pt. Reports no new complaints.  Pt. Is starting a new medication for appetite suppressant.  No c/o low back pain this morning.  Pt. Did have discomfort in low back while watching kids soccer this past weekend.  ? ?PAIN:  ?Are you having pain? Yes: NPRS scale: 5/10 ?Pain location: bilateral knees ?Pain description: sharp/ achy ?Aggravating factors: walking/ prolonged sitting/ stairs ?Relieving factors: rest ? ? ? ? ?TODAY'S TREATMENT:  ?Ther.ex.: ?  ?Nustep L4 10 min. B UE/LE (discussed weekend activities).   ? ?Nautilus: seated on blue ball 40# lat. Pull downs/ 40# tricep extension/ 4# shoulder flexion/ chest press 20x each.  Alt. UE/LE TrA ex. On blue ball 20x.   ? ?Walking in PT clinic with good posture/ normalized gait pattern.   ? ?Pt. Will continue with current TrA ex. Program in supine position.   ? ? ?  Manual tx.: ?  ?Supine LE/lumbar generalized stretching.  Pt. Feels low back tightness at end-range trunk rotn.    ? ?Prone STM to low thoracic/lumbar paraspinals (use of Hypervolt)- tolerates well ?  ?PT reviewed Trial TENS unit and made recommendation for personal unit (issued handout with TENS unit on [place].   ? ? ?PATIENT EDUCATION: ?Education details: HEP ?Person educated: Patient ?Education method: Explanation, Demonstration, and Handouts ?Education comprehension: verbalized understanding and returned demonstration ? ? ?HOME EXERCISE PROGRAM: ?Issued pages #1-3 of TrA ex. program. Issued trial TENS unit  for pain mgmt. ? ? ? ? PT Long Term Goals -   ? ?  ? PT LONG TERM GOAL #1  ? Title Pt. will increase FOTO to 57 to improve pain-free mobility.   ? Baseline Initial FOTO: 50   ? Time 8   ? Period Weeks   ? Status New   ? Target Date 10/06/21   ?  ? PT LONG TERM GOAL #2  ? Title Pt. independent with HEP to increase TrA muscle activation/ lumbar extension to decrease LE radicular symptoms.   ? Baseline B LE radicular symtoms.  Pain limited lumbar extension.  Pt. currently not participating with HEP.   ? Time 8   ? Period Weeks   ? Status New   ? Target Date 10/06/21   ?  ? PT LONG TERM GOAL #3  ? Title Pt. able to complete a 45 minute ther.ex. program with no increase c/o low back or radicular symptoms to improve functional activity.   ? Baseline pt. currently not participating with HEP   ? Time 8   ? Period Weeks   ? Status New   ? Target Date 10/06/21   ?  ? PT LONG TERM GOAL #4  ? Title Pt. will report no tenderness with palpation over lumbar paraspinals to improve pain-free mobility .   ? Baseline Moderate lumbar parapinal tendenress/ minimal hypomobility.   ? Time 8   ? Period Weeks   ? Status New   ? Target Date 10/06/21   ? ?  ?  ? ?  ? ? ? Plan -   ? ? Clinical Impression Statement No increase c/o low back pain t/o tx. Session and pt. Presents with good upright posture on blue ball.  Good hamstring/ lumbar mobility in supine position.  B SI/ lumbar tenderness during STM and Hypervolt.  No change to HEP at this time and pt. Will purchase her own personal TENS unit.  Pt. Will continue to benefit from skilled PT services to increase core/lumbar stability to improve pain-free mobility.    ? Personal Factors and Comorbidities Age;Sex;Finances;Comorbidity 3+;Profession   ? Examination-Activity Limitations Carry   ? Examination-Participation Restrictions Community Activity   ? Stability/Clinical Decision Making Evolving/Moderate complexity   ? Clinical Decision Making Moderate   ? Rehab Potential Good   ? PT Frequency  2x / week   ? PT Duration 8 weeks   ? PT Treatment/Interventions ADLs/Self Care Home Management;Aquatic Therapy;Cryotherapy;Moist Heat;Electrical Stimulation;Gait training;Stair training;Functional mobility training;Neuromuscular re-education;Balance training;Therapeutic exercise;Therapeutic activities;Patient/family education;Manual techniques;Passive range of motion;Dry needling;Joint Manipulations;Spinal Manipulations   ? PT Next Visit Plan Discuss TENS unit and HEP progression.   ? Consulted and Agree with Plan of Care Patient   ? ?  ?  ? ?  ? ? ?Pura Spice, PT, DPT # [phone] ?08/31/2021, 11:59 AM ? ?   ?

## 2021-09-01 DIAGNOSIS — F339 Major depressive disorder, recurrent, unspecified: Secondary | ICD-10-CM | POA: Diagnosis not present

## 2021-09-01 DIAGNOSIS — F429 Obsessive-compulsive disorder, unspecified: Secondary | ICD-10-CM | POA: Diagnosis not present

## 2021-09-01 NOTE — Telephone Encounter (Signed)
Requested medication (s) are due for refill today: yes ? ?Requested medication (s) are on the active medication list: yes ? ?Last refill:  08/03/21 #30 ? ?Future visit scheduled: 09/14/21 ? ?Notes to clinic:  medication not delegated to NT to RF ? ? ?Requested Prescriptions  ?Pending Prescriptions Disp Refills  ? tiZANidine (ZANAFLEX) 4 MG tablet [Pharmacy Med Name: TIZANIDINE HCL 4 MG TABLET] 30 tablet 0  ?  Sig: TAKE 1 TABLET BY MOUTH AT BEDTIME.  ?  ? Not Delegated - Cardiovascular:  Alpha-2 Agonists - tizanidine Failed - 08/31/2021  1:36 PM  ?  ?  Failed - This refill cannot be delegated  ?  ?  Passed - Valid encounter within last 6 months  ?  Recent Outpatient Visits   ? ?      ? 2 days ago B12 nutritional deficiency  ? [facility] Glean Hess, MD  ? 4 weeks ago Lumbosacral spondylosis with radiculopathy  ? [facility] Montel Culver, MD  ? 2 months ago Annual physical exam  ? [facility] Glean Hess, MD  ? 3 months ago Arthralgia of both knees  ? [facility] Glean Hess, MD  ? 5 months ago BMI 33.0-33.9,adult  ? [facility] Glean Hess, MD  ? ?  ?  ?Future Appointments   ? ?        ? In 1 week Zigmund Daniel Earley Abide, MD [facility], [place]  ? In 1 month Army Melia Jesse Sans, MD [facility], [place]  ? In 10 months Glean Hess, MD [facility], [place]  ? ?  ? ?  ?  ?  ? ? ? ? ?

## 2021-09-02 ENCOUNTER — Ambulatory Visit: Payer: Medicaid Other | Admitting: Physical Therapy

## 2021-09-02 ENCOUNTER — Encounter: Payer: Self-pay | Admitting: Physical Therapy

## 2021-09-02 DIAGNOSIS — M2560 Stiffness of unspecified joint, not elsewhere classified: Secondary | ICD-10-CM | POA: Diagnosis not present

## 2021-09-02 DIAGNOSIS — M545 Low back pain, unspecified: Secondary | ICD-10-CM | POA: Diagnosis not present

## 2021-09-02 DIAGNOSIS — M4727 Other spondylosis with radiculopathy, lumbosacral region: Secondary | ICD-10-CM | POA: Diagnosis not present

## 2021-09-02 DIAGNOSIS — M6281 Muscle weakness (generalized): Secondary | ICD-10-CM

## 2021-09-02 NOTE — Therapy (Signed)
?OUTPATIENT PHYSICAL THERAPY TREATMENT NOTE ? ? ?Patient Name: Christine Arellano ?MRN: [id number] ?DOB:08-18-1990, 31 y.o., female ?Today's Date: 09/02/2021 ? ?PCP: Glean Hess, MD ?REFERRING PROVIDER: Montel Culver, MD ? ? PT End of Session - 09/02/21 1132   ? ? Visit Number 5   ? Number of Visits 16   ? Date for PT Re-Evaluation 10/06/21   ? Authorization - Visit Number 5   ? Authorization - Number of Visits 10   ? PT Start Time [phone]   ? PT Stop Time 1038   ? PT Time Calculation (min) 47 min   ? Activity Tolerance Patient tolerated treatment well   ? Behavior During Therapy [facility] for tasks assessed/performed   ? ?  ?  ? ?  ? ? ?Past Medical History:  ?Diagnosis Date  ? Acute midline thoracic back pain 05/05/2019  ? Anxiety   ? Depression   ? GERD (gastroesophageal reflux disease)   ? Headache   ? History of IBS   ? Insomnia   ? Knee pain 07/06/2016  ? Mood disorder ([place]) 03/21/2016  ? normal karyotype after Abnormal chromosomal and genetic finding on antenatal screening mother   ? S/p amnio 06/24/2018- 46XX  SMA and CF pending   ? S/P cesarean section 02/24/2015  ? ?Past Surgical History:  ?Procedure Laterality Date  ? arthroscopic knee Right 2008  ? CESAREAN SECTION  2014  ? CESAREAN SECTION N/A 02/24/2015  ? Procedure: CESAREAN SECTION;  Surgeon: Rubie Maid, MD;  Location: [facility] ORS;  Service: Obstetrics;  Laterality: N/A;  ? CESAREAN SECTION N/A 05/09/2017  ? Procedure: REPEAT CESAREAN SECTION;  Surgeon: Rubie Maid, MD;  Location: [facility] ORS;  Service: Obstetrics;  Laterality: N/A;  ? HEMORROIDECTOMY    ? THERAPEUTIC ABORTION  08/2018  ? fetal CNS abnormalities  ? TONSILLECTOMY    ? WISDOM TOOTH EXTRACTION    ? ?Patient Active Problem List  ? Diagnosis Date Noted  ? B12 nutritional deficiency 08/30/2021  ? Lumbosacral spondylosis with radiculopathy 08/03/2021  ? Lumbar back pain 03/30/2021  ? BMI 33.0-33.9,adult 03/30/2021  ? Migraine headache without aura 01/26/2021  ? Inverted nipple 05/05/2019  ? Moderate  episode of recurrent major depressive disorder ([place]) 05/05/2019  ? 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  ? Generalized anxiety disorder 03/04/2019  ? Alopecia of scalp 03/04/2019  ? Dry eye syndrome of both eyes 10/18/2017  ? Adult acne 05/03/2015  ? ? ?REFERRING DIAG: Lumbosacral spondylosis with radiculopathy ? ?THERAPY DIAG:  ?Lumbosacral spondylosis with radiculopathy ? ?Lumbar back pain ? ?Joint stiffness ? ?Muscle weakness (generalized) ? ?PERTINENT HISTORY: See evaluation ? ?PRECAUTIONS: N/A ? ?SUBJECTIVE: Pt. Reports minimal low back pain.  Pt. C/o R UE/ hand aching and tingling with certain movement pattern.  No tingling in hand while riding Nustep.   ? ?PAIN:  ?Are you having pain? Yes: NPRS scale: 1/10 ?Pain location: low back (central) ?Pain description: sharp/ achy ?Aggravating factors: walking/ prolonged sitting/ stairs ?Relieving factors: rest ? ? ? ? ?TODAY'S TREATMENT:  ?Ther.ex.: ?  ?Nustep L4 10 min. B UE/LE (discussed weekend activities).   ? ?Walking in PT clinic with good posture/ normalized gait pattern ? ?Seated blue ball ex. At //-bars/ mirror: pelvic tilts/ circles/ marching/ LAQ/ alt. UE and LE/ 3# chest press/ 3# shoulder flexion 10x2.   Thoracic/lumbar extension on blue ball with //-bar assist 3x.   ? ?Supine pelvic tilts/ reviewed HEP ? ? ?Manual tx.: ?  ?Seated thoracic rotn. L/R (multiple cavitations noted).  ? ?  Supine LE/lumbar generalized stretching 3x each.  Neural glides L/R foot.    ? ?Prone STM to low thoracic/lumbar paraspinals (use of Hypervolt)- tolerates well ?  ? ? ?PATIENT EDUCATION: ?Education details: HEP ?Person educated: Patient ?Education method: Explanation, Demonstration, and Handouts ?Education comprehension: verbalized understanding and returned demonstration ? ? ?HOME EXERCISE PROGRAM: ?Issued pages #1-3 of TrA ex. program. Issued trial TENS unit for pain mgmt. ? ? ? ? PT Long Term Goals -   ? ?  ? PT LONG TERM GOAL #1  ? Title Pt. will increase FOTO to 57 to improve  pain-free mobility.   ? Baseline Initial FOTO: 50   ? Time 8   ? Period Weeks   ? Status New   ? Target Date 10/06/21   ?  ? PT LONG TERM GOAL #2  ? Title Pt. independent with HEP to increase TrA muscle activation/ lumbar extension to decrease LE radicular symptoms.   ? Baseline B LE radicular symtoms.  Pain limited lumbar extension.  Pt. currently not participating with HEP.   ? Time 8   ? Period Weeks   ? Status New   ? Target Date 10/06/21   ?  ? PT LONG TERM GOAL #3  ? Title Pt. able to complete a 45 minute ther.ex. program with no increase c/o low back or radicular symptoms to improve functional activity.   ? Baseline pt. currently not participating with HEP   ? Time 8   ? Period Weeks   ? Status New   ? Target Date 10/06/21   ?  ? PT LONG TERM GOAL #4  ? Title Pt. will report no tenderness with palpation over lumbar paraspinals to improve pain-free mobility .   ? Baseline Moderate lumbar parapinal tendenress/ minimal hypomobility.   ? Time 8   ? Period Weeks   ? Status New   ? Target Date 10/06/21   ? ?  ?  ? ?  ? ? ? Plan -   ? ? Clinical Impression Statement No increase c/o low back pain t/o tx. Session and pt. Presents with good upright posture on blue ball.  Good hamstring/ lumbar mobility in supine position.  Multiple thoracic/lumbar spine cavitations during seated rotn.  B SI/ lumbar tenderness during STM and Hypervolt.   Good tx. Endurance with therex./ Nustep with no increase pain.  Pt. Will continue to benefit from skilled PT services to increase core/lumbar stability to improve pain-free mobility.    ? Personal Factors and Comorbidities Age;Sex;Finances;Comorbidity 3+;Profession   ? Examination-Activity Limitations Carry   ? Examination-Participation Restrictions Community Activity   ? Stability/Clinical Decision Making Evolving/Moderate complexity   ? Clinical Decision Making Moderate   ? Rehab Potential Good   ? PT Frequency 2x / week   ? PT Duration 8 weeks   ? PT Treatment/Interventions ADLs/Self  Care Home Management;Aquatic Therapy;Cryotherapy;Moist Heat;Electrical Stimulation;Gait training;Stair training;Functional mobility training;Neuromuscular re-education;Balance training;Therapeutic exercise;Therapeutic activities;Patient/family education;Manual techniques;Passive range of motion;Dry needling;Joint Manipulations;Spinal Manipulations   ? PT Next Visit Plan Discuss TENS unit and HEP progression.   ? Consulted and Agree with Plan of Care Patient   ? ?  ?  ? ?  ? ? ?Pura Spice, PT, DPT # [phone] ?09/02/2021, 11:34 AM ? ?   ?

## 2021-09-07 ENCOUNTER — Ambulatory Visit: Payer: Medicaid Other | Admitting: Physical Therapy

## 2021-09-09 ENCOUNTER — Ambulatory Visit: Payer: Medicaid Other | Admitting: Physical Therapy

## 2021-09-12 DIAGNOSIS — L700 Acne vulgaris: Secondary | ICD-10-CM | POA: Diagnosis not present

## 2021-09-12 DIAGNOSIS — Z79899 Other long term (current) drug therapy: Secondary | ICD-10-CM | POA: Diagnosis not present

## 2021-09-14 ENCOUNTER — Inpatient Hospital Stay: Payer: Self-pay | Admitting: Radiology

## 2021-09-14 ENCOUNTER — Encounter: Payer: Self-pay | Admitting: Family Medicine

## 2021-09-14 ENCOUNTER — Ambulatory Visit: Payer: Medicaid Other | Admitting: Family Medicine

## 2021-09-14 ENCOUNTER — Ambulatory Visit: Payer: Medicaid Other | Admitting: Internal Medicine

## 2021-09-14 VITALS — BP 118/78 | HR 78 | Ht 62.0 in | Wt 184.8 lb

## 2021-09-14 DIAGNOSIS — M4727 Other spondylosis with radiculopathy, lumbosacral region: Secondary | ICD-10-CM

## 2021-09-14 DIAGNOSIS — G8929 Other chronic pain: Secondary | ICD-10-CM | POA: Diagnosis not present

## 2021-09-14 DIAGNOSIS — M533 Sacrococcygeal disorders, not elsewhere classified: Secondary | ICD-10-CM

## 2021-09-14 MED ORDER — CELECOXIB 200 MG PO CAPS: ORAL_CAPSULE | ORAL | 2 refills | Status: DC

## 2021-09-14 MED ORDER — TRIAMCINOLONE ACETONIDE 40 MG/ML IJ SUSP
40.0000 mg | Freq: Once | INTRAMUSCULAR | Status: AC
  Administered 2021-09-14: 40 mg via INTRAMUSCULAR

## 2021-09-14 NOTE — Patient Instructions (Addendum)
You have just been given a cortisone injection to reduce pain and inflammation. After the injection you may notice immediate relief of pain as a result of the Lidocaine. It is important to rest the area of the injection for 24 to 48 hours after the injection. There is a possibility of some temporary increased discomfort and swelling for up to 72 hours until the cortisone begins to work. If you do have pain, simply rest the joint and use ice. If you can tolerate over the counter medications, you can try Tylenol for added relief per package instructions. ?- Stop meloxicam ?- Start Celebrex, can dose every 12 hours on as-needed basis for low back/hip pain ?- Continue with physical therapy and home exercises ?- Referral coordinator will contact you in regards to pain/spine group follow-up ?- Contact us for any questions and follow-up as needed ?

## 2021-09-14 NOTE — Assessment & Plan Note (Signed)
Chronic condition in the setting of mild lumbosacral spondylosis and postural/gait mechanic issues. See additional assessment(s) for plan details. ?

## 2021-09-14 NOTE — Assessment & Plan Note (Signed)
Patient presents for follow-up to chronic low back pain with radiation into bilateral buttocks primarily with weightbearing, prolonged sitting, prolonged standing.  At her last visit she was advised scheduled meloxicam, as needed tizanidine, physical therapy, and close follow-up.  She has noted that while PT sessions help, pain does rapidly recur and did not note significant response from the meloxicam. ? ?Examination today shows tenderness at the right greater than left SI joints, symmetrically at the greater trochanteric regions bilaterally, and painful lumbar extension that localizes central, negative straight leg raise. ? ?Given her persistent symptomatology, I offered additional treatment strategies and she did elect to proceed with ultrasound-guided sacroiliac joint injections bilaterally.  Given her prior x-ray findings and examination findings today, there is clear lumbosacral involvement, primarily at what appears to be the facets, and I have placed a referral to pain and spine for additional interventional options.  Over the interim she is to continue with formal physical therapy to work on postural stabilization and gait mechanics, as needed Celebrex was prescribed, and she can follow-up on an as needed next steps. ? ?Chronic condition, symptomatic, Rx management ?

## 2021-09-14 NOTE — Progress Notes (Signed)
?  ? ?Primary Care / Sports Medicine Office Visit ? ?Patient Information:  ?Patient ID: Christine Arellano, female DOB: 1990/10/08 Age: 31 y.o. MRN: [id number]  ? ?Christine Arellano is a pleasant 31 y.o. female presenting with the following: ? ?Chief Complaint  ?Patient presents with  ? Lumbosacral spondylosis with radiculopathy  ?  No changes. Medications aren't helping. Pt states that right hand and arm are starting to go tingling. No MRI, last Xray 2022  ? ? ?Vitals:  ? 09/14/21 0921  ?BP: 118/78  ?Pulse: 78  ?SpO2: 97%  ? ?Vitals:  ? 09/14/21 0921  ?Weight: 184 lb 12.8 oz (83.8 kg)  ?Height: '5\' 2"'$  (1.575 m)  ? ?Body mass index is 33.8 kg/m?. ? ?No results found.  ? ?Independent interpretation of notes and tests performed by another provider:  ? ?None ? ?Procedures performed:  ? ?Procedure:  Injection of right sacroiliac joint under ultrasound guidance. ?Ultrasound guidance utilized for in-plane approach to the right sacroiliac joint from a medial-lateral trajectory, no cortical irregularities noted sonographically ?Samsung HS60 device utilized with permanent recording / reporting. ?Verbal informed consent obtained and verified. ?Skin prepped in a sterile fashion. ?Ethyl chloride for topical local analgesia.  ?Completed without difficulty and tolerated well. ?Medication: triamcinolone acetonide 40 mg/mL suspension for injection 1 mL total and 2 mL lidocaine 1% without epinephrine utilized for needle placement anesthetic ?Advised to contact for fevers/chills, erythema, induration, drainage, or persistent bleeding. ? ?Procedure:  Injection of left sacroiliac joint under ultrasound guidance. ?Ultrasound guidance utilized for in-plane approach to the left sacroiliac joint with a medial-lateral trajectory utilized, no sonographic abnormalities noted of the visualized joint space ?Samsung HS60 device utilized with permanent recording / reporting. ?Verbal informed consent obtained and verified. ?Skin prepped in a sterile  fashion. ?Ethyl chloride for topical local analgesia.  ?Completed without difficulty and tolerated well. ?Medication: triamcinolone acetonide 40 mg/mL suspension for injection 1 mL total and 2 mL lidocaine 1% without epinephrine utilized for needle placement anesthetic ?Advised to contact for fevers/chills, erythema, induration, drainage, or persistent bleeding. ? ? ?Pertinent History, Exam, Impression, and Recommendations:  ? ?Problem List Items Addressed This Visit   ? ?  ? Nervous and Auditory  ? Lumbosacral spondylosis with radiculopathy  ?  Patient presents for follow-up to chronic low back pain with radiation into bilateral buttocks primarily with weightbearing, prolonged sitting, prolonged standing.  At her last visit she was advised scheduled meloxicam, as needed tizanidine, physical therapy, and close follow-up.  She has noted that while PT sessions help, pain does rapidly recur and did not note significant response from the meloxicam. ? ?Examination today shows tenderness at the right greater than left SI joints, symmetrically at the greater trochanteric regions bilaterally, and painful lumbar extension that localizes central, negative straight leg raise. ? ?Given her persistent symptomatology, I offered additional treatment strategies and she did elect to proceed with ultrasound-guided sacroiliac joint injections bilaterally.  Given her prior x-ray findings and examination findings today, there is clear lumbosacral involvement, primarily at what appears to be the facets, and I have placed a referral to pain and spine for additional interventional options.  Over the interim she is to continue with formal physical therapy to work on postural stabilization and gait mechanics, as needed Celebrex was prescribed, and she can follow-up on an as needed next steps. ? ?Chronic condition, symptomatic, Rx management ? ?  ?  ? Relevant Medications  ? hydrOXYzine (VISTARIL) 25 MG capsule  ? FLUoxetine HCl 60 MG  TABS  ?  celecoxib (CELEBREX) 200 MG capsule  ? Other Relevant Orders  ? Ambulatory referral to Pain Clinic  ?  ? Other  ? Chronic sacroiliac joint pain - Primary  ?  Chronic condition in the setting of mild lumbosacral spondylosis and postural/gait mechanic issues. See additional assessment(s) for plan details. ? ?  ?  ? Relevant Medications  ? FLUoxetine HCl 60 MG TABS  ? celecoxib (CELEBREX) 200 MG capsule  ? Other Relevant Orders  ? Ambulatory referral to Pain Clinic  ? [facility]  ?  ? ?Orders & Medications ?Meds ordered this encounter  ?Medications  ? celecoxib (CELEBREX) 200 MG capsule  ?  Sig: One to 2 tablets by mouth daily as needed for pain.  ?  Dispense:  60 capsule  ?  Refill:  2  ? triamcinolone acetonide (KENALOG-40) injection 40 mg  ? ?Orders Placed This Encounter  ?Procedures  ? [facility]  ? Ambulatory referral to Pain Clinic  ?  ? ?Return if symptoms worsen or fail to improve.  ?  ? ?Montel Culver, MD ? ? Primary Care Sports Medicine ?[facility] ?[place]  ? ?

## 2021-09-15 ENCOUNTER — Ambulatory Visit: Payer: Medicaid Other | Admitting: Physical Therapy

## 2021-09-15 DIAGNOSIS — S83521A Sprain of posterior cruciate ligament of right knee, initial encounter: Secondary | ICD-10-CM | POA: Diagnosis not present

## 2021-09-20 ENCOUNTER — Telehealth: Payer: Self-pay | Admitting: Internal Medicine

## 2021-09-20 NOTE — Telephone Encounter (Signed)
Copied from [place] [id number]. Topic: General - Other ?>> Sep 20, 2021  8:06 AM Loma [place] wrote: ?Reason for CRM: Pt got injection on 4/19 and it has not helped all , she is in a lot of pain and seems to be getting worse, she wants a fu call [phone]. She has not heard anything re the referral and is very desperate. States she needs to make sure her medical notes are correct as needing to get disability but needs support now also,. FU to advise ?

## 2021-09-20 NOTE — Telephone Encounter (Signed)
Please review.  KP

## 2021-09-21 ENCOUNTER — Telehealth: Payer: Self-pay | Admitting: Internal Medicine

## 2021-09-21 ENCOUNTER — Other Ambulatory Visit: Payer: Self-pay | Admitting: Internal Medicine

## 2021-09-21 NOTE — Telephone Encounter (Signed)
Copied from [place] [id number]. Topic: General - Other ?>> Sep 21, 2021 12:40 PM Leward Quan A wrote: ?Reason for CRM: Patient called in and stated that she need to have some medical diagnoses updated and corrected in her medical records and not sure who she need to speak to in the office but would like a call back ASAP please to discuss and have these corrections made can be reached at Ph# [phone] ?

## 2021-09-21 NOTE — Telephone Encounter (Signed)
I have sent referral coordinator a message asking to check on [place] ?

## 2021-09-22 DIAGNOSIS — M2550 Pain in unspecified joint: Secondary | ICD-10-CM | POA: Insufficient documentation

## 2021-09-22 DIAGNOSIS — M797 Fibromyalgia: Secondary | ICD-10-CM | POA: Insufficient documentation

## 2021-09-22 NOTE — Telephone Encounter (Signed)
Called and spoke with patient. She said that she was reviewing her my chart and noticed that we did not have that she has Aura's with her Migraines. I fixed this diagnosis in her chart. Also, she said we did not update her problem list stating that she now has fibromyalgia. ? ?Added this diagnosis to the problem list. Patient stated she is filing for disability and wants to be sure her medical records are correct. ?

## 2021-09-26 ENCOUNTER — Ambulatory Visit: Payer: Medicaid Other | Admitting: Physical Therapy

## 2021-09-29 ENCOUNTER — Ambulatory Visit: Payer: Medicaid Other | Attending: Family Medicine | Admitting: Physical Therapy

## 2021-09-29 DIAGNOSIS — L700 Acne vulgaris: Secondary | ICD-10-CM | POA: Diagnosis not present

## 2021-09-29 DIAGNOSIS — M545 Low back pain, unspecified: Secondary | ICD-10-CM | POA: Insufficient documentation

## 2021-09-29 DIAGNOSIS — M4727 Other spondylosis with radiculopathy, lumbosacral region: Secondary | ICD-10-CM | POA: Insufficient documentation

## 2021-09-29 DIAGNOSIS — M2560 Stiffness of unspecified joint, not elsewhere classified: Secondary | ICD-10-CM | POA: Insufficient documentation

## 2021-09-29 DIAGNOSIS — M6281 Muscle weakness (generalized): Secondary | ICD-10-CM | POA: Insufficient documentation

## 2021-10-04 ENCOUNTER — Other Ambulatory Visit: Payer: Self-pay | Admitting: Internal Medicine

## 2021-10-05 NOTE — Telephone Encounter (Signed)
Requested medication (s) are due for refill today:   Provider to review ? ?Requested medication (s) are on the active medication list:   Yes ? ?Future visit scheduled:   Yes ? ? ?Last ordered: 09/01/2021 #30, 0 refills ? ?Non delegated refill reason returned  ? ?Requested Prescriptions  ?Pending Prescriptions Disp Refills  ? tiZANidine (ZANAFLEX) 4 MG tablet [Pharmacy Med Name: TIZANIDINE HCL 4 MG TABLET] 30 tablet 0  ?  Sig: TAKE 1 TABLET BY MOUTH EVERYDAY AT BEDTIME  ?  ? Not Delegated - Cardiovascular:  Alpha-2 Agonists - tizanidine Failed - 10/04/2021  1:35 PM  ?  ?  Failed - This refill cannot be delegated  ?  ?  Passed - Valid encounter within last 6 months  ?  Recent Outpatient Visits   ? ?      ? 3 weeks ago Chronic sacroiliac joint pain  ? [place], MD  ? 1 month ago B12 nutritional deficiency  ? [facility] Glean Hess, MD  ? 2 months ago Lumbosacral spondylosis with radiculopathy  ? [facility] Montel Culver, MD  ? 3 months ago Annual physical exam  ? [facility] Glean Hess, MD  ? 4 months ago Arthralgia of both knees  ? [facility] Glean Hess, MD  ? ?  ?  ?Future Appointments   ? ?        ? In 1 week Glean Hess, MD [facility], [place]  ? In 8 months [place] [facility] Jesse Sans, MD [facility], [place]  ? ?  ? ? ?  ?  ?  ? ?

## 2021-10-11 ENCOUNTER — Encounter: Payer: Self-pay | Admitting: Physical Therapy

## 2021-10-11 ENCOUNTER — Ambulatory Visit: Payer: Medicaid Other | Admitting: Physical Therapy

## 2021-10-11 DIAGNOSIS — M6281 Muscle weakness (generalized): Secondary | ICD-10-CM

## 2021-10-11 DIAGNOSIS — M545 Low back pain, unspecified: Secondary | ICD-10-CM

## 2021-10-11 DIAGNOSIS — M2560 Stiffness of unspecified joint, not elsewhere classified: Secondary | ICD-10-CM | POA: Diagnosis not present

## 2021-10-11 DIAGNOSIS — M4727 Other spondylosis with radiculopathy, lumbosacral region: Secondary | ICD-10-CM | POA: Diagnosis not present

## 2021-10-11 NOTE — Therapy (Incomplete)
?OUTPATIENT PHYSICAL THERAPY TREATMENT NOTE ? ? ?Patient Name: Christine Arellano ?MRN: [id number] ?DOB:[birth year], 31 y.o., female ?Today's Date: 10/11/2021 ? ?PCP: Glean Hess, MD ?REFERRING PROVIDER: Montel Culver, MD ? ? PT End of Session - 10/11/21 8338   ? ? Visit Number 6   ? Number of Visits 16   ? Date for PT Re-Evaluation 10/06/21   ? Authorization - Visit Number 6   ? Authorization - Number of Visits 10   ? PT Start Time [phone]   ? Activity Tolerance Patient tolerated treatment well   ? Behavior During Therapy [facility] for tasks assessed/performed   ? ?  ?  ? ?  ? ? ?Past Medical History:  ?Diagnosis Date  ? Acute midline thoracic back pain 05/05/2019  ? Anxiety   ? Depression   ? GERD (gastroesophageal reflux disease)   ? Headache   ? History of IBS   ? Insomnia   ? Knee pain 07/06/2016  ? Lumbar back pain   ? Migraine with aura   ? Mood disorder ([place]) 03/21/2016  ? normal karyotype after Abnormal chromosomal and genetic finding on antenatal screening mother   ? S/p amnio 06/24/2018- 46XX  SMA and CF pending   ? S/P cesarean section 02/24/2015  ? ?Past Surgical History:  ?Procedure Laterality Date  ? arthroscopic knee Right 2008  ? CESAREAN SECTION  2014  ? CESAREAN SECTION N/A 02/24/2015  ? Procedure: CESAREAN SECTION;  Surgeon: Rubie Maid, MD;  Location: [facility] ORS;  Service: Obstetrics;  Laterality: N/A;  ? CESAREAN SECTION N/A 05/09/2017  ? Procedure: REPEAT CESAREAN SECTION;  Surgeon: Rubie Maid, MD;  Location: [facility] ORS;  Service: Obstetrics;  Laterality: N/A;  ? HEMORROIDECTOMY    ? THERAPEUTIC ABORTION  08/2018  ? fetal CNS abnormalities  ? TONSILLECTOMY    ? WISDOM TOOTH EXTRACTION    ? ?Patient Active Problem List  ? Diagnosis Date Noted  ? Fibromyalgia 09/22/2021  ? Polyarthralgia 09/22/2021  ? Chronic sacroiliac joint pain 09/14/2021  ? B12 nutritional deficiency 08/30/2021  ? Lumbosacral spondylosis with radiculopathy 08/03/2021  ? Lumbar back pain 03/30/2021  ? BMI 33.0-33.9,adult 03/30/2021   ? Migraine with aura 01/26/2021  ? Inverted nipple 05/05/2019  ? Moderate episode of recurrent major depressive disorder ([place]) 05/05/2019  ? 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  ? Generalized anxiety disorder 03/04/2019  ? Alopecia of scalp 03/04/2019  ? Dry eye syndrome of both eyes 10/18/2017  ? Adult acne 05/03/2015  ? ? ?REFERRING DIAG: Lumbosacral spondylosis with radiculopathy ? ?THERAPY DIAG:  ?Lumbosacral spondylosis with radiculopathy ? ?Lumbar back pain ? ?Joint stiffness ? ?Muscle weakness (generalized) ? ?PERTINENT HISTORY: See evaluation ? ?PRECAUTIONS: N/A ? ?SUBJECTIVE:   Pt. Reports minimal low back pain.  Pt. C/o R UE/ hand aching and tingling with certain movement pattern.  No tingling in hand while riding Nustep.   ? ?PAIN:  ?Are you having pain? Yes: NPRS scale: 1/10 ?Pain location: low back (central) ?Pain description: sharp/ achy ?Aggravating factors: walking/ prolonged sitting/ stairs ?Relieving factors: rest ? ? ? ? ?TODAY'S TREATMENT:  ? ?Manual tx.: ?  ?Seated thoracic rotn. L/R (multiple cavitations noted).  ? ?Supine LE/lumbar generalized stretching 3x each.  Neural glides L/R foot.    ? ?Prone STM to low thoracic/lumbar paraspinals (use of Hypervolt)- tolerates well ?  ? ?Ther.ex.: ?  ?Nustep L4 10 min. B UE/LE (discussed weekend activities).   ? ?Walking in PT clinic with good posture/ normalized gait pattern ? ?Seated blue  ball ex. At //-bars/ mirror: pelvic tilts/ circles/ marching/ LAQ/ alt. UE and LE/ 3# chest press/ 3# shoulder flexion 10x2.   Thoracic/lumbar extension on blue ball with //-bar assist 3x.   ? ?Supine pelvic tilts/ reviewed HEP ? ? ? ?PATIENT EDUCATION: ?Education details: HEP ?Person educated: Patient ?Education method: Explanation, Demonstration, and Handouts ?Education comprehension: verbalized understanding and returned demonstration ? ? ?HOME EXERCISE PROGRAM: ?Issued pages #1-3 of TrA ex. program. Issued trial TENS unit for pain mgmt. ? ? ? ? PT Long Term Goals -    ? ?  ? PT LONG TERM GOAL #1  ? Title Pt. will increase FOTO to 57 to improve pain-free mobility.   ? Baseline Initial FOTO: 50   ? Time 8   ? Period Weeks   ? Status New   ? Target Date 10/06/21   ?  ? PT LONG TERM GOAL #2  ? Title Pt. independent with HEP to increase TrA muscle activation/ lumbar extension to decrease LE radicular symptoms.   ? Baseline B LE radicular symtoms.  Pain limited lumbar extension.  Pt. currently not participating with HEP.   ? Time 8   ? Period Weeks   ? Status New   ? Target Date 10/06/21   ?  ? PT LONG TERM GOAL #3  ? Title Pt. able to complete a 45 minute ther.ex. program with no increase c/o low back or radicular symptoms to improve functional activity.   ? Baseline pt. currently not participating with HEP   ? Time 8   ? Period Weeks   ? Status New   ? Target Date 10/06/21   ?  ? PT LONG TERM GOAL #4  ? Title Pt. will report no tenderness with palpation over lumbar paraspinals to improve pain-free mobility .   ? Baseline Moderate lumbar parapinal tendenress/ minimal hypomobility.   ? Time 8   ? Period Weeks   ? Status New   ? Target Date 10/06/21   ? ?  ?  ? ?  ? ? ? Plan -   ? ? Clinical Impression Statement No increase c/o low back pain t/o tx. Session and pt. Presents with good upright posture on blue ball.  Good hamstring/ lumbar mobility in supine position.  Multiple thoracic/lumbar spine cavitations during seated rotn.  B SI/ lumbar tenderness during STM and Hypervolt.   Good tx. Endurance with therex./ Nustep with no increase pain.  Pt. Will continue to benefit from skilled PT services to increase core/lumbar stability to improve pain-free mobility.    ? Personal Factors and Comorbidities Age;Sex;Finances;Comorbidity 3+;Profession   ? Examination-Activity Limitations Carry   ? Examination-Participation Restrictions Community Activity   ? Stability/Clinical Decision Making Evolving/Moderate complexity   ? Clinical Decision Making Moderate   ? Rehab Potential Good   ? PT  Frequency 2x / week   ? PT Duration 8 weeks   ? PT Treatment/Interventions ADLs/Self Care Home Management;Aquatic Therapy;Cryotherapy;Moist Heat;Electrical Stimulation;Gait training;Stair training;Functional mobility training;Neuromuscular re-education;Balance training;Therapeutic exercise;Therapeutic activities;Patient/family education;Manual techniques;Passive range of motion;Dry needling;Joint Manipulations;Spinal Manipulations   ? PT Next Visit Plan Discuss TENS unit and HEP progression.   ? Consulted and Agree with Plan of Care Patient   ? ?  ?  ? ?  ? ? ?Pura Spice, PT, DPT # [phone] ?10/11/2021, 9:52 AM ? ?   ?

## 2021-10-11 NOTE — Therapy (Signed)
?OUTPATIENT PHYSICAL THERAPY TREATMENT NOTE ? ? ?Patient Name: Christine Arellano ?MRN: [id number] ?DOB:10/07/1990, 31 y.o., female ?Today's Date: 10/11/2021 ? ?PCP: Glean Hess, MD ?REFERRING PROVIDER: Montel Culver, MD ? ? PT End of Session - 10/11/21 0737   ? ? Visit Number 6   ? Number of Visits 6   ? Date for PT Re-Evaluation 10/11/21   ? Authorization - Visit Number 6   ? Authorization - Number of Visits 10   ? PT Start Time [id number]   ? PT Stop Time 1047   ? PT Time Calculation (min) 59 min   ? Activity Tolerance Patient tolerated treatment well;Patient limited by pain   ? Behavior During Therapy [facility] for tasks assessed/performed   ? ?  ?  ? ?  ? ? ?Past Medical History:  ?Diagnosis Date  ? Acute midline thoracic back pain 05/05/2019  ? Anxiety   ? Depression   ? GERD (gastroesophageal reflux disease)   ? Headache   ? History of IBS   ? Insomnia   ? Knee pain 07/06/2016  ? Lumbar back pain   ? Migraine with aura   ? Mood disorder ([place]) 03/21/2016  ? normal karyotype after Abnormal chromosomal and genetic finding on antenatal screening mother   ? S/p amnio 06/24/2018- 46XX  SMA and CF pending   ? S/P cesarean section 02/24/2015  ? ?Past Surgical History:  ?Procedure Laterality Date  ? arthroscopic knee Right 2008  ? CESAREAN SECTION  2014  ? CESAREAN SECTION N/A 02/24/2015  ? Procedure: CESAREAN SECTION;  Surgeon: Rubie Maid, MD;  Location: [facility] ORS;  Service: Obstetrics;  Laterality: N/A;  ? CESAREAN SECTION N/A 05/09/2017  ? Procedure: REPEAT CESAREAN SECTION;  Surgeon: Rubie Maid, MD;  Location: [facility] ORS;  Service: Obstetrics;  Laterality: N/A;  ? HEMORROIDECTOMY    ? THERAPEUTIC ABORTION  08/2018  ? fetal CNS abnormalities  ? TONSILLECTOMY    ? WISDOM TOOTH EXTRACTION    ? ?Patient Active Problem List  ? Diagnosis Date Noted  ? Fibromyalgia 09/22/2021  ? Polyarthralgia 09/22/2021  ? Chronic sacroiliac joint pain 09/14/2021  ? B12 nutritional deficiency 08/30/2021  ? Lumbosacral spondylosis with  radiculopathy 08/03/2021  ? Lumbar back pain 03/30/2021  ? BMI 33.0-33.9,adult 03/30/2021  ? Migraine with aura 01/26/2021  ? Inverted nipple 05/05/2019  ? Moderate episode of recurrent major depressive disorder ([place]) 05/05/2019  ? 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  ? Generalized anxiety disorder 03/04/2019  ? Alopecia of scalp 03/04/2019  ? Dry eye syndrome of both eyes 10/18/2017  ? Adult acne 05/03/2015  ? ? ?REFERRING DIAG: Lumbosacral spondylosis with radiculopathy ? ?THERAPY DIAG:  ?Lumbosacral spondylosis with radiculopathy ? ?Lumbar back pain ? ?Joint stiffness ? ?Muscle weakness (generalized) ? ?PERTINENT HISTORY: See evaluation ? ?PRECAUTIONS: N/A ? ?SUBJECTIVE: Pt. Has not been back to PT over past month secondary to L/R SI injections/ family issues.  Pt. Reports her low back symptoms worsened for 2 weeks after injections.  Pt. States she is having more mid-back issues over past several weeks.  Pt. Scheduled for R knee PCL repair and will continue PT at [facility] per Dr. Sharlet Salina ? ?PAIN:  ?Are you having pain? Yes: NPRS scale: 4/10 ?Pain location: mid-low back/ B SI ?Pain description: sharp/ achy ?Aggravating factors: walking/ prolonged sitting/ stairs ?Relieving factors: rest ? ? ? ? ?TODAY'S TREATMENT:  ? ?Ther.ex.: ? ?Supine TrA ex.: marching/ hip abduction/ review of HEP.  Focus on manual tx. Today.  No Nustep ? ?  Manual tx.: ?  ?Seated thoracic rotn. L/R (multiple cavitations noted).  ? ?Supine LE/lumbar generalized stretching 3x each.  Neural glides L/R foot.    ? ?Prone STM to low thoracic/lumbar paraspinals (use of Hypervolt)- tolerates well (MH to back prior to mobs) ? ?Prone mid-thoracic/lumbar grade II-III PA mobs. 1x20 sec. Each (unilateral/central).  Tenderness reported (generalized).  ?  ? ? ?PATIENT EDUCATION: ?Education details: HEP ?Person educated: Patient ?Education method: Explanation, Demonstration, and Handouts ?Education comprehension: verbalized understanding and returned  demonstration ? ? ?HOME EXERCISE PROGRAM: ?Issued pages #1-3 of TrA ex. program. Issued trial TENS unit for pain mgmt. ? ? ? ? PT Long Term Goals -   ? ?  ? PT LONG TERM GOAL #1  ? Title Pt. will increase FOTO to 57 to improve pain-free mobility.   ? Baseline Initial FOTO: 50. 5/16: 56  ? Time 1  ? Period Weeks   ? Status Partially met  ? Target Date 10/11/21   ?  ? PT LONG TERM GOAL #2  ? Title Pt. independent with HEP to increase TrA muscle activation/ lumbar extension to decrease LE radicular symptoms.   ? Baseline B LE radicular symtoms.  Pain limited lumbar extension.  Pt. currently not participating with HEP.   ? Time 1  ? Period Weeks   ? Status Goal met  ? Target Date 10/11/21   ?  ? PT LONG TERM GOAL #3  ? Title Pt. able to complete a 45 minute ther.ex. program with no increase c/o low back or radicular symptoms to improve functional activity.   ? Baseline pt. currently not participating with HEP.  5/16: limited by pain  ? Time 1   ? Period Weeks   ? Status Partially met  ? Target Date 10/11/21   ?  ? PT LONG TERM GOAL #4  ? Title Pt. will report no tenderness with palpation over lumbar paraspinals to improve pain-free mobility .   ? Baseline Moderate lumbar parapinal tendenress/ minimal hypomobility.   ? Time 1   ? Period Weeks   ? Status Not met  ? Target Date 10/11/21   ? ?  ?  ? ? ? Plan -   ? ? Clinical Impression Statement Pt. Will be discharged from PT in [place] at this time secondary to upcoming R PCL surgery.  Pt. Will continue PT at [facility] starting next May 22nd.  Pt. Understands current TrA ex. And will continue on an independent basis.  No change to HEP.    ? Personal Factors and Comorbidities Age;Sex;Finances;Comorbidity 3+;Profession   ? Examination-Activity Limitations Carry   ? Examination-Participation Restrictions Community Activity   ? Stability/Clinical Decision Making Evolving/Moderate complexity   ? Clinical Decision Making Moderate   ? Rehab Potential Good   ? PT Frequency 1x /  week   ? PT Duration 1 week  ? PT Treatment/Interventions ADLs/Self Care Home Management;Aquatic Therapy;Cryotherapy;Moist Heat;Electrical Stimulation;Gait training;Stair training;Functional mobility training;Neuromuscular re-education;Balance training;Therapeutic exercise;Therapeutic activities;Patient/family education;Manual techniques;Passive range of motion;Dry needling;Joint Manipulations;Spinal Manipulations   ? PT Next Visit Plan Discharge  ? Consulted and Agree with Plan of Care Patient   ? ?  ?  ? ?  ? ? ?Pura Spice, PT, DPT # [phone] ?10/11/2021, 12:26 PM ? ?   ?

## 2021-10-13 ENCOUNTER — Encounter: Payer: Medicaid Other | Admitting: Physical Therapy

## 2021-10-14 DIAGNOSIS — M94261 Chondromalacia, right knee: Secondary | ICD-10-CM | POA: Diagnosis not present

## 2021-10-14 DIAGNOSIS — M794 Hypertrophy of (infrapatellar) fat pad: Secondary | ICD-10-CM | POA: Diagnosis not present

## 2021-10-14 DIAGNOSIS — M238X1 Other internal derangements of right knee: Secondary | ICD-10-CM | POA: Diagnosis not present

## 2021-10-14 DIAGNOSIS — S83521A Sprain of posterior cruciate ligament of right knee, initial encounter: Secondary | ICD-10-CM | POA: Diagnosis not present

## 2021-10-14 DIAGNOSIS — S83281A Other tear of lateral meniscus, current injury, right knee, initial encounter: Secondary | ICD-10-CM | POA: Diagnosis not present

## 2021-10-14 HISTORY — PX: ARTHROSCOPIC REPAIR PCL: SUR81

## 2021-10-17 DIAGNOSIS — S83521D Sprain of posterior cruciate ligament of right knee, subsequent encounter: Secondary | ICD-10-CM | POA: Diagnosis not present

## 2021-10-17 DIAGNOSIS — J3489 Other specified disorders of nose and nasal sinuses: Secondary | ICD-10-CM | POA: Diagnosis not present

## 2021-10-17 DIAGNOSIS — J328 Other chronic sinusitis: Secondary | ICD-10-CM | POA: Diagnosis not present

## 2021-10-17 DIAGNOSIS — J342 Deviated nasal septum: Secondary | ICD-10-CM | POA: Diagnosis not present

## 2021-10-18 ENCOUNTER — Ambulatory Visit: Payer: Medicaid Other | Admitting: Internal Medicine

## 2021-10-18 ENCOUNTER — Encounter: Payer: Medicaid Other | Admitting: Physical Therapy

## 2021-10-20 ENCOUNTER — Encounter: Payer: Medicaid Other | Admitting: Physical Therapy

## 2021-10-25 ENCOUNTER — Encounter: Payer: Medicaid Other | Admitting: Physical Therapy

## 2021-10-27 ENCOUNTER — Encounter: Payer: Medicaid Other | Admitting: Physical Therapy

## 2021-10-27 DIAGNOSIS — M25661 Stiffness of right knee, not elsewhere classified: Secondary | ICD-10-CM | POA: Diagnosis not present

## 2021-10-27 DIAGNOSIS — M25561 Pain in right knee: Secondary | ICD-10-CM | POA: Diagnosis not present

## 2021-10-27 DIAGNOSIS — S83521D Sprain of posterior cruciate ligament of right knee, subsequent encounter: Secondary | ICD-10-CM | POA: Diagnosis not present

## 2021-10-27 DIAGNOSIS — J342 Deviated nasal septum: Secondary | ICD-10-CM | POA: Diagnosis not present

## 2021-10-31 ENCOUNTER — Encounter: Payer: Self-pay | Admitting: Internal Medicine

## 2021-10-31 ENCOUNTER — Ambulatory Visit: Payer: Medicaid Other | Admitting: Internal Medicine

## 2021-10-31 ENCOUNTER — Other Ambulatory Visit: Payer: Self-pay | Admitting: Internal Medicine

## 2021-10-31 VITALS — BP 128/74 | HR 73 | Ht 62.0 in | Wt 173.0 lb

## 2021-10-31 DIAGNOSIS — M797 Fibromyalgia: Secondary | ICD-10-CM

## 2021-10-31 DIAGNOSIS — Z308 Encounter for other contraceptive management: Secondary | ICD-10-CM

## 2021-10-31 DIAGNOSIS — E538 Deficiency of other specified B group vitamins: Secondary | ICD-10-CM

## 2021-10-31 DIAGNOSIS — M4727 Other spondylosis with radiculopathy, lumbosacral region: Secondary | ICD-10-CM

## 2021-10-31 DIAGNOSIS — Z6831 Body mass index (BMI) 31.0-31.9, adult: Secondary | ICD-10-CM

## 2021-10-31 DIAGNOSIS — G250 Essential tremor: Secondary | ICD-10-CM

## 2021-10-31 DIAGNOSIS — L700 Acne vulgaris: Secondary | ICD-10-CM | POA: Diagnosis not present

## 2021-10-31 MED ORDER — PHENTERMINE HCL 37.5 MG PO CAPS: 37.5000 mg | ORAL_CAPSULE | ORAL | 1 refills | Status: DC

## 2021-10-31 NOTE — Progress Notes (Signed)
Date:  10/31/2021   Name:  Christine Arellano   DOB:  07-26-1990   MRN:  [id number]   Chief Complaint: No chief complaint on file. Weight management - pt is here for weight management follow up.  Started on 08/30/21 on phentermine.  Doing well without side effects.  Starting weight 192 lbs.   Today's weight 173lb lbs.  Current diet is limited calories and current exercise routine is none due to knee surgery..  B12 deficiency - started on oral B12 last visit.  Here for recheck today.  Back Pain This is a recurrent problem. The problem has been waxing and waning since onset. The pain is present in the lumbar spine. The quality of the pain is described as burning. Radiates to: left buttock. The pain is moderate. Pertinent negatives include no abdominal pain, chest pain, headaches or weakness. Treatments tried: ESI temporarily helped but only for a few weeks - has not tried again.  Tremor - she notes a tremor for years.  Worse at times than others.  Recently worse but she has been stressed out - father of her three children kicked her out of the house in favor of a new girlfriend She is staying with her mother and her father is helping with bills.  She just had knee surgery so can not work.  Previously worked Surveyor, mining. Lab Results  Component Value Date   NA 136 06/29/2021   K 4.6 06/29/2021   CO2 22 06/29/2021   GLUCOSE 83 06/29/2021   BUN 13 06/29/2021   CREATININE 0.74 06/29/2021   CALCIUM 10.0 06/29/2021   EGFR 110 06/29/2021   GFRNONAA 134 05/31/2018   Lab Results  Component Value Date   CHOL 259 (H) 06/29/2021   HDL 87 06/29/2021   LDLCALC 141 (H) 06/29/2021   TRIG 179 (H) 06/29/2021   CHOLHDL 3.0 06/29/2021   Lab Results  Component Value Date   TSH 1.920 06/29/2021   No results found for: HGBA1C Lab Results  Component Value Date   WBC 7.8 06/29/2021   HGB 13.9 06/29/2021   HCT 42.1 06/29/2021   MCV 87 06/29/2021   PLT 344 06/29/2021   Lab Results  Component Value Date   ALT  12 06/29/2021   AST 18 06/29/2021   ALKPHOS 63 06/29/2021   BILITOT 0.4 06/29/2021   Lab Results  Component Value Date   VD25OH 33.2 11/06/2018    Lab Results  Component Value Date   VITAMINB12 189 08/07/2021    Review of Systems  Constitutional:  Positive for unexpected weight change (lost 19 lbs.). Negative for fatigue.  HENT:  Negative for nosebleeds.   Eyes:  Negative for visual disturbance.  Respiratory:  Negative for cough, chest tightness, shortness of breath and wheezing.   Cardiovascular:  Negative for chest pain, palpitations and leg swelling.  Gastrointestinal:  Negative for abdominal pain, constipation and diarrhea.  Musculoskeletal:  Positive for back pain.  Neurological:  Positive for tremors. Negative for dizziness, weakness, light-headedness and headaches.  Psychiatric/Behavioral:  Positive for dysphoric mood and sleep disturbance. The patient is nervous/anxious.    Patient Active Problem List   Diagnosis Date Noted   Fibromyalgia 09/22/2021   Polyarthralgia 09/22/2021   Chronic sacroiliac joint pain 09/14/2021   B12 nutritional deficiency 08/30/2021   Lumbosacral spondylosis with radiculopathy 08/03/2021   Lumbar back pain 03/30/2021   BMI 33.0-33.9,adult 03/30/2021   Migraine with aura 01/26/2021   Inverted nipple 05/05/2019   Moderate episode of recurrent major  depressive disorder ([place]) 05/05/2019   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   Generalized anxiety disorder 03/04/2019   Alopecia of scalp 03/04/2019   Dry eye syndrome of both eyes 10/18/2017   Adult acne 05/03/2015    No Known Allergies  Past Surgical History:  Procedure Laterality Date   arthroscopic knee Right 2008   CESAREAN SECTION  2014   CESAREAN SECTION N/A 02/24/2015   Procedure: CESAREAN SECTION;  Surgeon: Rubie Maid, MD;  Location: [facility] ORS;  Service: Obstetrics;  Laterality: N/A;   CESAREAN SECTION N/A 05/09/2017   Procedure: REPEAT CESAREAN SECTION;  Surgeon: Rubie Maid, MD;   Location: [facility] ORS;  Service: Obstetrics;  Laterality: N/A;   HEMORROIDECTOMY     THERAPEUTIC ABORTION  08/2018   fetal CNS abnormalities   TONSILLECTOMY     WISDOM TOOTH EXTRACTION      Social History   Tobacco Use   Smoking status: Former    Packs/day: 1.00    Types: Cigarettes    Quit date: 08/27/2009    Years since quitting: 12.1   Smokeless tobacco: Never  Vaping Use   Vaping Use: Never used  Substance Use Topics   Alcohol use: Not Currently    Alcohol/week: 0.0 standard drinks   Drug use: No     Medication list has been reviewed and updated.  Current Meds  Medication Sig   ammonium lactate (LAC-HYDRIN) 12 % lotion Apply topically.   celecoxib (CELEBREX) 200 MG capsule One to 2 tablets by mouth daily as needed for pain.   FLUoxetine HCl 60 MG TABS fluoxetine 60 mg tablet  TAKE 1 TABLET BY MOUTH EVERY DAY IN THE MORNING   fluticasone (FLONASE) 50 MCG/ACT nasal spray SPRAY 2 SPRAYS INTO EACH NOSTRIL EVERY DAY   HIBICLENS 4 % external liquid APPLY TO AFFECTED AREA TWICE A DAY   hydrOXYzine (VISTARIL) 25 MG capsule 50 mg.   ISOtretinoin (ACCUTANE) 40 MG capsule Claravis 40 mg capsule  TAKE ONE CAPSULE BY MOUTH EVERY DAY WITH A FATTY MEAL   minoxidil (LONITEN) 2.5 MG tablet Take 1.25 mg by mouth 2 (two) times daily.   norgestimate-ethinyl estradiol (ORTHO-CYCLEN) 0.25-35 MG-MCG tablet TAKE 1 TABLET BY MOUTH EVERY DAY   nystatin ointment (MYCOSTATIN) Apply 1 application topically 2 (two) times daily.   oxyCODONE (OXY IR/ROXICODONE) 5 MG immediate release tablet Take 5 mg by mouth.   phentermine 37.5 MG capsule Take 1 capsule (37.5 mg total) by mouth every morning.   spironolactone (ALDACTONE) 100 MG tablet Take 100 mg by mouth daily.   tiZANidine (ZANAFLEX) 4 MG tablet TAKE 1 TABLET BY MOUTH EVERYDAY AT BEDTIME   traZODone (DESYREL) 100 MG tablet Take 100 mg by mouth at bedtime as needed.   vitamin B-12 (CYANOCOBALAMIN) 1000 MCG tablet Take 1 tablet (1,000 mcg total) by  mouth daily.       09/14/2021    9:26 AM 08/30/2021    8:09 AM 08/03/2021    8:48 AM 06/29/2021    8:57 AM  GAD 7 : Generalized Anxiety Score  Nervous, Anxious, on Edge 2 3 1 3   Control/stop worrying 1 1 2 3   Worry too much - different things 2 1 2 3   Trouble relaxing 1 0 2 1  Restless 0 1 1 0  Easily annoyed or irritable 2 2 3 1   Afraid - awful might happen 1 0 0 0  Total GAD 7 Score 9 8 11 11   Anxiety Difficulty Somewhat difficult Somewhat difficult Extremely difficult  09/14/2021    9:26 AM  Depression screen PHQ 2/9  Decreased Interest 3  Down, Depressed, Hopeless 3  PHQ - 2 Score 6  Altered sleeping 3  Tired, decreased energy 3  Change in appetite 3  Feeling bad or failure about yourself  2  Trouble concentrating 0  Moving slowly or fidgety/restless 0  Suicidal thoughts 1  PHQ-9 Score 18  Difficult doing work/chores Extremely dIfficult    BP Readings from Last 3 Encounters:  10/31/21 128/74  09/14/21 118/78  08/30/21 128/78    Physical Exam Vitals and nursing note reviewed.  Constitutional:      General: She is not in acute distress.    Appearance: Normal appearance. She is well-developed.  HENT:     Head: Normocephalic and atraumatic.  Cardiovascular:     Rate and Rhythm: Normal rate and regular rhythm.     Pulses: Normal pulses.     Heart sounds: No murmur heard. Pulmonary:     Effort: Pulmonary effort is normal. No respiratory distress.     Breath sounds: No wheezing or rhonchi.  Musculoskeletal:        General: No swelling.     Cervical back: Normal range of motion.     Right lower leg: No edema.     Left lower leg: No edema.     Comments: Long leg Brace on right knee  Lymphadenopathy:     Cervical: No cervical adenopathy.  Skin:    General: Skin is warm and dry.     Capillary Refill: Capillary refill takes less than 2 seconds.     Findings: No rash.  Neurological:     General: No focal deficit present.     Mental Status: She is alert  and oriented to person, place, and time.     Motor: Tremor (worse with movement - both UEs) present.  Psychiatric:        Mood and Affect: Mood normal.        Behavior: Behavior normal.    Wt Readings from Last 3 Encounters:  10/31/21 173 lb (78.5 kg)  09/14/21 184 lb 12.8 oz (83.8 kg)  08/30/21 192 lb 9.6 oz (87.4 kg)    BP 128/74   Pulse 73   Ht 5' 2"  (1.575 m)   Wt 173 lb (78.5 kg)   SpO2 97%   BMI 31.64 kg/m   Assessment and Plan: 1. B12 nutritional deficiency Continue oral B12 and check levels - Vitamin B12 - CBC with Differential/Platelet  2. BMI 31.0-31.9,adult Doing well on Phentermine - phentermine 37.5 MG capsule; Take 1 capsule (37.5 mg total) by mouth every morning.  Dispense: 30 capsule; Refill: 1  3. Tremor, essential Long standing tremor - recently worse but does not interfere with ADLs Will continue to montor  4. Fibromyalgia Seen by Rheum - Naltrexone trial was too expensive Discuss at next follow up with Rheum. - Comprehensive metabolic panel  5. Lumbosacral spondylosis with radiculopathy Seeing pain management The first injections did not help for long - encourage her to try again and she may have benefit with the next injection.   Partially dictated using Editor, commissioning. Any errors are unintentional.  [name], MD [facility]  10/31/2021

## 2021-11-01 ENCOUNTER — Encounter: Payer: Medicaid Other | Admitting: Physical Therapy

## 2021-11-01 LAB — COMPREHENSIVE METABOLIC PANEL
ALT: 17 IU/L (ref 0–32)
AST: 18 IU/L (ref 0–40)
Albumin/Globulin Ratio: 1.7 (ref 1.2–2.2)
Albumin: 4.7 g/dL (ref 3.8–4.8)
Alkaline Phosphatase: 80 IU/L (ref 44–121)
BUN/Creatinine Ratio: 18 (ref 9–23)
BUN: 14 mg/dL (ref 6–20)
Bilirubin Total: <0.2 mg/dL (ref 0.0–1.2)
CO2: 19 mmol/L — ABNORMAL LOW (ref 20–29)
Calcium: 9.7 mg/dL (ref 8.7–10.2)
Chloride: 104 mmol/L (ref 96–106)
Creatinine, Ser: 0.76 mg/dL (ref 0.57–1.00)
Globulin, Total: 2.8 g/dL (ref 1.5–4.5)
Glucose: 101 mg/dL — ABNORMAL HIGH (ref 70–99)
Potassium: 4.3 mmol/L (ref 3.5–5.2)
Sodium: 139 mmol/L (ref 134–144)
Total Protein: 7.5 g/dL (ref 6.0–8.5)
eGFR: 106 mL/min/{1.73_m2} (ref >59)

## 2021-11-01 LAB — CBC WITH DIFFERENTIAL/PLATELET
Basophils Absolute: 0 10*3/uL (ref 0.0–0.2)
Basos: 0 %
EOS (ABSOLUTE): 0 10*3/uL (ref 0.0–0.4)
Eos: 0 %
Hematocrit: 37.3 % (ref 34.0–46.6)
Hemoglobin: 12.9 g/dL (ref 11.1–15.9)
Immature Grans (Abs): 0 10*3/uL (ref 0.0–0.1)
Immature Granulocytes: 0 %
Lymphocytes Absolute: 0.9 10*3/uL (ref 0.7–3.1)
Lymphs: 10 %
MCH: 30.1 pg (ref 26.6–33.0)
MCHC: 34.6 g/dL (ref 31.5–35.7)
MCV: 87 fL (ref 79–97)
Monocytes Absolute: 0.5 10*3/uL (ref 0.1–0.9)
Monocytes: 5 %
Neutrophils Absolute: 7.7 10*3/uL — ABNORMAL HIGH (ref 1.4–7.0)
Neutrophils: 85 %
Platelets: 379 10*3/uL (ref 150–450)
RBC: 4.28 x10E6/uL (ref 3.77–5.28)
RDW: 12.8 % (ref 11.7–15.4)
WBC: 9.2 10*3/uL (ref 3.4–10.8)

## 2021-11-01 LAB — VITAMIN B12: Vitamin B-12: >2000 pg/mL — ABNORMAL HIGH (ref 232–1245)

## 2021-11-01 NOTE — Telephone Encounter (Signed)
Requested medication (s) are due for refill today: yes  Requested medication (s) are on the active medication list: yes  Last refill:  10/05/21 #30/0  Future visit scheduled: yes  Notes to clinic:  Unable to refill per protocol, cannot delegate.    Requested Prescriptions  Pending Prescriptions Disp Refills   tiZANidine (ZANAFLEX) 4 MG tablet [Pharmacy Med Name: TIZANIDINE HCL 4 MG TABLET] 30 tablet 0    Sig: TAKE 1 TABLET BY MOUTH EVERYDAY AT BEDTIME     Not Delegated - Cardiovascular:  Alpha-2 Agonists - tizanidine Failed - 10/31/2021 12:39 PM      Failed - This refill cannot be delegated      Passed - Valid encounter within last 6 months    Recent Outpatient Visits           Yesterday B12 nutritional deficiency   [facility] Glean Hess, MD   1 month ago Chronic sacroiliac joint pain   [facility] Montel Culver, MD   2 months ago B12 nutritional deficiency   [facility] Glean Hess, MD   3 months ago Lumbosacral spondylosis with radiculopathy   [facility] Montel Culver, MD   4 months ago Annual physical exam   [facility] Glean Hess, MD       Future Appointments             In 2 months Army Melia Jesse Sans, MD [facility], [place]   In 8 months Army Melia, Jesse Sans, MD [facility], [place]             Signed Prescriptions Disp Refills   norgestimate-ethinyl estradiol (ORTHO-CYCLEN) 0.25-35 MG-MCG tablet 84 tablet 1    Sig: TAKE 1 TABLET BY MOUTH EVERY DAY     OB/GYN:  Contraceptives Passed - 10/31/2021 12:39 PM      Passed - Last BP in normal range    BP Readings from Last 1 Encounters:  10/31/21 128/74         Passed - Valid encounter within last 12 months    Recent Outpatient Visits           Yesterday B12 nutritional deficiency   [facility] Glean Hess, MD   1 month ago Chronic sacroiliac joint pain   [facility] Montel Culver, MD   2  months ago B12 nutritional deficiency   [facility] Glean Hess, MD   3 months ago Lumbosacral spondylosis with radiculopathy   [facility] Montel Culver, MD   4 months ago Annual physical exam   [facility] Glean Hess, MD       Future Appointments             In 2 months Army Melia Jesse Sans, MD [facility], [place]   In 8 months Army Melia Jesse Sans, MD [facility], [place] - Patient is not a smoker

## 2021-11-01 NOTE — Telephone Encounter (Signed)
Requested Prescriptions  Pending Prescriptions Disp Refills  . tiZANidine (ZANAFLEX) 4 MG tablet [Pharmacy Med Name: TIZANIDINE HCL 4 MG TABLET] 30 tablet 0    Sig: TAKE 1 TABLET BY MOUTH EVERYDAY AT BEDTIME     Not Delegated - Cardiovascular:  Alpha-2 Agonists - tizanidine Failed - 10/31/2021 12:39 PM      Failed - This refill cannot be delegated      Passed - Valid encounter within last 6 months    Recent Outpatient Visits          Yesterday B12 nutritional deficiency   [facility] Glean Hess, MD   1 month ago Chronic sacroiliac joint pain   [facility] Montel Culver, MD   2 months ago B12 nutritional deficiency   [facility] Glean Hess, MD   3 months ago Lumbosacral spondylosis with radiculopathy   [facility] Montel Culver, MD   4 months ago Annual physical exam   [facility] Glean Hess, MD      Future Appointments            In 2 months Army Melia Jesse Sans, MD [facility], [place]   In 8 months Army Melia, Jesse Sans, MD [facility], [place]           . norgestimate-ethinyl estradiol (ORTHO-CYCLEN) 0.25-35 MG-MCG tablet [Pharmacy Med Name: [place] 0.25-0.035 MG] 84 tablet 1    Sig: TAKE 1 TABLET BY MOUTH EVERY DAY     OB/GYN:  Contraceptives Passed - 10/31/2021 12:39 PM      Passed - Last BP in normal range    BP Readings from Last 1 Encounters:  10/31/21 128/74         Passed - Valid encounter within last 12 months    Recent Outpatient Visits          Yesterday B12 nutritional deficiency   [facility] Glean Hess, MD   1 month ago Chronic sacroiliac joint pain   [facility] Montel Culver, MD   2 months ago B12 nutritional deficiency   [facility] Glean Hess, MD   3 months ago Lumbosacral spondylosis with radiculopathy   [facility] Montel Culver, MD   4 months ago Annual physical exam   [facility]  Glean Hess, MD      Future Appointments            In 2 months Army Melia Jesse Sans, MD [facility], [place]   In 8 months Army Melia Jesse Sans, MD [facility], [place] - Patient is not a smoker

## 2021-11-03 ENCOUNTER — Encounter: Payer: Medicaid Other | Admitting: Physical Therapy

## 2021-11-08 ENCOUNTER — Encounter: Payer: Medicaid Other | Admitting: Physical Therapy

## 2021-11-09 DIAGNOSIS — M797 Fibromyalgia: Secondary | ICD-10-CM | POA: Diagnosis not present

## 2021-11-09 DIAGNOSIS — M25561 Pain in right knee: Secondary | ICD-10-CM | POA: Diagnosis not present

## 2021-11-09 DIAGNOSIS — M25661 Stiffness of right knee, not elsewhere classified: Secondary | ICD-10-CM | POA: Diagnosis not present

## 2021-11-10 ENCOUNTER — Encounter: Payer: Medicaid Other | Admitting: Physical Therapy

## 2021-11-14 DIAGNOSIS — J342 Deviated nasal septum: Secondary | ICD-10-CM | POA: Diagnosis not present

## 2021-11-14 DIAGNOSIS — J3489 Other specified disorders of nose and nasal sinuses: Secondary | ICD-10-CM | POA: Diagnosis not present

## 2021-11-16 DIAGNOSIS — M25661 Stiffness of right knee, not elsewhere classified: Secondary | ICD-10-CM | POA: Diagnosis not present

## 2021-11-16 DIAGNOSIS — M25561 Pain in right knee: Secondary | ICD-10-CM | POA: Diagnosis not present

## 2021-11-22 ENCOUNTER — Other Ambulatory Visit: Payer: Self-pay

## 2021-11-22 ENCOUNTER — Ambulatory Visit: Payer: Self-pay | Admitting: *Deleted

## 2021-11-22 ENCOUNTER — Telehealth: Payer: Self-pay | Admitting: Internal Medicine

## 2021-11-22 MED ORDER — POLYETHYLENE GLYCOL 3350 17 GM/SCOOP PO POWD: 17.0000 g | Freq: Two times a day (BID) | ORAL | 1 refills | Status: DC | PRN

## 2021-11-22 NOTE — Telephone Encounter (Signed)
Sent in miralax to [facility].

## 2021-11-24 DIAGNOSIS — M25661 Stiffness of right knee, not elsewhere classified: Secondary | ICD-10-CM | POA: Diagnosis not present

## 2021-11-24 DIAGNOSIS — M25561 Pain in right knee: Secondary | ICD-10-CM | POA: Diagnosis not present

## 2021-11-27 ENCOUNTER — Other Ambulatory Visit: Payer: Self-pay | Admitting: Internal Medicine

## 2021-11-28 NOTE — Telephone Encounter (Signed)
Requested medications are due for refill today.  yes  Requested medications are on the active medications list.  yes  Last refill. 11/01/2021 #30 0 refills  Future visit scheduled.   yes  Notes to clinic.  Refill not delegated.    Requested Prescriptions  Pending Prescriptions Disp Refills   tiZANidine (ZANAFLEX) 4 MG tablet [Pharmacy Med Name: TIZANIDINE HCL 4 MG TABLET] 30 tablet 0    Sig: TAKE 1 TABLET BY MOUTH EVERYDAY AT BEDTIME     Not Delegated - Cardiovascular:  Alpha-2 Agonists - tizanidine Failed - 11/27/2021  1:30 PM      Failed - This refill cannot be delegated      Passed - Valid encounter within last 6 months    Recent Outpatient Visits           4 weeks ago B12 nutritional deficiency   [facility] Glean Hess, MD   2 months ago Chronic sacroiliac joint pain   [facility] Montel Culver, MD   3 months ago B12 nutritional deficiency   [facility] Glean Hess, MD   3 months ago Lumbosacral spondylosis with radiculopathy   [facility] Montel Culver, MD   5 months ago Annual physical exam   [facility] Glean Hess, MD       Future Appointments             In 1 month Army Melia Jesse Sans, MD [facility], [place]   In 7 months Army Melia, Jesse Sans, MD [facility], [facility]

## 2021-12-01 DIAGNOSIS — M25561 Pain in right knee: Secondary | ICD-10-CM | POA: Diagnosis not present

## 2021-12-01 DIAGNOSIS — M25661 Stiffness of right knee, not elsewhere classified: Secondary | ICD-10-CM | POA: Diagnosis not present

## 2021-12-08 DIAGNOSIS — M25561 Pain in right knee: Secondary | ICD-10-CM | POA: Diagnosis not present

## 2021-12-08 DIAGNOSIS — M25661 Stiffness of right knee, not elsewhere classified: Secondary | ICD-10-CM | POA: Diagnosis not present

## 2021-12-15 DIAGNOSIS — M25661 Stiffness of right knee, not elsewhere classified: Secondary | ICD-10-CM | POA: Diagnosis not present

## 2021-12-15 DIAGNOSIS — M25561 Pain in right knee: Secondary | ICD-10-CM | POA: Diagnosis not present

## 2021-12-19 DIAGNOSIS — J342 Deviated nasal septum: Secondary | ICD-10-CM | POA: Diagnosis not present

## 2021-12-19 DIAGNOSIS — J3489 Other specified disorders of nose and nasal sinuses: Secondary | ICD-10-CM | POA: Diagnosis not present

## 2021-12-20 ENCOUNTER — Other Ambulatory Visit: Payer: Self-pay | Admitting: Internal Medicine

## 2021-12-20 DIAGNOSIS — R0981 Nasal congestion: Secondary | ICD-10-CM

## 2021-12-21 NOTE — Telephone Encounter (Signed)
Requested Prescriptions  Pending Prescriptions Disp Refills  . fluticasone (FLONASE) 50 MCG/ACT nasal spray [Pharmacy Med Name: FLUTICASONE PROP 50 MCG SPRAY] 16 mL 2    Sig: SPRAY 2 SPRAYS INTO EACH NOSTRIL EVERY DAY     Ear, Nose, and Throat: Nasal Preparations - Corticosteroids Passed - 12/20/2021 11:02 AM      Passed - Valid encounter within last 12 months    Recent Outpatient Visits          1 month ago B12 nutritional deficiency   [facility] Glean Hess, MD   3 months ago Chronic sacroiliac joint pain   [facility] Montel Culver, MD   3 months ago B12 nutritional deficiency   [facility] Glean Hess, MD   4 months ago Lumbosacral spondylosis with radiculopathy   [facility] Montel Culver, MD   5 months ago Annual physical exam   [facility] Glean Hess, MD      Future Appointments            In 3 weeks Army Melia Jesse Sans, MD [facility], [place]   In 6 months Army Melia, Jesse Sans, MD [facility], [facility]

## 2021-12-22 DIAGNOSIS — Z79899 Other long term (current) drug therapy: Secondary | ICD-10-CM | POA: Diagnosis not present

## 2021-12-22 DIAGNOSIS — M25661 Stiffness of right knee, not elsewhere classified: Secondary | ICD-10-CM | POA: Diagnosis not present

## 2021-12-22 DIAGNOSIS — L700 Acne vulgaris: Secondary | ICD-10-CM | POA: Diagnosis not present

## 2021-12-22 DIAGNOSIS — M25561 Pain in right knee: Secondary | ICD-10-CM | POA: Diagnosis not present

## 2021-12-23 ENCOUNTER — Other Ambulatory Visit: Payer: Self-pay | Admitting: Internal Medicine

## 2021-12-26 NOTE — Telephone Encounter (Signed)
Requested medication (s) are due for refill today: yes  Requested medication (s) are on the active medication list:yes  Last refill:  11/30/21  Future visit scheduled:yes  Notes to clinic:  Unable to refill per protocol, cannot delegate zanaflex.      Requested Prescriptions  Pending Prescriptions Disp Refills   tiZANidine (ZANAFLEX) 4 MG tablet [Pharmacy Med Name: TIZANIDINE HCL 4 MG TABLET] 30 tablet 0    Sig: TAKE 1 TABLET BY MOUTH EVERYDAY AT BEDTIME     Not Delegated - Cardiovascular:  Alpha-2 Agonists - tizanidine Failed - 12/23/2021  1:31 PM      Failed - This refill cannot be delegated      Passed - Valid encounter within last 6 months    Recent Outpatient Visits           1 month ago B12 nutritional deficiency   [facility] Glean Hess, MD   3 months ago Chronic sacroiliac joint pain   [facility] Montel Culver, MD   3 months ago B12 nutritional deficiency   [facility] Glean Hess, MD   4 months ago Lumbosacral spondylosis with radiculopathy   [facility] Montel Culver, MD   6 months ago Annual physical exam   [facility] Glean Hess, MD       Future Appointments             In 2 weeks Army Melia Jesse Sans, MD [facility], [place]   In 6 months Army Melia, Jesse Sans, MD [facility], [facility]

## 2021-12-26 NOTE — Telephone Encounter (Signed)
Can you help with this refill?

## 2021-12-28 ENCOUNTER — Encounter: Payer: Self-pay | Admitting: Otolaryngology

## 2021-12-28 ENCOUNTER — Other Ambulatory Visit: Payer: Self-pay

## 2022-01-02 NOTE — Discharge Instructions (Signed)
[facility] ENDOSCOPIC SINUS SURGERY [facility]  What is Functional Endoscopic Sinus Surgery?  The Surgery involves making the natural openings of the sinuses larger by removing the bony partitions that separate the sinuses from the nasal cavity.  The natural sinus lining is preserved as much as possible to allow the sinuses to resume normal function after the surgery.  In some patients nasal polyps (excessively swollen lining of the sinuses) may be removed to relieve obstruction of the sinus openings.  The surgery is performed through the nose using lighted scopes, which eliminates the need for incisions on the face.  A septoplasty is a different procedure which is sometimes performed with sinus surgery.  It involves straightening the boy partition that separates the two sides of your nose.  A crooked or deviated septum may need repair if is obstructing the sinuses or nasal airflow.  Turbinate reduction is also often performed during sinus surgery.  The turbinates are bony proturberances from the side walls of the nose which swell and can obstruct the nose in patients with sinus and allergy problems.  Their size can be surgically reduced to help relieve nasal obstruction.  What Can Sinus Surgery Do For Me?  Sinus surgery can reduce the frequency of sinus infections requiring antibiotic treatment.  This can provide improvement in nasal congestion, post-nasal drainage, facial pressure and nasal obstruction.  Surgery will NOT prevent you from ever having an infection again, so it usually only for patients who get infections 4 or more times yearly requiring antibiotics, or for infections that do not clear with antibiotics.  It will not cure nasal allergies, so patients with allergies may still require medication to treat their allergies after surgery. Surgery may improve headaches related to sinusitis, however, some people will continue to  require medication to control sinus headaches related to allergies.  Surgery will do nothing for other forms of headache (migraine, tension or cluster).  What Are the Risks of Endoscopic Sinus Surgery?  Current techniques allow surgery to be performed safely with little risk, however, there are rare complications that patients should be aware of.  Because the sinuses are located around the eyes, there is risk of eye injury, including blindness, though again, this would be quite rare. This is usually a result of bleeding behind the eye during surgery, which can effect vision, though there are treatments to protect the vision and prevent permanent injury. More serious complications would include bleeding inside the brain cavity or damage to the brain.This happens when the fluid around the brain leaks out into the sinus cavity.  Again, all of these complications are uncommon, and spinal fluid leaks can be safely managed surgically if they occur.  The most common complication of sinus surgery is bleeding from the nose, which may require packing or cauterization of the nose.  Patients with polyps may experience recurrence of the polyps that would require revision surgery.  Alterations of sense of smell or injury to the tear ducts are also rare complications.   What is the Surgery Like, and what is the Recovery?  The Surgery usually takes a couple of hours to perform, and is usually performed under a general anesthetic (completely asleep).  Patients are usually discharged home after a couple of hours.  Sometimes during surgery it is necessary to pack the nose to control bleeding, and the packing is left in place for 24 - 48 hours, and removed by your surgeon.  If   a septoplasty was performed during the procedure, there is often a splint placed which must be removed after 5-7 days.   Discomfort: Pain is usually mild to moderate, and can be controlled by prescription pain medication or acetaminophen (Tylenol).   Aspirin, Ibuprofen (Advil, Motrin), or Naprosyn (Aleve) should be avoided, as they can cause increased bleeding.  Most patients feel sinus pressure like they have a bad head cold for several days.  Sleeping with your head elevated can help reduce swelling and facial pressure, as can ice packs over the face.  A humidifier may be helpful to keep the mucous and blood from drying in the nose.   Diet: There are no specific diet restrictions, however, you should generally start with clear liquids and a light diet of bland foods because the anesthetic can cause some nausea.  Advance your diet depending on how your stomach feels.  Taking your pain medication with food will often help reduce stomach upset which pain medications can cause.  Nasal Saline Irrigation: It is important to remove blood clots and dried mucous from the nose as it is healing.  This is done by having you irrigate the nose at least 3 - 4 times daily with a salt water solution.  We recommend using NeilMed Sinus Rinse (available at the drug store).  Fill the squeeze bottle with the solution, bend over a sink, and insert the tip of the squeeze bottle into the nose  of an inch.  Point the tip of the squeeze bottle towards the inside corner of the eye on the same side your irrigating.  Squeeze the bottle and gently irrigate the nose.  If you bend forward as you do this, most of the fluid will flow back out of the nose, instead of down your throat.   The solution should be warm, near body temperature, when you irrigate.   Each time you irrigate, you should use a full squeeze bottle.   Note that if you are instructed to use Nasal Steroid Sprays at any time after your surgery, irrigate with saline BEFORE using the steroid spray, so you do not wash it all out of the nose. Another product, Nasal Saline Gel (such as AYR Nasal Saline Gel) can be applied in each nostril 3 - 4 times daily to moisture the nose and reduce scabbing or crusting.  Bleeding:   Bloody drainage from the nose can be expected for several days, and patients are instructed to irrigate their nose frequently with salt water to help remove mucous and blood clots.  The drainage may be dark red or brown, though some fresh blood may be seen intermittently, especially after irrigation.  Do not blow you nose, as bleeding may occur. If you must sneeze, keep your mouth open to allow air to escape through your mouth.  If heavy bleeding occurs: Irrigate the nose with saline to rinse out clots, then spray the nose 3 - 4 times with Afrin Nasal Decongestant Spray.  The spray will constrict the blood vessels to slow bleeding.  Pinch the lower half of your nose shut to apply pressure, and lay down with your head elevated.  Ice packs over the nose may help as well. If bleeding persists despite these measures, you should notify your doctor.  Do not use the Afrin routinely to control nasal congestion after surgery, as it can result in worsening congestion and may affect healing.     Activity: Return to work varies among patients. Most patients will be out   of work at least 5 - 7 days to recover.  Patient may return to work after they are off of narcotic pain medication, and feeling well enough to perform the functions of their job.  Patients must avoid heavy lifting (over 10 pounds) or strenuous physical for 2 weeks after surgery, so your employer may need to assign you to light duty, or keep you out of work longer if light duty is not possible.  NOTE: you should not drive, operate dangerous machinery, do any mentally demanding tasks or make any important legal or financial decisions while on narcotic pain medication and recovering from the general anesthetic.    Call Your Doctor Immediately if You Have Any of the Following: Bleeding that you cannot control with the above measures Loss of vision, double vision, bulging of the eye or black eyes. Fever over 101 degrees Neck stiffness with severe headache,  fever, nausea and change in mental state. You are always encouraged to call anytime with concerns, however, please call with requests for pain medication refills during office hours.  Office Endoscopy: During follow-up visits your doctor will remove any packing or splints that may have been placed and evaluate and clean your sinuses endoscopically.  Topical anesthetic will be used to make this as comfortable as possible, though you may want to take your pain medication prior to the visit.  How often this will need to be done varies from patient to patient.  After complete recovery from the surgery, you may need follow-up endoscopy from time to time, particularly if there is concern of recurrent infection or nasal polyps.  

## 2022-01-04 DIAGNOSIS — M25561 Pain in right knee: Secondary | ICD-10-CM | POA: Diagnosis not present

## 2022-01-04 DIAGNOSIS — M25661 Stiffness of right knee, not elsewhere classified: Secondary | ICD-10-CM | POA: Diagnosis not present

## 2022-01-05 ENCOUNTER — Ambulatory Visit: Payer: Medicaid Other | Admitting: Anesthesiology

## 2022-01-05 ENCOUNTER — Encounter
Admission: RE | Disposition: A | Discharge status: Home / Self Care | Payer: Self-pay | Source: Home / Self Care | Attending: Otolaryngology

## 2022-01-05 ENCOUNTER — Ambulatory Visit (AMBULATORY_SURGERY_CENTER): Payer: Medicaid Other | Admitting: Anesthesiology

## 2022-01-05 ENCOUNTER — Encounter: Payer: Self-pay | Admitting: Otolaryngology

## 2022-01-05 ENCOUNTER — Ambulatory Visit
Admission: RE | Admit: 2022-01-05 | Discharge: 2022-01-05 | Disposition: A | Discharge status: Home / Self Care | Payer: Medicaid Other | Attending: Otolaryngology | Admitting: Otolaryngology

## 2022-01-05 ENCOUNTER — Other Ambulatory Visit: Payer: Self-pay

## 2022-01-05 DIAGNOSIS — K589 Irritable bowel syndrome without diarrhea: Secondary | ICD-10-CM | POA: Diagnosis not present

## 2022-01-05 DIAGNOSIS — J343 Hypertrophy of nasal turbinates: Secondary | ICD-10-CM

## 2022-01-05 DIAGNOSIS — F32A Depression, unspecified: Secondary | ICD-10-CM | POA: Insufficient documentation

## 2022-01-05 DIAGNOSIS — M797 Fibromyalgia: Secondary | ICD-10-CM | POA: Insufficient documentation

## 2022-01-05 DIAGNOSIS — Z87891 Personal history of nicotine dependence: Secondary | ICD-10-CM | POA: Diagnosis not present

## 2022-01-05 DIAGNOSIS — J3489 Other specified disorders of nose and nasal sinuses: Secondary | ICD-10-CM | POA: Diagnosis not present

## 2022-01-05 DIAGNOSIS — J342 Deviated nasal septum: Secondary | ICD-10-CM | POA: Diagnosis not present

## 2022-01-05 HISTORY — PX: NASAL ENDOSCOPY: SHX6577

## 2022-01-05 HISTORY — PX: SEPTOPLASTY: SHX2393

## 2022-01-05 HISTORY — PX: NASAL TURBINATE REDUCTION: SHX2072

## 2022-01-05 LAB — POCT PREGNANCY, URINE: Preg Test, Ur: NEGATIVE

## 2022-01-05 SURGERY — SEPTOPLASTY, NOSE
Anesthesia: General | Site: Nose

## 2022-01-05 MED ORDER — HYDROCODONE-ACETAMINOPHEN 5-325 MG PO TABS: 1.0000 | ORAL_TABLET | ORAL | 0 refills | Status: AC | PRN

## 2022-01-05 MED ORDER — SCOPOLAMINE 1 MG/3DAYS TD PT72
1.0000 | MEDICATED_PATCH | TRANSDERMAL | Status: DC
Start: 2022-01-05 — End: 2022-01-05
  Administered 2022-01-05: 1.5 mg via TRANSDERMAL

## 2022-01-05 MED ORDER — DEXAMETHASONE SODIUM PHOSPHATE 10 MG/ML IJ SOLN
INTRAMUSCULAR | Status: DC | PRN
  Administered 2022-01-05: 10 mg via INTRAVENOUS

## 2022-01-05 MED ORDER — ONDANSETRON HCL 4 MG/2ML IJ SOLN
INTRAMUSCULAR | Status: DC | PRN
  Administered 2022-01-05: 4 mg via INTRAVENOUS

## 2022-01-05 MED ORDER — ACETAMINOPHEN 500 MG PO TABS: 1000.0000 mg | ORAL_TABLET | Freq: Once | ORAL | Status: DC

## 2022-01-05 MED ORDER — HYDROMORPHONE HCL 1 MG/ML IJ SOLN: 0.2500 mg | INTRAMUSCULAR | Status: DC | PRN

## 2022-01-05 MED ORDER — OXYCODONE HCL 5 MG PO TABS: 5.0000 mg | ORAL_TABLET | Freq: Once | ORAL | Status: AC | PRN

## 2022-01-05 MED ORDER — LIDOCAINE HCL (CARDIAC) PF 100 MG/5ML IV SOSY
PREFILLED_SYRINGE | INTRAVENOUS | Status: DC | PRN
  Administered 2022-01-05: 100 mg via INTRAVENOUS

## 2022-01-05 MED ORDER — OXYMETAZOLINE HCL 0.05 % NA SOLN
2.0000 | Freq: Once | NASAL | Status: AC
  Administered 2022-01-05: 2 via NASAL

## 2022-01-05 MED ORDER — LACTATED RINGERS IV SOLN: INTRAVENOUS | Status: DC

## 2022-01-05 MED ORDER — PHENYLEPHRINE HCL 0.5 % NA SOLN
NASAL | Status: DC | PRN
  Administered 2022-01-05: 30 mL via TOPICAL

## 2022-01-05 MED ORDER — DEXMEDETOMIDINE (PRECEDEX) IN NS 20 MCG/5ML (4 MCG/ML) IV SYRINGE
PREFILLED_SYRINGE | INTRAVENOUS | Status: DC | PRN
  Administered 2022-01-05 (×2): 8 ug via INTRAVENOUS

## 2022-01-05 MED ORDER — FENTANYL CITRATE (PF) 100 MCG/2ML IJ SOLN
INTRAMUSCULAR | Status: DC | PRN
  Administered 2022-01-05: 100 ug via INTRAVENOUS

## 2022-01-05 MED ORDER — PREDNISONE 10 MG PO TABS: ORAL_TABLET | ORAL | 0 refills | Status: DC

## 2022-01-05 MED ORDER — OXYCODONE HCL 5 MG/5ML PO SOLN
5.0000 mg | Freq: Once | ORAL | Status: AC | PRN
  Administered 2022-01-05: 5 mg via ORAL

## 2022-01-05 MED ORDER — LIDOCAINE-EPINEPHRINE 1 %-1:100000 IJ SOLN
INTRAMUSCULAR | Status: DC | PRN
  Administered 2022-01-05: 6 mL
  Administered 2022-01-05: 2 mL

## 2022-01-05 MED ORDER — PROPOFOL 10 MG/ML IV BOLUS
INTRAVENOUS | Status: DC | PRN
  Administered 2022-01-05: 140 mg via INTRAVENOUS

## 2022-01-05 MED ORDER — MIDAZOLAM HCL 2 MG/2ML IJ SOLN
INTRAMUSCULAR | Status: DC | PRN
  Administered 2022-01-05: 2 mg via INTRAVENOUS

## 2022-01-05 MED ORDER — CEFAZOLIN SODIUM-DEXTROSE 2-4 GM/100ML-% IV SOLN
2.0000 g | Freq: Once | INTRAVENOUS | Status: AC
  Administered 2022-01-05 (×2): 2 g via INTRAVENOUS

## 2022-01-05 MED ORDER — CEPHALEXIN 500 MG PO CAPS: 500.0000 mg | ORAL_CAPSULE | Freq: Two times a day (BID) | ORAL | 0 refills | Status: DC

## 2022-01-05 MED ORDER — SUCCINYLCHOLINE CHLORIDE 200 MG/10ML IV SOSY
PREFILLED_SYRINGE | INTRAVENOUS | Status: DC | PRN
  Administered 2022-01-05: 100 mg via INTRAVENOUS

## 2022-01-05 MED ORDER — ACETAMINOPHEN 10 MG/ML IV SOLN
1000.0000 mg | Freq: Once | INTRAVENOUS | Status: AC
  Administered 2022-01-05: 1000 mg via INTRAVENOUS

## 2022-01-05 SURGICAL SUPPLY — 30 items
CANISTER SUCT 1200ML W/VALVE (MISCELLANEOUS) ×3 IMPLANT
COAGULATOR SUCT 8FR VV (MISCELLANEOUS) ×3 IMPLANT
ELECT REM PT RETURN 9FT ADLT (ELECTROSURGICAL) ×3
ELECTRODE REM PT RTRN 9FT ADLT (ELECTROSURGICAL) ×2 IMPLANT
GLOVE SURG GAMMEX PI TX LF 7.5 (GLOVE) ×6 IMPLANT
GOWN STRL REUS W/ TWL LRG LVL3 (GOWN DISPOSABLE) ×2 IMPLANT
GOWN STRL REUS W/TWL LRG LVL3 (GOWN DISPOSABLE) ×1
KIT TURNOVER KIT A (KITS) ×3 IMPLANT
NDL ANESTHESIA 27G X 3.5 (NEEDLE) ×2 IMPLANT
NDL HYPO 27GX1-1/4 (NEEDLE) ×2 IMPLANT
NEEDLE ANESTHESIA  27G X 3.5 (NEEDLE) ×1
NEEDLE ANESTHESIA 27G X 3.5 (NEEDLE) ×2 IMPLANT
NEEDLE HYPO 27GX1-1/4 (NEEDLE) ×3 IMPLANT
NS IRRIG 500ML POUR BTL (IV SOLUTION) ×3 IMPLANT
PACK ENT CUSTOM (PACKS) ×3 IMPLANT
PATTIES SURGICAL .5 X3 (DISPOSABLE) ×3 IMPLANT
SOL ANTI-FOG 6CC FOG-OUT (MISCELLANEOUS) ×2 IMPLANT
SOL FOG-OUT ANTI-FOG 6CC (MISCELLANEOUS) ×1
SPLINT NASAL SEPTAL BLV .50 ST (MISCELLANEOUS) ×3 IMPLANT
STRAP BODY AND KNEE 60X3 (MISCELLANEOUS) ×3 IMPLANT
STYLUS VIVAER (MISCELLANEOUS) ×1
STYLUS VIVAER BP ELECT (MISCELLANEOUS) IMPLANT
SUT CHROMIC 3-0 (SUTURE) ×2
SUT CHROMIC 3-0 KS 27XMFL CR (SUTURE) ×4
SUT ETHILON 3-0 KS 30 BLK (SUTURE) ×3 IMPLANT
SUT PLAIN GUT 4-0 (SUTURE) ×3 IMPLANT
SUTURE CHRMC 3-0 KS 27XMFL CR (SUTURE) IMPLANT
SYR 3ML LL SCALE MARK (SYRINGE) ×3 IMPLANT
TOWEL OR 17X26 4PK STRL BLUE (TOWEL DISPOSABLE) ×3 IMPLANT
WATER STERILE IRR 250ML POUR (IV SOLUTION) ×3 IMPLANT

## 2022-01-05 NOTE — Anesthesia Procedure Notes (Signed)
Procedure Name: Intubation Date/Time: 01/05/2022 10:18 AM  Performed by: Esaw Grandchild, CRNAPre-anesthesia Checklist: Patient identified, Emergency Drugs available, Suction available and Patient being monitored Patient Re-evaluated:Patient Re-evaluated prior to induction Oxygen Delivery Method: Circle system utilized Preoxygenation: Pre-oxygenation with 100% oxygen Induction Type: IV induction Ventilation: Mask ventilation without difficulty Laryngoscope Size: Miller and 2 Grade View: Grade I Tube type: Oral Rae Tube size: 7.0 mm Number of attempts: 1 Airway Equipment and Method: Stylet and Oral airway Placement Confirmation: ETT inserted through vocal cords under direct vision, positive ETCO2 and breath sounds checked- equal and bilateral Secured at: 20 cm Tube secured with: Tape Dental Injury: Teeth and Oropharynx as per pre-operative assessment

## 2022-01-05 NOTE — H&P (Signed)
H&P has been reviewed and patient reevaluated, no changes necessary. To be downloaded later.  

## 2022-01-05 NOTE — Transfer of Care (Signed)
Immediate Anesthesia Transfer of Care Note  Patient: Christine Arellano  Procedure(s) Performed: SEPTOPLASTY (Nose) INFERIOR TURBINATE REDUCTION (Bilateral: Nose) VIVAER NASAL VALVE REPAIR (Bilateral: Nose)  Patient Location: PACU  Anesthesia Type:General  Level of Consciousness: awake, alert  and oriented  Airway & Oxygen Therapy: Patient Spontanous Breathing and Patient connected to nasal cannula oxygen  Post-op Assessment: Report given to RN, Post -op Vital signs reviewed and stable and Patient moving all extremities  Post vital signs: Reviewed and stable  Last Vitals:  Vitals Value Taken Time  BP 116/65 01/05/22 1132  Temp    Pulse 57 01/05/22 1134  Resp 14 01/05/22 1134  SpO2 100 % 01/05/22 1134  Vitals shown include unvalidated device data.  Last Pain:  Vitals:   01/05/22 0847  TempSrc: Temporal  PainSc: 0-No pain         Complications: No notable events documented.

## 2022-01-05 NOTE — Anesthesia Postprocedure Evaluation (Signed)
Anesthesia Post Note  Patient: Christine Arellano  Procedure(s) Performed: SEPTOPLASTY (Nose) INFERIOR TURBINATE REDUCTION (Bilateral: Nose) VIVAER NASAL VALVE REPAIR (Bilateral: Nose)     Patient location during evaluation: PACU Anesthesia Type: General Level of consciousness: awake and alert Pain management: pain level controlled Vital Signs Assessment: post-procedure vital signs reviewed and stable Respiratory status: spontaneous breathing, nonlabored ventilation, respiratory function stable and patient connected to nasal cannula oxygen Cardiovascular status: blood pressure returned to baseline and stable Postop Assessment: no apparent nausea or vomiting Anesthetic complications: no   No notable events documented.  Dartanian Knaggs

## 2022-01-05 NOTE — Op Note (Signed)
01/05/2022  11:46 AM  [id number]   Pre-Op Dx:  Deviated Nasal Septum, Hypertrophic Inferior Turbinates, nasal valve collapse  Post-op Dx: Same  Proc: Nasal Septoplasty, Bilateral Partial Reduction Inferior Turbinates, nasal valve repair using Vivaer  Surg:  Elon Alas Patina Spanier  Anes:  GOT  EBL: 50 mL  Comp: None  Findings: Septum buckled to the right superiorly and anteriorly and then a very large vomer spur off to the left side posteriorly.  Some of the ethmoid plate was buckled to the left as well.  The turbinates were very enlarged especially the right side.  The right nasal valve was much smaller than the left as there was asymmetry of the nostril openings.  There was collapse of the right nasal valve with inspiration as well.  Of  Procedure: With the patient in a comfortable supine position,  general orotracheal anesthesia was induced without difficulty.     The patient received preoperative Afrin spray for topical decongestion and vasoconstriction.  Intravenous prophylactic antibiotics were administered.  At an appropriate level, the patient was placed in a semi-sitting position.  Nasal vibrissae were trimmed.   1% Xylocaine with 1:100,000 epinephrine, 6 cc's, was infiltrated into the anterior floor of the nose, into the nasal spine region, into the membranous columella, and finally into the submucoperichondrial plane of the septum on both sides.  Several minutes were allowed for this to take effect.  Cottoniod pledgetts soaked in Afrin and 4% Xylocaine were placed into both nasal cavities and left while the patient was prepped and draped in the standard fashion.  The materials were removed from the nose and observed to be intact and correct in number.  The nose was inspected with a headlight and zero degree scope with the findings as described above.  A left Killian incision was sharply executed and carried down to the quadrangular cartilage. The mucoperichondrium was elelvated along  the quadrangular plate back to the bony-cartilaginous junction. The mucoperiostium was then elevated along the ethmoid plate and the vomer. The boney-catilaginous junction was then split with a freer elevator and the mucoperiosteum was elevated on the opposite side. The mucoperiosteum was then elevated along the maxillary crest as needed to expose the crooked bone of the crest.  Boney spurs of the vomer and maxillary crest were removed with Donavan Foil forceps.  The cartilaginous plate was trimmed along its posterior and inferior borders of about 2 mm of cartilage to free it up inferiorly. Some of the deviated ethmoid plate was then fractured and removed with Takahashi forceps to free up the posterior border of the quadrangular plate and allow it to swing back to the midline. The mucosal flaps were placed back into their anatomic position to allow visualization of the airways. The septum now sat in the midline with an improved airway.  A 3-0 Chromic suture on a Keith needle in used to anchor the inferior septum at the nasal spine with a through and through suture. The mucosal flaps are then sutured together using a through and through whip stitch of 4-0 Plain Gut with a mini-Keith needle. This was used to close the Basalt incision as well.   The inferior turbinates were then inspected. An incision was created along the inferior aspect of the left inferior turbinate with removal of some of the inferior soft tissue and bone. Electrocautery was used to control bleeding in the area. The remaining turbinate was then outfractured to open up the airway further. There was no significant bleeding noted. The right turbinate  was then trimmed and outfractured in a similar fashion.  Approximately 1 mL of 1% Xylocaine with epi 1: 100,000 was used for infiltration into the right and left lateral nasal valve wall.  This is at the junction of the upper and lower lateral cartilages.  The Vivaer machine was brought to the field  along with a sterile hand wand.  The wand used gel to lubricate it.  The the wand was placed against the lateral nasal valve and superior area on the right side.  The power was applied for an 18-second time.  With 12 seconds of cool down.  This was done in 4 different times along the right lateral nasal valve from superior to inferior using 30 second cycles each time.  This helped to lateralize the nasal valve and open up the nasal airway and should stiffen this as well.  This was repeated on the left side with only using two 30-second cycles superiorly and laterally.  The airway is now more symmetric with the right side more open.  The airways were then visualized and showed open passageways on both sides that were significantly improved compared to before surgery. There was no signifcant bleeding. Nasal splints were applied to both sides of the septum using Xomed 0.1m regular sized splints that were trimmed, and then held in position with a 3-0 Nylon through and through suture.  The patient was turned back over to anesthesia, and awakened, extubated, and taken to the PACU in satisfactory condition.  Dispo:   PACU to home  Plan: Ice, elevation, narcotic analgesia, steroid taper, and prophylactic antibiotics for the duration of indwelling nasal foreign bodies.  She will use Vaseline on the lateral nasal wall to help lubricate the areas that we remodeled.  We will reevaluate the patient in the office in 6 days and remove the septal splints.  Return to work in 10 days, strenuous activities in two weeks.   PElon AlasJuengel 01/05/2022 11:46 AM

## 2022-01-05 NOTE — Anesthesia Preprocedure Evaluation (Addendum)
Anesthesia Evaluation  Patient identified by MRN, date of birth, ID band Patient awake    Reviewed: NPO status   Airway Mallampati: II  TM Distance: >3 FB Neck ROM: Full    Dental no notable dental hx. (+) Teeth Intact, Dental Advisory Given   Pulmonary former smoker,    Pulmonary exam normal breath sounds clear to auscultation       Cardiovascular Normal cardiovascular exam Rhythm:Regular Rate:Normal     Neuro/Psych  Headaches, PSYCHIATRIC DISORDERS Anxiety Depression  Neuromuscular disease (fibromyalgia )    GI/Hepatic GERD  ,IBS   Endo/Other    Renal/GU      Musculoskeletal  (+) Arthritis , Fibromyalgia -  Abdominal   Peds  Hematology   Anesthesia Other Findings   Reproductive/Obstetrics                            Anesthesia Physical Anesthesia Plan  ASA: 2  Anesthesia Plan: General   Post-op Pain Management: Dilaudid IV   Induction: Intravenous  PONV Risk Score and Plan: 4 or greater and Ondansetron, Dexamethasone, Diphenhydramine, Scopolamine patch - Pre-op and Midazolam  Airway Management Planned: Oral ETT  Additional Equipment:   Intra-op Plan:   Post-operative Plan: Extubation in OR  Informed Consent: I have reviewed the patients History and Physical, chart, labs and discussed the procedure including the risks, benefits and alternatives for the proposed anesthesia with the patient or authorized representative who has indicated his/her understanding and acceptance.     Dental advisory given  Plan Discussed with:   Anesthesia Plan Comments:         Anesthesia Quick Evaluation

## 2022-01-06 ENCOUNTER — Encounter: Payer: Self-pay | Admitting: Otolaryngology

## 2022-01-11 DIAGNOSIS — J3489 Other specified disorders of nose and nasal sinuses: Secondary | ICD-10-CM | POA: Diagnosis not present

## 2022-01-13 ENCOUNTER — Ambulatory Visit: Payer: Medicaid Other | Admitting: Internal Medicine

## 2022-01-16 ENCOUNTER — Other Ambulatory Visit: Payer: Self-pay | Admitting: Family Medicine

## 2022-01-16 DIAGNOSIS — G8929 Other chronic pain: Secondary | ICD-10-CM

## 2022-01-16 DIAGNOSIS — M4727 Other spondylosis with radiculopathy, lumbosacral region: Secondary | ICD-10-CM

## 2022-01-17 NOTE — Telephone Encounter (Signed)
Requested Prescriptions  Pending Prescriptions Disp Refills  . celecoxib (CELEBREX) 200 MG capsule [Pharmacy Med Name: CELECOXIB 200 MG CAPSULE] 60 capsule 2    Sig: ONE TO 2 CAPS BY MOUTH DAILY AS NEEDED FOR PAIN.     Analgesics:  COX2 Inhibitors Failed - 01/16/2022  2:06 PM      Failed - Manual Review: Labs are only required if the patient has taken medication for more than 8 weeks.      Passed - HGB in normal range and within 360 days    Hemoglobin  Date Value Ref Range Status  10/31/2021 12.9 11.1 - 15.9 g/dL Final  07/21/2014 13.8 g/dL Final         Passed - Cr in normal range and within 360 days    Creatinine, Ser  Date Value Ref Range Status  10/31/2021 0.76 0.57 - 1.00 mg/dL Final         Passed - HCT in normal range and within 360 days    HCT  Date Value Ref Range Status  07/21/2014 41 % Final   Hematocrit  Date Value Ref Range Status  10/31/2021 37.3 34.0 - 46.6 % Final         Passed - AST in normal range and within 360 days    AST  Date Value Ref Range Status  10/31/2021 18 0 - 40 IU/L Final         Passed - ALT in normal range and within 360 days    ALT  Date Value Ref Range Status  10/31/2021 17 0 - 32 IU/L Final         Passed - eGFR is 30 or above and within 360 days    GFR calc Af Amer  Date Value Ref Range Status  05/31/2018 154 >59 mL/min/1.73 Final   GFR calc non Af Amer  Date Value Ref Range Status  05/31/2018 134 >59 mL/min/1.73 Final   eGFR  Date Value Ref Range Status  10/31/2021 106 >59 mL/min/1.73 Final         Passed - Patient is not pregnant      Passed - Valid encounter within last 12 months    Recent Outpatient Visits          2 months ago B12 nutritional deficiency   [facility] Berglund, Laura H, MD   4 months ago Chronic sacroiliac joint pain   [facility] Matthews, Jason J, MD   4 months ago B12 nutritional  deficiency   [facility] Berglund, Laura H, MD   5 months ago Lumbosacral spondylosis with radiculopathy   [facility] Matthews, Jason J, MD   6 months ago Annual physical exam   [facility] Berglund, Laura H, MD      Future Appointments            In 5 months Berglund, Laura H, MD [facility]            

## 2022-01-18 ENCOUNTER — Other Ambulatory Visit: Payer: Self-pay | Admitting: Family Medicine

## 2022-01-19 DIAGNOSIS — J3489 Other specified disorders of nose and nasal sinuses: Secondary | ICD-10-CM | POA: Diagnosis not present

## 2022-01-19 NOTE — Telephone Encounter (Signed)
Requested medication (s) are due for refill today -yes  Requested medication (s) are on the active medication list -yes  Future visit scheduled -yes  Last refill: 12/26/21 #30  Notes to clinic: non delegated Rx  Requested Prescriptions  Pending Prescriptions Disp Refills   tiZANidine (ZANAFLEX) 4 MG tablet [Pharmacy Med Name: TIZANIDINE HCL 4 MG TABLET] 30 tablet 0    Sig: TAKE 1 TABLET BY MOUTH EVERYDAY AT BEDTIME     Not Delegated - Cardiovascular:  Alpha-2 Agonists - tizanidine Failed - 01/18/2022  3:32 PM      Failed - This refill cannot be delegated      Passed - Valid encounter within last 6 months    Recent Outpatient Visits           2 months ago B12 nutritional deficiency   [place] [facility] at [facility], Jesse Sans, MD   4 months ago Chronic sacroiliac joint pain   [place] [facility] at [place], Earley Abide, MD   4 months ago B12 nutritional deficiency   [facility] at [facility], Jesse Sans, MD   5 months ago Lumbosacral spondylosis with radiculopathy   [place] [facility] at [place], Earley Abide, MD   6 months ago Annual physical exam   [facility] at [facility], Jesse Sans, MD       Future Appointments             In 5 months Army Melia Jesse Sans, MD [facility] Primary Care and Sports Medicine at [facility], [facility]               Requested Prescriptions  Pending Prescriptions Disp Refills   tiZANidine (ZANAFLEX) 4 MG tablet [Pharmacy Med Name: TIZANIDINE HCL 4 MG TABLET] 30 tablet 0    Sig: TAKE 1 TABLET BY MOUTH EVERYDAY AT BEDTIME     Not Delegated - Cardiovascular:  Alpha-2 Agonists - tizanidine Failed - 01/18/2022  3:32 PM      Failed - This refill cannot be delegated      Passed - Valid encounter within last 6 months    Recent Outpatient  Visits           2 months ago B12 nutritional deficiency   [place] [facility] at [facility], Jesse Sans, MD   4 months ago Chronic sacroiliac joint pain   [place] [facility] at [place], Earley Abide, MD   4 months ago B12 nutritional deficiency   [facility] at [facility], Jesse Sans, MD   5 months ago Lumbosacral spondylosis with radiculopathy    Primary Care and Sports Medicine at [place], Earley Abide, MD   6 months ago Annual physical exam   [facility] and [facility] at [facility], Jesse Sans, MD       Future Appointments             In 5 months Army Melia, Jesse Sans, MD [facility] Primary Care and Sports Medicine at [facility], [facility]

## 2022-01-23 DIAGNOSIS — L700 Acne vulgaris: Secondary | ICD-10-CM | POA: Diagnosis not present

## 2022-01-23 DIAGNOSIS — Z79899 Other long term (current) drug therapy: Secondary | ICD-10-CM | POA: Diagnosis not present

## 2022-01-31 DIAGNOSIS — J3489 Other specified disorders of nose and nasal sinuses: Secondary | ICD-10-CM | POA: Diagnosis not present

## 2022-02-08 ENCOUNTER — Other Ambulatory Visit: Payer: Self-pay

## 2022-02-08 ENCOUNTER — Telehealth: Payer: Self-pay | Admitting: Internal Medicine

## 2022-02-08 DIAGNOSIS — G8929 Other chronic pain: Secondary | ICD-10-CM

## 2022-02-08 DIAGNOSIS — M4727 Other spondylosis with radiculopathy, lumbosacral region: Secondary | ICD-10-CM

## 2022-02-08 NOTE — Telephone Encounter (Signed)
Copied from [place] [phone]. Topic: Referral - Request for Referral >> Feb 08, 2022 11:04 AM Everette C wrote: Has patient seen PCP for this complaint? Yes.   *If NO, is insurance requiring patient see PCP for this issue before PCP can refer them? Referral for which specialty: Pain Management  Preferred provider/office: [facility]  Reason for referral: patient's previous referral expired

## 2022-02-08 NOTE — Telephone Encounter (Signed)
Called pt let her know that a referral was placed. Pt verbalized understanding.  KP

## 2022-02-10 DIAGNOSIS — M797 Fibromyalgia: Secondary | ICD-10-CM | POA: Diagnosis not present

## 2022-02-10 DIAGNOSIS — M48062 Spinal stenosis, lumbar region with neurogenic claudication: Secondary | ICD-10-CM | POA: Diagnosis not present

## 2022-02-18 ENCOUNTER — Other Ambulatory Visit: Payer: Self-pay | Admitting: Internal Medicine

## 2022-02-20 ENCOUNTER — Other Ambulatory Visit: Payer: Self-pay

## 2022-02-20 NOTE — Telephone Encounter (Signed)
Requested medication (s) are due for refill today: yes  Requested medication (s) are on the active medication list: yes  Last refill:  01/19/22 #30 0 refills  Future visit scheduled: yes in 4 months   Notes to clinic:  not delegated per protocol. Do you want to refill Rx?     Requested Prescriptions  Pending Prescriptions Disp Refills   tiZANidine (ZANAFLEX) 4 MG tablet [Pharmacy Med Name: TIZANIDINE HCL 4 MG TABLET] 30 tablet 0    Sig: TAKE 1 TABLET BY MOUTH EVERYDAY AT BEDTIME     Not Delegated - Cardiovascular:  Alpha-2 Agonists - tizanidine Failed - 02/18/2022  2:33 PM      Failed - This refill cannot be delegated      Passed - Valid encounter within last 6 months    Recent Outpatient Visits           3 months ago B12 nutritional deficiency   [place] [facility] at [facility], Jesse Sans, MD   5 months ago Chronic sacroiliac joint pain   [place] [facility] and [facility] Medicine at [place], Earley Abide, MD   5 months ago B12 nutritional deficiency   [facility] at [facility], Jesse Sans, MD   6 months ago Lumbosacral spondylosis with radiculopathy    Primary Care and Sports Medicine at [place], Earley Abide, MD   7 months ago Annual physical exam   [facility] at [facility], Jesse Sans, MD       Future Appointments             In 4 months Army Melia, Jesse Sans, MD [facility] Primary Care and Sports Medicine at [facility], [facility]

## 2022-02-22 ENCOUNTER — Other Ambulatory Visit: Payer: Self-pay | Admitting: Internal Medicine

## 2022-02-22 NOTE — Telephone Encounter (Signed)
Requested medication (s) are due for refill today: yes  Requested medication (s) are on the active medication list: yes  Last refill:  02/20/22 #30 0 refills  Future visit scheduled: yes in 4 months   Notes to clinic:  not delegated per protocol. Signed 02/20/22. Called pharmacy to verify if refill completed. Pharmacy reports power outage 02/20/22 . Requesting to resend Rx/     Requested Prescriptions  Pending Prescriptions Disp Refills   tiZANidine (ZANAFLEX) 4 MG tablet [Pharmacy Med Name: TIZANIDINE HCL 4 MG TABLET] 30 tablet 0    Sig: TAKE 1 TABLET BY MOUTH EVERYDAY AT BEDTIME     Not Delegated - Cardiovascular:  Alpha-2 Agonists - tizanidine Failed - 02/22/2022 11:03 AM      Failed - This refill cannot be delegated      Passed - Valid encounter within last 6 months    Recent Outpatient Visits           3 months ago B12 nutritional deficiency   [place] [facility] at [facility], Jesse Sans, MD   5 months ago Chronic sacroiliac joint pain   [place] Primary [facility] and Sports Medicine at [place], Earley Abide, MD   5 months ago B12 nutritional deficiency   [facility] and [facility] Medicine at [facility], Jesse Sans, MD   6 months ago Lumbosacral spondylosis with radiculopathy   [place] [facility] and Sports Medicine at [place], Earley Abide, MD   7 months ago Annual physical exam   [facility] at [facility], Jesse Sans, MD       Future Appointments             In 4 months Army Melia, Jesse Sans, MD [place] [facility] and [facility] Medicine at [facility], [facility]

## 2022-02-22 NOTE — Telephone Encounter (Signed)
Called [facility] pharmacy to confirm patient did not pick up medication. Pharmacy staff reports power out on 02/20/22 and requesting to resend Rx to refill.

## 2022-02-24 DIAGNOSIS — L700 Acne vulgaris: Secondary | ICD-10-CM | POA: Diagnosis not present

## 2022-02-24 DIAGNOSIS — Z79899 Other long term (current) drug therapy: Secondary | ICD-10-CM | POA: Diagnosis not present

## 2022-02-27 ENCOUNTER — Other Ambulatory Visit: Payer: Self-pay | Admitting: Internal Medicine

## 2022-02-27 DIAGNOSIS — B354 Tinea corporis: Secondary | ICD-10-CM

## 2022-02-27 DIAGNOSIS — E538 Deficiency of other specified B group vitamins: Secondary | ICD-10-CM

## 2022-02-28 NOTE — Telephone Encounter (Signed)
Requested medication (s) are due for refill today - no  Requested medication (s) are on the active medication list -yes  Future visit scheduled -no  Last refill: 02/22/22 #30  Notes to clinic: requesting 90 day supply- non delegated Rx- requires changes to original Rx  Requested Prescriptions  Pending Prescriptions Disp Refills   tiZANidine (ZANAFLEX) 4 MG tablet [Pharmacy Med Name: TIZANIDINE HCL 4 MG TABLET] 90 tablet 1    Sig: TAKE 1 TABLET BY MOUTH EVERYDAY AT BEDTIME     Not Delegated - Cardiovascular:  Alpha-2 Agonists - tizanidine Failed - 02/27/2022  2:02 PM      Failed - This refill cannot be delegated      Passed - Valid encounter within last 6 months    Recent Outpatient Visits           4 months ago B12 nutritional deficiency   [place] [facility] at [facility], Jesse Sans, MD   5 months ago Chronic sacroiliac joint pain   [place] [facility] and Sports Medicine at [place], Earley Abide, MD   6 months ago B12 nutritional deficiency   [facility] at [facility], Jesse Sans, MD   6 months ago Lumbosacral spondylosis with radiculopathy   [place] [facility] and Sports Medicine at [place], Earley Abide, MD   8 months ago Annual physical exam   [facility] Primary Care and [facility] Medicine at [facility], Jesse Sans, MD       Future Appointments             In 4 months Army Melia Jesse Sans, MD [facility] Primary Care and Sports Medicine at [facility], [facility]               Requested Prescriptions  Pending Prescriptions Disp Refills   tiZANidine (ZANAFLEX) 4 MG tablet [Pharmacy Med Name: TIZANIDINE HCL 4 MG TABLET] 90 tablet 1    Sig: TAKE 1 TABLET BY MOUTH EVERYDAY AT BEDTIME     Not Delegated - Cardiovascular:  Alpha-2 Agonists - tizanidine Failed - 02/27/2022  2:02 PM      Failed - This refill cannot be delegated      Passed -  Valid encounter within last 6 months    Recent Outpatient Visits           4 months ago B12 nutritional deficiency   [place] [facility] Medicine at [facility], Jesse Sans, MD   5 months ago Chronic sacroiliac joint pain   [place] Primary Care and Sports Medicine at [place], Earley Abide, MD   6 months ago B12 nutritional deficiency   [facility] and [facility] Medicine at [facility], Jesse Sans, MD   6 months ago Lumbosacral spondylosis with radiculopathy   [place] Primary Care and Sports Medicine at [place], Earley Abide, MD   8 months ago Annual physical exam   [facility] Primary Care and Sports Medicine at [facility], Jesse Sans, MD       Future Appointments             In 4 months Army Melia, Jesse Sans, MD [facility] Primary Care and Sports Medicine at [facility], [facility]

## 2022-02-28 NOTE — Telephone Encounter (Signed)
Requested medication (s) are due for refill today:   Yes for both  Requested medication (s) are on the active medication list:   Yes for both  Future visit scheduled:   Yes    Last ordered: B12 tablets 08/30/2021 #90, 0 refills;   Nystatin Ointment 06/29/2021 30 g, 0 refills  Returned because B12 level very elevated;   Nystatin - No protocol assigned to it.      Requested Prescriptions  Pending Prescriptions Disp Refills   cyanocobalamin (VITAMIN B12) 1000 MCG tablet [Pharmacy Med Name: VITAMIN B-12 1,000 MCG TABLET] 90 tablet 0    Sig: TAKE 1 TABLET BY MOUTH EVERY DAY     Endocrinology:  Vitamins - Vitamin B12 Failed - 02/27/2022  1:54 PM      Failed - B12 Level in normal range and within 360 days    Vitamin B-12  Date Value Ref Range Status  10/31/2021 >2000 (H) 232 - 1245 pg/mL Final         Passed - HCT in normal range and within 360 days    HCT  Date Value Ref Range Status  07/21/2014 41 % Final   Hematocrit  Date Value Ref Range Status  10/31/2021 37.3 34.0 - 46.6 % Final         Passed - HGB in normal range and within 360 days    Hemoglobin  Date Value Ref Range Status  10/31/2021 12.9 11.1 - 15.9 g/dL Final  07/21/2014 13.8 g/dL Final         Passed - Valid encounter within last 12 months    Recent Outpatient Visits           4 months ago B12 nutritional deficiency   [place] [facility] at [facility], Jesse Sans, MD   5 months ago Chronic sacroiliac joint pain   [place] [facility] and Sports Medicine at [place], Earley Abide, MD   6 months ago B12 nutritional deficiency   [facility] and Sports Medicine at [facility], Jesse Sans, MD   6 months ago Lumbosacral spondylosis with radiculopathy   [place] [facility] Care and Sports Medicine at [place], Earley Abide, MD   8 months ago Annual physical exam   [facility] Primary Care and Sports Medicine at [facility], Jesse Sans, MD       Future Appointments             In 4 months Army Melia Jesse Sans, MD [facility] Primary Care and Sports Medicine at [facility], [facility]             nystatin ointment (MYCOSTATIN) [Pharmacy Med Name: NYSTATIN 100,000 UNIT/GM OINT] 30 g 0    Sig: APPLY TO AFFECTED AREA TWICE A DAY     Off-Protocol Failed - 02/27/2022  1:54 PM      Failed - Medication not assigned to a protocol, review manually.      Passed - Valid encounter within last 12 months    Recent Outpatient Visits           4 months ago B12 nutritional deficiency   [place] [facility] at [facility], Jesse Sans, MD   5 months ago Chronic sacroiliac joint pain    Primary Care and Sports Medicine at [place], Earley Abide, MD   6 months ago B12 nutritional deficiency   [facility] Primary Care and  Sports Medicine at [facility], Jesse Sans, MD   6 months ago Lumbosacral spondylosis with radiculopathy   [place] [facility] at [facility], Earley Abide, MD   8 months ago Annual physical exam   [facility] and Sports Medicine at [facility], Jesse Sans, MD       Future Appointments             In 4 months Army Melia, Jesse Sans, MD [place] Primary Care and Sports Medicine at [facility], [facility]

## 2022-03-27 ENCOUNTER — Ambulatory Visit
Admission: RE | Admit: 2022-03-27 | Discharge: 2022-03-27 | Disposition: A | Discharge status: Home / Self Care | Payer: Medicaid Other | Source: Ambulatory Visit | Attending: Pain Medicine | Admitting: Pain Medicine

## 2022-03-27 ENCOUNTER — Ambulatory Visit: Payer: Medicaid Other | Admitting: Pain Medicine

## 2022-03-27 ENCOUNTER — Encounter: Payer: Self-pay | Admitting: Pain Medicine

## 2022-03-27 ENCOUNTER — Other Ambulatory Visit
Admission: RE | Admit: 2022-03-27 | Discharge: 2022-03-27 | Disposition: A | Discharge status: Home / Self Care | Payer: Medicaid Other | Source: Ambulatory Visit | Attending: Pain Medicine | Admitting: Pain Medicine

## 2022-03-27 VITALS — BP 118/79 | HR 78 | Temp 97.3°F | Ht 62.0 in | Wt 180.0 lb

## 2022-03-27 DIAGNOSIS — M545 Low back pain, unspecified: Secondary | ICD-10-CM | POA: Diagnosis not present

## 2022-03-27 DIAGNOSIS — M25561 Pain in right knee: Secondary | ICD-10-CM | POA: Insufficient documentation

## 2022-03-27 DIAGNOSIS — M549 Dorsalgia, unspecified: Secondary | ICD-10-CM | POA: Insufficient documentation

## 2022-03-27 DIAGNOSIS — M40204 Unspecified kyphosis, thoracic region: Secondary | ICD-10-CM | POA: Diagnosis not present

## 2022-03-27 DIAGNOSIS — M533 Sacrococcygeal disorders, not elsewhere classified: Secondary | ICD-10-CM

## 2022-03-27 DIAGNOSIS — M1711 Unilateral primary osteoarthritis, right knee: Secondary | ICD-10-CM | POA: Diagnosis not present

## 2022-03-27 DIAGNOSIS — M25552 Pain in left hip: Secondary | ICD-10-CM | POA: Insufficient documentation

## 2022-03-27 DIAGNOSIS — M25511 Pain in right shoulder: Secondary | ICD-10-CM | POA: Insufficient documentation

## 2022-03-27 DIAGNOSIS — M899 Disorder of bone, unspecified: Secondary | ICD-10-CM | POA: Insufficient documentation

## 2022-03-27 DIAGNOSIS — M25562 Pain in left knee: Secondary | ICD-10-CM

## 2022-03-27 DIAGNOSIS — M25551 Pain in right hip: Secondary | ICD-10-CM

## 2022-03-27 DIAGNOSIS — G8929 Other chronic pain: Secondary | ICD-10-CM | POA: Insufficient documentation

## 2022-03-27 DIAGNOSIS — Z79899 Other long term (current) drug therapy: Secondary | ICD-10-CM | POA: Insufficient documentation

## 2022-03-27 DIAGNOSIS — M79605 Pain in left leg: Secondary | ICD-10-CM | POA: Insufficient documentation

## 2022-03-27 DIAGNOSIS — M797 Fibromyalgia: Secondary | ICD-10-CM | POA: Insufficient documentation

## 2022-03-27 DIAGNOSIS — G894 Chronic pain syndrome: Secondary | ICD-10-CM | POA: Insufficient documentation

## 2022-03-27 DIAGNOSIS — M25512 Pain in left shoulder: Secondary | ICD-10-CM

## 2022-03-27 DIAGNOSIS — M79604 Pain in right leg: Secondary | ICD-10-CM

## 2022-03-27 DIAGNOSIS — Z789 Other specified health status: Secondary | ICD-10-CM | POA: Insufficient documentation

## 2022-03-27 DIAGNOSIS — M898X1 Other specified disorders of bone, shoulder: Secondary | ICD-10-CM | POA: Insufficient documentation

## 2022-03-27 LAB — COMPREHENSIVE METABOLIC PANEL
ALT: 11 U/L (ref 0–44)
AST: 17 U/L (ref 15–41)
Albumin: 3.9 g/dL (ref 3.5–5.0)
Alkaline Phosphatase: 48 U/L (ref 38–126)
Anion gap: 7 (ref 5–15)
BUN: 14 mg/dL (ref 6–20)
CO2: 23 mmol/L (ref 22–32)
Calcium: 9 mg/dL (ref 8.9–10.3)
Chloride: 103 mmol/L (ref 98–111)
Creatinine, Ser: 0.58 mg/dL (ref 0.44–1.00)
GFR, Estimated: >60 mL/min (ref >60)
Glucose, Bld: 82 mg/dL (ref 70–99)
Potassium: 4 mmol/L (ref 3.5–5.1)
Sodium: 133 mmol/L — ABNORMAL LOW (ref 135–145)
Total Bilirubin: 0.4 mg/dL (ref 0.3–1.2)
Total Protein: 7.4 g/dL (ref 6.5–8.1)

## 2022-03-27 LAB — VITAMIN D 25 HYDROXY (VIT D DEFICIENCY, FRACTURES): Vit D, 25-Hydroxy: 61.38 ng/mL (ref 30–100)

## 2022-03-27 LAB — SEDIMENTATION RATE: Sed Rate: 4 mm/hr (ref 0–20)

## 2022-03-27 LAB — C-REACTIVE PROTEIN: CRP: 0.6 mg/dL (ref <1.0)

## 2022-03-27 LAB — MAGNESIUM: Magnesium: 1.7 mg/dL (ref 1.7–2.4)

## 2022-03-27 LAB — VITAMIN B12: Vitamin B-12: 537 pg/mL (ref 180–914)

## 2022-03-27 NOTE — Patient Instructions (Signed)
____________________________________________________________________________________________  New Patients  Welcome to [facility] [place] [facility].   Initial Visit The first or initial visit consists of an evaluation only.   Interventional pain management.  We offer therapies other than opioid controlled substances to manage chronic pain. These include, but are not limited to, diagnostic, therapeutic, and palliative specialized injection therapies (i.e.: Epidural Steroids, Facet Blocks, etc.). We specialize in a variety of nerve blocks as well as radiofrequency treatments. We offer pain implant evaluations and trials, as well as follow up management. In addition we also provide a variety joint injections, including Viscosupplementation (AKA: Gel Therapy).  Prescription Pain Medication We provide evaluations for/of pharmacologic therapies. Recommendations will follow CDC Guidelines.  We no longer take patients for long-term medication management. We will not be taking over your pain medications.  ____________________________________________________________________________________________    ____________________________________________________________________________________________  Patient Information update  To: All of our patients.  Re: Name change.  It has been made official that our current name, "[facility]"   will soon be changed to "[facility] [place] [facility]".   The purpose of this change is to eliminate any confusion created by the concept of our practice being a "Medication Management Pain Clinic". In the past this has led to the misconception that we treat pain primarily by the use of prescription medications.  Nothing can be farther from the truth.   Understanding PAIN MANAGEMENT: To further understand what our practice does, you  first have to understand that "Pain Management" is a subspecialty that requires additional training once a physician has completed their specialty training, which can be in either Anesthesia, Neurology, Psychiatry, or Physical Medicine and Rehabilitation (PMR). Each one of these contributes to the final approach taken by each physician to the management of their patient's pain. To be a "Pain Management Specialist" you must have first completed one of the specialty trainings below.  Anesthesiologists - trained in clinical pharmacology and interventional techniques such as nerve blockade and regional as well as central neuroanatomy. They are trained to block pain before, during, and after surgical interventions.  Neurologists - trained in the diagnosis and pharmacological treatment of complex neurological conditions, such as Multiple Sclerosis, Parkinson's, spinal cord injuries, and other systemic conditions that may be associated with symptoms that may include but are not limited to pain. They tend to rely primarily on the treatment of chronic pain using prescription medications.  Psychiatrist - trained in conditions affecting the psychosocial wellbeing of patients including but not limited to depression, anxiety, schizophrenia, personality disorders, addiction, and other substance use disorders that may be associated with chronic pain. They tend to rely primarily on the treatment of chronic pain using prescription medications.   Physical Medicine and Rehabilitation (PMR) physicians, also known as physiatrists - trained to treat a wide variety of medical conditions affecting the brain, spinal cord, nerves, bones, joints, ligaments, muscles, and tendons. Their training is primarily aimed at treating patients that have suffered injuries that have caused severe physical impairment. Their training is primarily aimed at the physical therapy and rehabilitation of those patients. They may also work alongside  orthopedic surgeons or neurosurgeons using their expertise in assisting surgical patients to recover after their surgeries.  INTERVENTIONAL PAIN MANAGEMENT is sub-subspecialty of Pain Management.  Our physicians are Board-certified in Anesthesia, Pain Management, and Interventional Pain Management.  This meaning that not only have they been trained and Board-certified in their specialty of Anesthesia, and   subspecialty of Pain Management, but they have also received further training in the sub-subspecialty of Interventional Pain Management, in order to become Board-certified as INTERVENTIONAL PAIN MANAGEMENT SPECIALIST.    Mission: Our goal is to use our skills in  INTERVENTIONAL PAIN MANAGEMENT as alternatives to the chronic use of prescription opioid medications for the treatment of pain. To make this more clear, we have changed our name to reflect what we do and offer. We will continue to offer medication management assessment and recommendations, but we will not be taking over any patient's medication management.  ____________________________________________________________________________________________     

## 2022-03-27 NOTE — Progress Notes (Signed)
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 (Bilateral) (R>L)   4. Chronic knee pain (3ry area of Pain) (Bilateral) (R>L)   5. Chronic upper back pain (4th area of Pain) (Midline) (Bilateral)   6. Chronic shoulder-blade pain (Bilateral)   7. Chronic shoulder pain (5th area of Pain) (Bilateral) (R>L)   8. Chronic hip pain (6th area of Pain) (Bilateral) (R>L)   9. Chronic sacroiliac joint pain (Bilateral)   10. Fibromyalgia syndrome (7th area of Pain)   11. Pharmacologic therapy   12. Disorder of skeletal system   13. Problems influencing  health status    Plan of Care (Initial workup plan)  Note: Ms. Farner was reminded that as per protocol, today's visit has been an evaluation only. We have not taken over the patient's controlled substance management.  Problem-specific plan: No problem-specific Assessment & Plan notes found for this encounter.  Lab Orders         Compliance Drug Analysis, Ur          Comp. Metabolic Panel (12)         Magnesium         Vitamin B12         Sedimentation rate         25-Hydroxy vitamin D Lcms D2+D3         C-reactive protein     Imaging Orders         DG Thoracic Spine 2 View         DG Lumbar Spine Complete W/Bend         DG Shoulder Right         DG Shoulder Left         DG HIP UNILAT W OR W/O PELVIS 2-3 VIEWS RIGHT         DG HIP UNILAT W OR W/O PELVIS 2-3 VIEWS LEFT         DG Knee Complete 4 Views Right         DG Knee Complete 4 Views Left     Referral Orders  No referral(s) requested today   Procedure Orders    No procedure(s) ordered today   Pharmacotherapy (current): Medications ordered:  No orders of the defined types were placed in this encounter.  Medications administered during this visit: Barry Dienes had no medications administered during this visit.   Analgesic Pharmacotherapy:  Opioid Analgesics: For patients currently taking or requesting to take opioid analgesics, in accordance with [place], we will assess their risks and indications for the use of these substances. After completing our evaluation, we may offer recommendations, but we no longer take patients for medication management. The prescribing physician will ultimately decide, based on his/her training and level of comfort whether to adopt any of the recommendations, including whether or not to prescribe such medicines.  Membrane stabilizer: To be determined at a later time  Muscle relaxant: To be determined at a later time  NSAID: To be determined at a later time  Other analgesic(s): To be determined at a later time   Interventional management options: Ms. Morreale was informed that there is no guarantee that she would be a candidate for interventional therapies. The decision will be based on the results of diagnostic studies, as well as Ms. Cudd's risk profile.  Procedure(s) under consideration:  Pending results of ordered  studies      Interventional Therapies  Risk  Complexity Considerations:   Estimated body mass index is 32.92 kg/m as calculated from the following:   Height as of this encounter: 5' 2" (1.575 m).   Weight as of this encounter: 180 lb (81.6 kg). WNL   Planned  Pending:   See above for possible orders   Under consideration:   Pending completion of evaluation   Completed:   None at this time   Completed by other providers:   None at this time   Therapeutic  Palliative (PRN) options:   None established      Provider-requested follow-up: Return for (19mn), Eval-day (M,W), (F2F), 2nd Visit, for review  of ordered tests.  Future Appointments  Date Time Provider [place]  05/08/2022  9:00 AM Milinda Pointer, MD [facility] None  06/30/2022  9:20 AM Glean Hess, MD [facility]    Note by: Gaspar Cola, MD Date: 03/27/2022; Time: 12:28 PM

## 2022-03-27 NOTE — Progress Notes (Signed)
Safety precautions to be maintained throughout the outpatient stay will include: orient to surroundings, keep bed in low position, maintain call bell within reach at all times, provide assistance with transfer out of bed and ambulation.  

## 2022-03-28 LAB — URINE DRUGS OF ABUSE SCREEN W ALC, ROUTINE (REF LAB)
Amphetamines, Urine: NEGATIVE ng/mL
Barbiturate, Ur: NEGATIVE ng/mL
Benzodiazepine Quant, Ur: NEGATIVE ng/mL
Cannabinoid Quant, Ur: NEGATIVE ng/mL
Cocaine (Metab.): NEGATIVE ng/mL
Ethanol U, Quan: NEGATIVE %
Methadone Screen, Urine: NEGATIVE ng/mL
Opiate Quant, Ur: NEGATIVE ng/mL
Phencyclidine, Ur: NEGATIVE ng/mL
Propoxyphene, Urine: NEGATIVE ng/mL

## 2022-03-29 DIAGNOSIS — Z79899 Other long term (current) drug therapy: Secondary | ICD-10-CM | POA: Diagnosis not present

## 2022-03-29 DIAGNOSIS — L700 Acne vulgaris: Secondary | ICD-10-CM | POA: Diagnosis not present

## 2022-03-30 ENCOUNTER — Other Ambulatory Visit: Payer: Self-pay | Admitting: Internal Medicine

## 2022-03-30 DIAGNOSIS — R0981 Nasal congestion: Secondary | ICD-10-CM

## 2022-03-31 ENCOUNTER — Other Ambulatory Visit: Payer: Self-pay | Admitting: Internal Medicine

## 2022-03-31 DIAGNOSIS — B354 Tinea corporis: Secondary | ICD-10-CM

## 2022-03-31 NOTE — Telephone Encounter (Signed)
Requested Prescriptions  Pending Prescriptions Disp Refills   fluticasone (FLONASE) 50 MCG/ACT nasal spray [Pharmacy Med Name: FLUTICASONE PROP 50 MCG SPRAY] 16 mL 2    Sig: SPRAY 2 SPRAYS INTO EACH NOSTRIL EVERY DAY     Ear, Nose, and Throat: Nasal Preparations - Corticosteroids Passed - 03/30/2022 10:58 PM      Passed - Valid encounter within last 12 months    Recent Outpatient Visits           5 months ago B12 nutritional deficiency   [place] [facility] at [facility], Jesse Sans, MD   6 months ago Chronic sacroiliac joint pain   [place] [facility] at [place], Earley Abide, MD   7 months ago B12 nutritional deficiency   [facility] at [facility], Jesse Sans, MD   8 months ago Lumbosacral spondylosis with radiculopathy    [facility] at [facility], Earley Abide, MD   9 months ago Annual physical exam   [facility] at [facility], Jesse Sans, MD       Future Appointments             In 3 months Army Melia, Jesse Sans, MD [place] [facility] at [facility], [facility]

## 2022-03-31 NOTE — Telephone Encounter (Signed)
Requested medication (s) are due for refill today: yes  Requested medication (s) are on the active medication list: yes  Last refill:  02/28/22 #30g 0 refills  Future visit scheduled: yes in 3 months  Notes to clinic:  medication not assigned to a protocol. Do you want to refill Rx?     Requested Prescriptions  Pending Prescriptions Disp Refills   nystatin ointment (MYCOSTATIN) [Pharmacy Med Name: NYSTATIN 100,000 UNIT/GM OINT] 30 g 0    Sig: APPLY TO AFFECTED AREA TWICE A DAY     Off-Protocol Failed - 03/31/2022  9:16 AM      Failed - Medication not assigned to a protocol, review manually.      Passed - Valid encounter within last 12 months    Recent Outpatient Visits           5 months ago B12 nutritional deficiency   [place] [facility] at [facility], Jesse Sans, MD   6 months ago Chronic sacroiliac joint pain   [place] [facility] at [place], Earley Abide, MD   7 months ago B12 nutritional deficiency   [facility] at [facility], Jesse Sans, MD   8 months ago Lumbosacral spondylosis with radiculopathy   [place] [facility] at [facility], Earley Abide, MD   9 months ago Annual physical exam   [facility] and Sports Medicine at [facility], Jesse Sans, MD       Future Appointments             In 3 months Army Melia, Jesse Sans, MD [place] [facility] at [facility], [facility]

## 2022-04-03 ENCOUNTER — Other Ambulatory Visit: Payer: Self-pay | Admitting: Internal Medicine

## 2022-04-03 ENCOUNTER — Other Ambulatory Visit: Payer: Self-pay

## 2022-04-03 DIAGNOSIS — Z308 Encounter for other contraceptive management: Secondary | ICD-10-CM

## 2022-04-03 DIAGNOSIS — L700 Acne vulgaris: Secondary | ICD-10-CM

## 2022-04-03 DIAGNOSIS — L659 Nonscarring hair loss, unspecified: Secondary | ICD-10-CM

## 2022-04-03 NOTE — Telephone Encounter (Signed)
Copied from [place] [id number]. Topic: Referral - Status >> Apr 03, 2022  2:58 PM Cyndi Bender wrote: Reason for CRM: Pt requests that a new referral for acne and hair loss be sent to [facility]

## 2022-04-03 NOTE — Telephone Encounter (Signed)
Referral placed for derm

## 2022-04-04 ENCOUNTER — Encounter: Payer: Self-pay | Admitting: Internal Medicine

## 2022-04-04 ENCOUNTER — Ambulatory Visit: Payer: Medicaid Other | Admitting: Internal Medicine

## 2022-04-04 VITALS — BP 108/68 | HR 88 | Ht 62.0 in | Wt 188.0 lb

## 2022-04-04 DIAGNOSIS — H1030 Unspecified acute conjunctivitis, unspecified eye: Secondary | ICD-10-CM | POA: Diagnosis not present

## 2022-04-04 MED ORDER — CIPROFLOXACIN HCL 0.3 % OP SOLN: 2.0000 [drp] | OPHTHALMIC | 0 refills | Status: DC

## 2022-04-04 NOTE — Progress Notes (Signed)
Date:  04/04/2022   Name:  Christine Arellano   DOB:  03-12-1991   MRN:  [id number]   Chief Complaint: Conjunctivitis  Conjunctivitis  Episode onset: 3 weeks. The onset was gradual. The problem has been gradually worsening. The problem is moderate. Nothing relieves the symptoms. Nothing aggravates the symptoms. Associated symptoms include decreased vision, double vision, eye itching, eye discharge, eye pain and eye redness. Pertinent negatives include no fever, no congestion and no cough. The eye pain is moderate. The right eye is affected. The eye pain is not associated with movement. The eyelid exhibits swelling and redness. She has been Behaving normally. She has been Eating and drinking normally. Urine output has been normal. The last void occurred Less than 6 hours ago. There were sick contacts at home. She has received no recent medical care.    Lab Results  Component Value Date   NA 133 (L) 03/27/2022   K 4.0 03/27/2022   CO2 23 03/27/2022   GLUCOSE 82 03/27/2022   BUN 14 03/27/2022   CREATININE 0.58 03/27/2022   CALCIUM 9.0 03/27/2022   EGFR 106 10/31/2021   GFRNONAA >60 03/27/2022   Lab Results  Component Value Date   CHOL 259 (H) 06/29/2021   HDL 87 06/29/2021   LDLCALC 141 (H) 06/29/2021   TRIG 179 (H) 06/29/2021   CHOLHDL 3.0 06/29/2021   Lab Results  Component Value Date   TSH 1.920 06/29/2021   No results found for: "HGBA1C" Lab Results  Component Value Date   WBC 9.2 10/31/2021   HGB 12.9 10/31/2021   HCT 37.3 10/31/2021   MCV 87 10/31/2021   PLT 379 10/31/2021   Lab Results  Component Value Date   ALT 11 03/27/2022   AST 17 03/27/2022   ALKPHOS 48 03/27/2022   BILITOT 0.4 03/27/2022   Lab Results  Component Value Date   VD25OH 61.38 03/27/2022     Review of Systems  Constitutional:  Negative for chills and fever.  HENT:  Negative for congestion and sinus pressure.   Eyes:  Positive for double vision, pain, discharge, redness and itching.   Respiratory:  Negative for cough and shortness of breath.   Cardiovascular:  Negative for chest pain.  Musculoskeletal:  Positive for back pain and myalgias.    Patient Active Problem List   Diagnosis Date Noted   Chronic pain syndrome 03/27/2022   Pharmacologic therapy 03/27/2022   Disorder of skeletal system 03/27/2022   Problems influencing health status 03/27/2022   Chronic knee pain (3ry area of Pain) (Bilateral) (R>L) 03/27/2022   Chronic upper back pain (4th area of Pain) (Midline) (Bilateral) 03/27/2022   Chronic shoulder-blade pain (Bilateral) 03/27/2022   Chronic shoulder pain (5th area of Pain) (Bilateral) (R>L) 03/27/2022   Chronic hip pain (6th area of Pain) (Bilateral) (R>L) 03/27/2022   Stiffness of right knee 11/16/2021   Fibromyalgia syndrome (7th area of Pain) 09/22/2021   Polyarthralgia 09/22/2021   Chronic sacroiliac joint pain (Bilateral) 09/14/2021   B12 nutritional deficiency 08/30/2021   Lumbosacral spondylosis with radiculopathy 08/03/2021   Chronic low back pain (1ry area of Pain) (Bilateral) (R>L) w/o sciatica 03/30/2021   BMI 33.0-33.9,adult 03/30/2021   Migraine with aura (8th area of Pain) 01/26/2021   Inverted nipple 05/05/2019   Moderate episode of recurrent major depressive disorder ([place]) 05/05/2019   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   Generalized anxiety disorder 03/04/2019   Alopecia of scalp 03/04/2019   Dry eye syndrome of both eyes 10/18/2017  Chronic lower extremity pain (2ry area of Pain) (Bilateral) (R>L) 07/06/2016   Adult acne 05/03/2015    No Known Allergies  Past Surgical History:  Procedure Laterality Date   arthroscopic knee Right 2008   ARTHROSCOPIC REPAIR PCL Right 10/14/2021   CESAREAN SECTION  2014   CESAREAN SECTION N/A 02/24/2015   Procedure: CESAREAN SECTION;  Surgeon: Rubie Maid, MD;  Location: [facility];  Service: Obstetrics;  Laterality: N/A;   CESAREAN SECTION N/A 05/09/2017   Procedure: REPEAT CESAREAN SECTION;   Surgeon: Rubie Maid, MD;  Location: [facility];  Service: Obstetrics;  Laterality: N/A;   HEMORROIDECTOMY     NASAL ENDOSCOPY Bilateral 01/05/2022   Procedure: VIVAER NASAL VALVE REPAIR;  Surgeon: Margaretha Sheffield, MD;  Location: [place];  Service: ENT;  Laterality: Bilateral;   NASAL TURBINATE REDUCTION Bilateral 01/05/2022   Procedure: INFERIOR TURBINATE REDUCTION;  Surgeon: Margaretha Sheffield, MD;  Location: [place];  Service: ENT;  Laterality: Bilateral;   SEPTOPLASTY N/A 01/05/2022   Procedure: SEPTOPLASTY;  Surgeon: Margaretha Sheffield, MD;  Location: [place];  Service: ENT;  Laterality: N/A;   THERAPEUTIC ABORTION  08/2018   fetal CNS abnormalities   TONSILLECTOMY     WISDOM TOOTH EXTRACTION      Social History   Tobacco Use   Smoking status: Former    Packs/day: 1.00    Types: Cigarettes    Quit date: 08/27/2009    Years since quitting: 12.6   Smokeless tobacco: Never  Vaping Use   Vaping Use: Never used  Substance Use Topics   Alcohol use: Not Currently    Alcohol/week: 0.0 standard drinks of alcohol   Drug use: No     Medication list has been reviewed and updated.  Current Meds  Medication Sig   celecoxib (CELEBREX) 200 MG capsule ONE TO 2 CAPS BY MOUTH DAILY AS NEEDED FOR PAIN.   cyanocobalamin (VITAMIN B12) 1000 MCG tablet TAKE 1 TABLET BY MOUTH EVERY DAY   FLUoxetine HCl 60 MG TABS fluoxetine 60 mg tablet  TAKE 1 TABLET BY MOUTH EVERY DAY IN THE MORNING   fluticasone (FLONASE) 50 MCG/ACT nasal spray SPRAY 2 SPRAYS INTO EACH NOSTRIL EVERY DAY   hydrOXYzine (VISTARIL) 50 MG capsule SMARTSIG:1 Capsule(s) By Mouth 1-2 Times Daily PRN   ISOtretinoin (ACCUTANE) 40 MG capsule Claravis 40 mg capsule  TAKE ONE CAPSULE BY MOUTH EVERY DAY WITH A FATTY MEAL   Lifitegrast (XIIDRA) 5 % SOLN PLACE ONE IN BOTH EYES TWICE A DAY   norgestimate-ethinyl estradiol (ORTHO-CYCLEN) 0.25-35 MG-MCG tablet TAKE 1 TABLET BY MOUTH EVERY DAY   nystatin ointment  (MYCOSTATIN) APPLY TO AFFECTED AREA TWICE A DAY   polyethylene glycol powder (GLYCOLAX/MIRALAX) 17 GM/SCOOP powder Take 17 g by mouth 2 (two) times daily as needed.   pregabalin (LYRICA) 25 MG capsule Take 25 mg by mouth 2 (two) times daily.   spironolactone (ALDACTONE) 100 MG tablet Take 200 mg by mouth daily.   traZODone (DESYREL) 50 MG tablet TAKE 1 TABLET BY MOUTH EVERY DAY AT BEDTIME AS NEEDED FOR SLEEP   Vitamin D, Ergocalciferol, (DRISDOL) 1.25 MG (50000 UNIT) CAPS capsule Take 50,000 Units by mouth once a week.   [DISCONTINUED] gabapentin (NEURONTIN) 300 MG capsule Take 300 mg by mouth 3 (three) times daily as needed.   [DISCONTINUED] tiZANidine (ZANAFLEX) 4 MG tablet TAKE 1 TABLET BY MOUTH EVERYDAY AT BEDTIME       04/04/2022    4:19 PM 10/31/2021   11:11 AM 09/14/2021  9:26 AM 08/30/2021    8:09 AM  GAD 7 : Generalized Anxiety Score  Nervous, Anxious, on Edge _0 Control/stop worrying _1 Worry too much - different things _2 Trouble relaxing _3 0  Restless 2 1 0 1  Easily annoyed or irritable _4 Afraid - awful might happen _5 0  Total GAD 7 Score _6 Anxiety Difficulty Very difficult Very difficult Somewhat difficult Somewhat difficult       04/04/2022    4:19 PM 03/27/2022    9:08 AM 10/31/2021   11:10 AM  Depression screen PHQ 2/9  Decreased Interest 3 0 3  Down, Depressed, Hopeless 2 0 3  PHQ - 2 Score 5 0 6  Altered sleeping 2  3  Tired, decreased energy 3  3  Change in appetite 3  3  Feeling bad or failure about yourself  3  2  Trouble concentrating 2  2  Moving slowly or fidgety/restless 1  1  Suicidal thoughts 0  0  PHQ-9 Score 19  20  Difficult doing work/chores Very difficult  Very difficult    BP Readings from Last 3 Encounters:  04/04/22 108/68  03/27/22 118/79  01/05/22 108/69    Physical Exam Vitals and nursing note reviewed.  Constitutional:      General: She is not in acute distress.    Appearance: She is  well-developed.  HENT:     Head: Normocephalic and atraumatic.  Eyes:     Extraocular Movements: Extraocular movements intact.     Conjunctiva/sclera:     Right eye: Right conjunctiva is injected.  Pulmonary:     Effort: Pulmonary effort is normal. No respiratory distress.  Skin:    General: Skin is warm and dry.     Findings: No rash.  Neurological:     Mental Status: She is alert and oriented to person, place, and time.  Psychiatric:        Mood and Affect: Mood normal.        Behavior: Behavior normal.     Wt Readings from Last 3 Encounters:  04/04/22 188 lb (85.3 kg)  03/27/22 180 lb (81.6 kg)  01/05/22 176 lb (79.8 kg)    BP 108/68   Pulse 88   Ht _7  (1.575 m)   Wt 188 lb (85.3 kg)   LMP 02/22/2022 (Approximate)   SpO2 99%   BMI 34.39 kg/m   Assessment and Plan: 1. Acute conjunctivitis, unspecified acute conjunctivitis type, unspecified laterality Use baby shampoo to wash lids as needed Otherwise warm compresses for comfort Return if no improvement - ciprofloxacin (CILOXAN) 0.3 % ophthalmic solution; Place 2 drops into both eyes every 4 (four) hours while awake.  Dispense: 5 mL; Refill: 0   Partially dictated using Editor, commissioning. Any errors are unintentional.  [name], MD [facility]  04/04/2022

## 2022-04-10 ENCOUNTER — Other Ambulatory Visit: Payer: Self-pay

## 2022-04-10 DIAGNOSIS — H1030 Unspecified acute conjunctivitis, unspecified eye: Secondary | ICD-10-CM

## 2022-04-10 MED ORDER — CIPROFLOXACIN HCL 0.3 % OP SOLN: 2.0000 [drp] | OPHTHALMIC | 0 refills | Status: DC

## 2022-04-11 ENCOUNTER — Telehealth: Payer: Self-pay | Admitting: Internal Medicine

## 2022-04-11 NOTE — Telephone Encounter (Signed)
Sent patient my chart message informing she will need to buy stool softener over the counter.   CM

## 2022-04-11 NOTE — Telephone Encounter (Signed)
Medication Refill - Medication: colace pill  Pt stated PCP prescribes MiraLAX wondering if she can be prescribed Colace pill instead as its covered by her insurance and she forgets to take the [place].   Has the patient contacted their pharmacy? Yes.   (Agent: If no, request that the patient contact the pharmacy for the refill. If patient does not wish to contact the pharmacy document the reason why and proceed with request.) (Agent: If yes, when and what did the pharmacy advise?)  Preferred Pharmacy (with phone number or street name):  [id number] [place] [id number] Phone: [phone]: [phone] Hours: Not open 24 hours   Has the patient been seen for an appointment in the last year OR does the patient have an upcoming appointment? Yes.    Agent: Please be advised that RX refills may take up to 3 business days. We ask that you follow-up with your pharmacy.

## 2022-04-22 NOTE — Progress Notes (Unsigned)
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   Therapeutic  Palliative (PRN) options:   None established       Provider-requested follow-up: No follow-ups on file. Recent Visits Date Type Provider Dept  03/27/22 Office Visit Milinda Pointer, MD [facility]  Showing recent visits within past 90 days and meeting all other requirements Future Appointments Date Type Provider Dept  04/26/22 Appointment Milinda Pointer, MD [facility]  Showing future appointments within next 90 days and meeting all other requirements  Primary Care Physician: Glean Hess, MD Note by: Gaspar Cola, MD Date: 04/26/2022; Time: 3:35 PM

## 2022-04-26 ENCOUNTER — Encounter: Payer: Self-pay | Admitting: Pain Medicine

## 2022-04-26 ENCOUNTER — Ambulatory Visit: Payer: Medicaid Other | Attending: Pain Medicine | Admitting: Pain Medicine

## 2022-04-26 VITALS — BP 124/82 | HR 83 | Temp 97.2°F | Ht 62.0 in | Wt 188.0 lb

## 2022-04-26 DIAGNOSIS — M545 Low back pain, unspecified: Secondary | ICD-10-CM | POA: Insufficient documentation

## 2022-04-26 DIAGNOSIS — M47816 Spondylosis without myelopathy or radiculopathy, lumbar region: Secondary | ICD-10-CM | POA: Insufficient documentation

## 2022-04-26 DIAGNOSIS — M79605 Pain in left leg: Secondary | ICD-10-CM | POA: Insufficient documentation

## 2022-04-26 DIAGNOSIS — G8929 Other chronic pain: Secondary | ICD-10-CM | POA: Insufficient documentation

## 2022-04-26 DIAGNOSIS — M797 Fibromyalgia: Secondary | ICD-10-CM | POA: Insufficient documentation

## 2022-04-26 DIAGNOSIS — M549 Dorsalgia, unspecified: Secondary | ICD-10-CM | POA: Diagnosis not present

## 2022-04-26 DIAGNOSIS — M533 Sacrococcygeal disorders, not elsewhere classified: Secondary | ICD-10-CM | POA: Insufficient documentation

## 2022-04-26 DIAGNOSIS — M25552 Pain in left hip: Secondary | ICD-10-CM | POA: Insufficient documentation

## 2022-04-26 DIAGNOSIS — M25512 Pain in left shoulder: Secondary | ICD-10-CM | POA: Insufficient documentation

## 2022-04-26 DIAGNOSIS — M25562 Pain in left knee: Secondary | ICD-10-CM | POA: Diagnosis not present

## 2022-04-26 DIAGNOSIS — M79604 Pain in right leg: Secondary | ICD-10-CM | POA: Insufficient documentation

## 2022-04-26 DIAGNOSIS — M25561 Pain in right knee: Secondary | ICD-10-CM | POA: Insufficient documentation

## 2022-04-26 DIAGNOSIS — M25551 Pain in right hip: Secondary | ICD-10-CM | POA: Insufficient documentation

## 2022-04-26 DIAGNOSIS — G894 Chronic pain syndrome: Secondary | ICD-10-CM | POA: Insufficient documentation

## 2022-04-26 DIAGNOSIS — M25511 Pain in right shoulder: Secondary | ICD-10-CM | POA: Diagnosis not present

## 2022-04-26 NOTE — Progress Notes (Signed)
Safety precautions to be maintained throughout the outpatient stay will include: orient to surroundings, keep bed in low position, maintain call bell within reach at all times, provide assistance with transfer out of bed and ambulation.  

## 2022-04-29 ENCOUNTER — Other Ambulatory Visit: Payer: Self-pay | Admitting: Family Medicine

## 2022-04-29 ENCOUNTER — Other Ambulatory Visit: Payer: Self-pay | Admitting: Internal Medicine

## 2022-04-29 DIAGNOSIS — M4727 Other spondylosis with radiculopathy, lumbosacral region: Secondary | ICD-10-CM

## 2022-04-29 DIAGNOSIS — Z308 Encounter for other contraceptive management: Secondary | ICD-10-CM

## 2022-04-29 DIAGNOSIS — B354 Tinea corporis: Secondary | ICD-10-CM

## 2022-04-29 DIAGNOSIS — G8929 Other chronic pain: Secondary | ICD-10-CM

## 2022-05-01 NOTE — Telephone Encounter (Signed)
Requested Prescriptions  Pending Prescriptions Disp Refills   nystatin ointment (MYCOSTATIN) [Pharmacy Med Name: NYSTATIN 100,000 UNIT/GM OINT] 30 g 0    Sig: APPLY TO AFFECTED AREA TWICE A DAY     Off-Protocol Failed - 04/29/2022  4:28 PM      Failed - Medication not assigned to a protocol, review manually.      Passed - Valid encounter within last 12 months    Recent Outpatient Visits           3 weeks ago Acute conjunctivitis, unspecified acute conjunctivitis type, unspecified laterality   [place] [facility] at [facility], Jesse Sans, MD   6 months ago B12 nutritional deficiency   [facility] at [facility], Jesse Sans, MD   7 months ago Chronic sacroiliac joint pain   [place] [facility] at [place], Earley Abide, MD   8 months ago B12 nutritional deficiency   [facility] at [facility], Jesse Sans, MD   9 months ago Lumbosacral spondylosis with radiculopathy   [place] [facility] at [place], Earley Abide, MD       Future Appointments             In 2 months Army Melia Jesse Sans, MD [facility] at [facility], [facility]             norgestimate-ethinyl estradiol ([place]) 0.25-35 MG-MCG tablet [Pharmacy Med Name: [place] Sewer 0.25-0.035 MG] 84 tablet 0    Sig: TAKE 1 TABLET BY MOUTH EVERY DAY     OB/GYN:  Contraceptives Passed - 04/29/2022  4:28 PM      Passed - Last BP in normal range    BP Readings from Last 1 Encounters:  04/26/22 124/82         Passed - Valid encounter within last 12 months    Recent Outpatient Visits           3 weeks ago Acute conjunctivitis, unspecified acute conjunctivitis type, unspecified laterality   [place] [facility] at [facility], Jesse Sans, MD   6 months ago  B12 nutritional deficiency   [facility] at [facility], Jesse Sans, MD   7 months ago Chronic sacroiliac joint pain   [place] [facility] at [place], Earley Abide, MD   8 months ago B12 nutritional deficiency   [facility] at [facility], Jesse Sans, MD   9 months ago Lumbosacral spondylosis with radiculopathy   [facility] Care and Sports Medicine at [facility], Earley Abide, MD       Future Appointments             In 2 months Army Melia Jesse Sans, MD [facility] Primary Care and Sports Medicine at [facility], [place] - Patient is not a smoker

## 2022-05-01 NOTE — Telephone Encounter (Signed)
Requested Prescriptions  Pending Prescriptions Disp Refills   celecoxib (CELEBREX) 200 MG capsule [Pharmacy Med Name: CELECOXIB 200 MG CAPSULE] 60 capsule 2    Sig: ONE TO 2 CAPS BY MOUTH DAILY AS NEEDED FOR PAIN.     Analgesics:  COX2 Inhibitors Failed - 04/29/2022  4:28 PM      Failed - Manual Review: Labs are only required if the patient has taken medication for more than 8 weeks.      Passed - HGB in normal range and within 360 days    Hemoglobin  Date Value Ref Range Status  10/31/2021 12.9 11.1 - 15.9 g/dL Final  07/21/2014 13.8 g/dL Final         Passed - Cr in normal range and within 360 days    Creatinine, Ser  Date Value Ref Range Status  03/27/2022 0.58 0.44 - 1.00 mg/dL Final         Passed - HCT in normal range and within 360 days    HCT  Date Value Ref Range Status  07/21/2014 41 % Final   Hematocrit  Date Value Ref Range Status  10/31/2021 37.3 34.0 - 46.6 % Final         Passed - AST in normal range and within 360 days    AST  Date Value Ref Range Status  03/27/2022 17 15 - 41 U/L Final         Passed - ALT in normal range and within 360 days    ALT  Date Value Ref Range Status  03/27/2022 11 0 - 44 U/L Final         Passed - eGFR is 30 or above and within 360 days    GFR calc Af Amer  Date Value Ref Range Status  05/31/2018 154 >59 mL/min/1.73 Final   GFR, Estimated  Date Value Ref Range Status  03/27/2022 >60 >60 mL/min Final    Comment:    (NOTE) Calculated using the CKD-EPI Creatinine Equation (2021)    eGFR  Date Value Ref Range Status  10/31/2021 106 >59 mL/min/1.73 Final         Passed - Patient is not pregnant      Passed - Valid encounter within last 12 months    Recent Outpatient Visits           3 weeks ago Acute conjunctivitis, unspecified acute conjunctivitis type, unspecified laterality   [place] [facility] at [facility], Jesse Sans, MD   6 months ago B12 nutritional deficiency    [facility] at [facility], Jesse Sans, MD   7 months ago Chronic sacroiliac joint pain   [place] [facility] at [place], Earley Abide, MD   8 months ago B12 nutritional deficiency   [facility] at [facility], Jesse Sans, MD   9 months ago Lumbosacral spondylosis with radiculopathy   [facility] at [facility], Earley Abide, MD       Future Appointments             In 2 months Army Melia, Jesse Sans, MD [place] [facility] at [facility], [facility]

## 2022-05-04 DIAGNOSIS — L700 Acne vulgaris: Secondary | ICD-10-CM | POA: Diagnosis not present

## 2022-05-04 DIAGNOSIS — L648 Other androgenic alopecia: Secondary | ICD-10-CM | POA: Diagnosis not present

## 2022-05-05 DIAGNOSIS — Z79899 Other long term (current) drug therapy: Secondary | ICD-10-CM | POA: Diagnosis not present

## 2022-05-05 DIAGNOSIS — F429 Obsessive-compulsive disorder, unspecified: Secondary | ICD-10-CM | POA: Diagnosis not present

## 2022-05-05 DIAGNOSIS — F339 Major depressive disorder, recurrent, unspecified: Secondary | ICD-10-CM | POA: Diagnosis not present

## 2022-05-05 DIAGNOSIS — Z1389 Encounter for screening for other disorder: Secondary | ICD-10-CM | POA: Diagnosis not present

## 2022-05-05 DIAGNOSIS — F332 Major depressive disorder, recurrent severe without psychotic features: Secondary | ICD-10-CM | POA: Diagnosis not present

## 2022-05-08 ENCOUNTER — Ambulatory Visit: Payer: Medicaid Other | Admitting: Pain Medicine

## 2022-05-09 ENCOUNTER — Telehealth: Payer: Self-pay

## 2022-05-09 NOTE — Telephone Encounter (Signed)
LM for patient to call office for pre virtual appointment questions.  

## 2022-05-09 NOTE — Progress Notes (Unsigned)
Patient: Christine Arellano  Service Category: E/M  Provider: Gaspar Cola, MD  DOB: [birth year]  DOS: 05/10/2022  Location: Office  MRN: [id number]  Setting: Ambulatory outpatient  Referring Provider: Glean Hess, MD  Type: Established Patient  Specialty: Interventional Pain Management  PCP: Glean Hess, MD  Location: Remote location  Delivery: TeleHealth     Virtual Encounter - Pain Management PROVIDER NOTE: Information contained herein reflects review and annotations entered in association with encounter. Interpretation of such information and data should be left to medically-trained personnel. Information provided to patient can be located elsewhere in the medical record under "Patient Instructions". Document created using STT-dictation technology, any transcriptional errors that may result from process are unintentional.    Contact & Pharmacy Preferred: [phone] Home: [phone] (home) Mobile: [phone] (mobile) E-mail: [email or web address]  [facility], [place] [address] Phone: [phone] Fax: [phone]  [id number] [id number]: [phone]: [phone]  Pre-screening  Christine Arellano offered "in-person" vs "virtual" encounter. She indicated preferring virtual for this encounter.   Reason COVID-19*  Social distancing based on CDC and AMA recommendations.   I contacted Christine LANGFORDon 05/10/2022 via telephone.      I clearly identified myself as FGaspar Cola MD. I verified that I was speaking with the correct person using two identifiers (Name: [name] and date of birth: [id number].  Consent I sought verbal advanced consent from LBarry Dienesfor virtual visit interactions. I informed Christine Arellano of possible security and privacy concerns, risks, and limitations associated with providing  "not-in-person" medical evaluation and management services. I also informed Christine Arellano of the availability of "in-person" appointments. Finally, I informed her that there would be a charge for the virtual visit and that she could be  personally, fully or partially, financially responsible for it. Ms. RPointerexpressed understanding and agreed to proceed.   Historic Elements   Ms. LCOLENE MINESis a 31y.o. year old, female patient evaluated today after our last contact on 04/26/2022. [name] has a past medical history of Acute midline thoracic back pain (05/05/2019), Anxiety, Depression, GERD (gastroesophageal reflux disease), Headache, History of IBS, Insomnia, Knee pain (07/06/2016), Lumbar back pain, Migraine with aura, Mood disorder (HWilliams (03/21/2016), normal karyotype after Abnormal chromosomal and genetic finding on antenatal screening mother, and S/P cesarean section (02/24/2015). She also  has a past surgical history that includes Hemorroidectomy; Tonsillectomy; arthroscopic knee (Right, 2008); Wisdom tooth extraction; Cesarean section (2014); Cesarean section (N/A, 02/24/2015); Cesarean section (N/A, 05/09/2017); Therapeutic abortion (08/2018); Arthroscopic repair PCL (Right, 10/14/2021); Septoplasty (N/A, 01/05/2022); Nasal turbinate reduction (Bilateral, 01/05/2022); and Nasal endoscopy (Bilateral, 01/05/2022). Ms. RIversenhas a current medication list which includes the following prescription(s): minoxidil, celecoxib, cyanocobalamin, fluoxetine hcl, fluticasone, hydroxyzine, xiidra, norgestimate-ethinyl estradiol, nystatin ointment, polyethylene glycol powder, pregabalin, spironolactone, trazodone, and vitamin d (ergocalciferol). She  reports that she quit smoking about 12 years ago. Her smoking use included cigarettes. She smoked an average of 1 pack per day. She has never used smokeless tobacco. She reports that she does not currently use alcohol. She reports that she does not use drugs. Ms.  RMordanhas No Known Allergies.  Estimated body mass index is 34.39 kg/m as calculated from the following:   Height as of 04/26/22: _1  (1.575 m).   Weight as of  04/26/22: 188 lb (85.3 kg).  HPI  Today, she is being contacted for  evaluation of the results of an MRI the patient refers having had done at Dr. Gwinda Maine' practice.  Requested on 04/26/2022.  ***  Pharmacotherapy Assessment   Opioid Analgesic: None MME/day: 0 mg/day   Monitoring: [place] PMP: PDMP reviewed during this encounter.       Pharmacotherapy: No side-effects or adverse reactions reported. Compliance: No problems identified. Effectiveness: Clinically acceptable. Plan: Refer to "POC". UDS: No results found for: "SUMMARY" No results found for: "CBDTHCR", "D8THCCBX", "D9THCCBX"   Laboratory Chemistry Profile   Renal Lab Results  Component Value Date   BUN 14 03/27/2022   CREATININE 0.58 03/27/2022   BCR 18 10/31/2021   GFRAA 154 05/31/2018   GFRNONAA >60 03/27/2022    Hepatic Lab Results  Component Value Date   AST 17 03/27/2022   ALT 11 03/27/2022   ALBUMIN 3.9 03/27/2022   ALKPHOS 48 03/27/2022    Electrolytes Lab Results  Component Value Date   NA 133 (Christine) 03/27/2022   K 4.0 03/27/2022   CL 103 03/27/2022   CALCIUM 9.0 03/27/2022   MG 1.7 03/27/2022    Bone Lab Results  Component Value Date   VD25OH 61.38 03/27/2022    Inflammation (CRP: Acute Phase) (ESR: Chronic Phase) Lab Results  Component Value Date   CRP 0.6 03/27/2022   ESRSEDRATE 4 03/27/2022         Note: Above Lab results reviewed.  Imaging  DG Knee Complete 4 Views Left CLINICAL DATA:  Left knee pain/arthralgia. Chronic pain of both knees.  EXAM: LEFT KNEE - COMPLETE 4+ VIEW  COMPARISON:  None Available.  FINDINGS: No evidence of fracture, dislocation, or joint effusion. Normal alignment. Joint spaces are normal. No evidence of arthropathy or other focal bone abnormality. Soft tissues are  unremarkable.  IMPRESSION: Negative radiographs of the left knee.  Electronically Signed   By: Keith Rake M.D.   On: 03/28/2022 22:00 DG Knee Complete 4 Views Right CLINICAL DATA:  Right knee pain/arthralgia. Chronic pain in both knees.  EXAM: RIGHT KNEE - COMPLETE 4+ VIEW  COMPARISON:  05/16/2021  FINDINGS: ACL repair with tibial and femoral tunnels. Screw and surgical staple in the upper tibia. Normal knee joint alignment. The joint spaces are preserved. There is trace peripheral spurring of the medial tibiofemoral compartment. Chronic fragmentation of the anterior tibial tubercle. No knee joint effusion. No erosion, avascular necrosis or evidence of focal bone lesions.  IMPRESSION: 1. Minimal degenerative spurring of the medial tibiofemoral compartment. 2. ACL repair.  Electronically Signed   By: Keith Rake M.D.   On: 03/28/2022 21:59 DG HIP UNILAT W OR W/O PELVIS 2-3 VIEWS LEFT CLINICAL DATA:  Chronic hip pain, bilateral.  EXAM: DG HIP (WITH OR WITHOUT PELVIS) 2-3V RIGHT; DG HIP (WITH OR WITHOUT PELVIS) 2-3V LEFT  COMPARISON:  None Available.  FINDINGS: Right hip: The joint space is preserved. The femoral head is well seated. There is minimal acetabular spurring laterally. Small os acetabula. No erosion or avascular necrosis. No acute or evidence of prior fracture.  Left hip: The joint space is preserved. The femoral head is well seated. No erosion or avascular necrosis. No acute or evidence of prior fracture.  Bony pelvis. Cortical margins of the bony pelvis are intact. Intact pubic rami. The pubic symphysis and sacroiliac joints are congruent and normal. No evidence of fracture, focal bone lesion or bone destruction.  Unremarkable soft tissues.  IMPRESSION: 1.  Minimal right hip peripheral spurring and os acetabula. 2. Normal radiographs of the left hip.  Electronically Signed   By: Keith Rake M.D.   On: 03/28/2022 21:57 DG HIP  UNILAT W OR W/O PELVIS 2-3 VIEWS RIGHT CLINICAL DATA:  Chronic hip pain, bilateral.  EXAM: DG HIP (WITH OR WITHOUT PELVIS) 2-3V RIGHT; DG HIP (WITH OR WITHOUT PELVIS) 2-3V LEFT  COMPARISON:  None Available.  FINDINGS: Right hip: The joint space is preserved. The femoral head is well seated. There is minimal acetabular spurring laterally. Small os acetabula. No erosion or avascular necrosis. No acute or evidence of prior fracture.  Left hip: The joint space is preserved. The femoral head is well seated. No erosion or avascular necrosis. No acute or evidence of prior fracture.  Bony pelvis. Cortical margins of the bony pelvis are intact. Intact pubic rami. The pubic symphysis and sacroiliac joints are congruent and normal. No evidence of fracture, focal bone lesion or bone destruction.  Unremarkable soft tissues.  IMPRESSION: 1. Minimal right hip peripheral spurring and os acetabula. 2. Normal radiographs of the left hip.  Electronically Signed   By: Keith Rake M.D.   On: 03/28/2022 21:57 DG Shoulder Left CLINICAL DATA:  Shoulder blade pain. Chronic pain of both shoulders. Left shoulder pain.  EXAM: LEFT SHOULDER - 2+ VIEW  COMPARISON:  None Available.  FINDINGS: There is no evidence of fracture or dislocation. Normal joint spaces. Normal alignment. Intact scapula. There is no evidence of arthropathy or other focal bone abnormality. Soft tissues are unremarkable.  IMPRESSION: Negative radiographs of the left shoulder.  Electronically Signed   By: Keith Rake M.D.   On: 03/28/2022 21:55 DG Shoulder Right CLINICAL DATA:  Shoulder blade pain. Chronic pain of both shoulders.  EXAM: RIGHT SHOULDER - 2+ VIEW  COMPARISON:  None Available.  FINDINGS: There is no evidence of fracture or dislocation. Normal alignment. Normal joint spaces. Intact scapula. There is no evidence of arthropathy or other focal bone abnormality. Soft tissues  are unremarkable.  IMPRESSION: Negative radiographs of the right shoulder.  Electronically Signed   By: Keith Rake M.D.   On: 03/28/2022 21:54 DG Lumbar Spine Complete W/Bend CLINICAL DATA:  Chronic bilateral low back pain without sciatica. Chronic pain of lower extremity bilateral.  EXAM: LUMBAR SPINE - COMPLETE WITH BENDING VIEWS  COMPARISON:  Lumbar radiograph 05/16/2021  FINDINGS: There are 5 non-rib-bearing lumbar vertebra. Normal alignment and lumbar lordosis. Normal vertebral body heights. The disc spaces are preserved. There is no abnormal motion on flexion or extension. No significant facet changes. No evidence of fracture. No focal bone lesion, bony destruction or pars defects. The sacroiliac joints are congruent.  IMPRESSION: Normal radiographs of the lumbar spine.  Electronically Signed   By: Keith Rake M.D.   On: 03/28/2022 21:54 DG Thoracic Spine 2 View CLINICAL DATA:  Upper back and or thoracic spine pain. Shoulder blade pain. Chronic upper back pain.  EXAM: THORACIC SPINE 2 VIEWS  COMPARISON:  Thoracic spine radiograph 10/29/2013  FINDINGS: Twelve rib-bearing thoracic vertebra. Normal alignment. Normal thoracic kyphosis. Normal vertebral body heights. Slight disc space narrowing and anterior spurring at T8-T9 and T9-T10. No evidence of fracture, focal bone lesion or bone destruction. There are no paravertebral soft tissue abnormalities.  IMPRESSION: Mild degenerative disc disease at T8-T9 and T9-T10.  Electronically Signed   By: Keith Rake M.D.   On: 03/28/2022 21:53  Assessment  The primary encounter diagnosis was Chronic pain syndrome. Diagnoses of Chronic low  back pain (1ry area of Pain) (Bilateral) (R>Christine) w/o sciatica, Chronic lower extremity pain (2ry area of Pain) (Bilateral) (R>Christine), Chronic knee pain (3ry area of Pain) (Bilateral) (R>Christine), Chronic upper back pain (4th area of Pain) (Midline) (Bilateral), Chronic shoulder  pain (5th area of Pain) (Bilateral) (R>Christine), Chronic hip pain (6th area of Pain) (Bilateral) (R>Christine), and Fibromyalgia syndrome (7th area of Pain) were also pertinent to this visit.  Plan of Care  Problem-specific:  No problem-specific Assessment & Plan notes found for this encounter.  Ms. ARMANDA FORAND has a current medication list which includes the following long-term medication(s): fluticasone, norgestimate-ethinyl estradiol, pregabalin, spironolactone, and trazodone.  Pharmacotherapy (Medications Ordered): No orders of the defined types were placed in this encounter.  Orders:  No orders of the defined types were placed in this encounter.  Follow-up plan:   No follow-ups on file.     Interventional Therapies  Risk  Complexity Considerations:   Estimated body mass index is 32.92 kg/m as calculated from the following:   Height as of this encounter: _0  (1.575 m).   Weight as of this encounter: 180 lb (81.6 kg). WNL   Planned  Pending:   Pending to get the results of an MRI that the patient refers having had done at Dr. Gwinda Maine' practice.  (04/26/2022) requested.   Under consideration:   Pending completion of evaluation   Completed:   None at this time   Completed by other providers:   None at this time   Therapeutic  Palliative (PRN) options:   None established        Recent Visits Date Type Provider Dept  04/26/22 Office Visit Milinda Pointer, MD [facility]  03/27/22 Office Visit Milinda Pointer, MD [facility]  Showing recent visits within past 90 days and meeting all other requirements Future Appointments Date Type Provider Dept  05/10/22 Appointment Milinda Pointer, MD [facility]  Showing future appointments within next 90 days and meeting all other requirements  I discussed the assessment and treatment plan with the patient. The patient was provided an opportunity to ask questions and all were answered. The  patient agreed with the plan and demonstrated an understanding of the instructions.  Patient advised to call back or seek an in-person evaluation if the symptoms or condition worsens.  Duration of encounter: *** minutes.  Note by: Gaspar Cola, MD Date: 05/10/2022; Time: 2:30 PM

## 2022-05-10 ENCOUNTER — Ambulatory Visit: Payer: Medicaid Other | Attending: Pain Medicine | Admitting: Pain Medicine

## 2022-05-10 ENCOUNTER — Encounter: Payer: Self-pay | Admitting: Pain Medicine

## 2022-05-10 DIAGNOSIS — M545 Low back pain, unspecified: Secondary | ICD-10-CM | POA: Diagnosis not present

## 2022-05-10 DIAGNOSIS — M25552 Pain in left hip: Secondary | ICD-10-CM | POA: Diagnosis not present

## 2022-05-10 DIAGNOSIS — M25512 Pain in left shoulder: Secondary | ICD-10-CM | POA: Diagnosis not present

## 2022-05-10 DIAGNOSIS — M797 Fibromyalgia: Secondary | ICD-10-CM

## 2022-05-10 DIAGNOSIS — M25551 Pain in right hip: Secondary | ICD-10-CM | POA: Diagnosis not present

## 2022-05-10 DIAGNOSIS — M25511 Pain in right shoulder: Secondary | ICD-10-CM | POA: Diagnosis not present

## 2022-05-10 DIAGNOSIS — G894 Chronic pain syndrome: Secondary | ICD-10-CM

## 2022-05-10 DIAGNOSIS — M546 Pain in thoracic spine: Secondary | ICD-10-CM

## 2022-05-10 DIAGNOSIS — M25562 Pain in left knee: Secondary | ICD-10-CM

## 2022-05-10 DIAGNOSIS — M25561 Pain in right knee: Secondary | ICD-10-CM | POA: Diagnosis not present

## 2022-05-10 DIAGNOSIS — G8929 Other chronic pain: Secondary | ICD-10-CM

## 2022-05-12 ENCOUNTER — Ambulatory Visit: Payer: Medicaid Other | Admitting: Internal Medicine

## 2022-05-12 ENCOUNTER — Encounter: Payer: Self-pay | Admitting: Internal Medicine

## 2022-05-12 VITALS — BP 124/76 | HR 79 | Ht 62.0 in | Wt 197.0 lb

## 2022-05-12 DIAGNOSIS — Z6836 Body mass index (BMI) 36.0-36.9, adult: Secondary | ICD-10-CM

## 2022-05-12 DIAGNOSIS — F331 Major depressive disorder, recurrent, moderate: Secondary | ICD-10-CM | POA: Diagnosis not present

## 2022-05-12 DIAGNOSIS — I7300 Raynaud's syndrome without gangrene: Secondary | ICD-10-CM

## 2022-05-12 NOTE — Progress Notes (Signed)
  Date:  05/12/2022   Name:  Christine Arellano   DOB:  04/19/1991   MRN:  [id number]   Chief Complaint: Weight Loss (Has tried phentermine ) She is gaining weight despite diet changes.  She has tried WW but it has not helped.  She is currently without a job, living with her mother and raising her three children.  She can not afford weight loss medication - phentermine was not effective.  She does not think she can afford NOOM. She has medicaid insurance so most medications are not going to be covered. HPI Cold fingers and toes - she has cold digits, worse in the winter.  They often turn slightly blue but never white or black.  She is concerned that she has poor circulation.  Lab Results  Component Value Date   NA 133 (L) 03/27/2022   K 4.0 03/27/2022   CO2 23 03/27/2022   GLUCOSE 82 03/27/2022   BUN 14 03/27/2022   CREATININE 0.58 03/27/2022   CALCIUM 9.0 03/27/2022   EGFR 106 10/31/2021   GFRNONAA >60 03/27/2022   Lab Results  Component Value Date   CHOL 259 (H) 06/29/2021   HDL 87 06/29/2021   LDLCALC 141 (H) 06/29/2021   TRIG 179 (H) 06/29/2021   CHOLHDL 3.0 06/29/2021   Lab Results  Component Value Date   TSH 1.920 06/29/2021   No results found for: "HGBA1C" Lab Results  Component Value Date   WBC 9.2 10/31/2021   HGB 12.9 10/31/2021   HCT 37.3 10/31/2021   MCV 87 10/31/2021   PLT 379 10/31/2021   Lab Results  Component Value Date   ALT 11 03/27/2022   AST 17 03/27/2022   ALKPHOS 48 03/27/2022   BILITOT 0.4 03/27/2022   Lab Results  Component Value Date   VD25OH 61.38 03/27/2022     Review of Systems  Constitutional:  Positive for unexpected weight change. Negative for diaphoresis, fatigue and fever.  Respiratory:  Negative for chest tightness and shortness of breath.   Cardiovascular:  Negative for chest pain and leg swelling.  Psychiatric/Behavioral:  Positive for dysphoric mood. Negative for sleep disturbance. The patient is nervous/anxious.      Patient Active Problem List   Diagnosis Date Noted   Lumbar facet syndrome (Bilateral) (R>L) 04/26/2022    Class: Chronic   Chronic pain syndrome 03/27/2022   Pharmacologic therapy 03/27/2022   Disorder of skeletal system 03/27/2022   Problems influencing health status 03/27/2022   Chronic knee pain (3ry area of Pain) (Bilateral) (R>L) 03/27/2022   Chronic upper back pain (4th area of Pain) (Midline) (Bilateral) 03/27/2022   Chronic shoulder-blade pain (Bilateral) 03/27/2022   Chronic shoulder pain (5th area of Pain) (Bilateral) (R>L) 03/27/2022   Chronic hip pain (6th area of Pain) (Bilateral) (R>L) 03/27/2022   Stiffness of right knee 11/16/2021   Fibromyalgia syndrome (7th area of Pain) 09/22/2021   Polyarthralgia 09/22/2021   Chronic sacroiliac joint pain (Bilateral) (R>L) 09/14/2021    Class: Chronic   B12 nutritional deficiency 08/30/2021   Lumbosacral spondylosis with radiculopathy 08/03/2021   Chronic low back pain (1ry area of Pain) (Bilateral) (R>L) w/o sciatica 03/30/2021   BMI 33.0-33.9,adult 03/30/2021   Migraine with aura (8th area of Pain) 01/26/2021   Inverted nipple 05/05/2019   Moderate episode of recurrent major depressive disorder (HCC) 05/05/2019   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   Generalized anxiety disorder 03/04/2019   Alopecia of scalp 03/04/2019   Dry eye syndrome of both eyes 10/18/2017     Chronic lower extremity pain (2ry area of Pain) (Bilateral) (R>L) 07/06/2016   Adult acne 05/03/2015    No Known Allergies  Past Surgical History:  Procedure Laterality Date   arthroscopic knee Right 2008   ARTHROSCOPIC REPAIR PCL Right 10/14/2021   CESAREAN SECTION  2014   CESAREAN SECTION N/A 02/24/2015   Procedure: CESAREAN SECTION;  Surgeon: Rubie Maid, MD;  Location: [facility];  Service: Obstetrics;  Laterality: N/A;   CESAREAN SECTION N/A 05/09/2017   Procedure: REPEAT CESAREAN SECTION;  Surgeon: Rubie Maid, MD;  Location: [facility];  Service:  Obstetrics;  Laterality: N/A;   HEMORROIDECTOMY     NASAL ENDOSCOPY Bilateral 01/05/2022   Procedure: VIVAER NASAL VALVE REPAIR;  Surgeon: Margaretha Sheffield, MD;  Location: [place];  Service: ENT;  Laterality: Bilateral;   NASAL TURBINATE REDUCTION Bilateral 01/05/2022   Procedure: INFERIOR TURBINATE REDUCTION;  Surgeon: Margaretha Sheffield, MD;  Location: [place];  Service: ENT;  Laterality: Bilateral;   SEPTOPLASTY N/A 01/05/2022   Procedure: SEPTOPLASTY;  Surgeon: Margaretha Sheffield, MD;  Location: [place];  Service: ENT;  Laterality: N/A;   THERAPEUTIC ABORTION  08/2018   fetal CNS abnormalities   TONSILLECTOMY     WISDOM TOOTH EXTRACTION      Social History   Tobacco Use   Smoking status: Former    Packs/day: 1.00    Types: Cigarettes    Quit date: 08/27/2009    Years since quitting: 12.7   Smokeless tobacco: Never  Vaping Use   Vaping Use: Never used  Substance Use Topics   Alcohol use: Not Currently    Alcohol/week: 0.0 standard drinks of alcohol   Drug use: No     Medication list has been reviewed and updated.  Current Meds  Medication Sig   celecoxib (CELEBREX) 200 MG capsule ONE TO 2 CAPS BY MOUTH DAILY AS NEEDED FOR PAIN.   cyanocobalamin (VITAMIN B12) 1000 MCG tablet TAKE 1 TABLET BY MOUTH EVERY DAY   FLUoxetine HCl 60 MG TABS fluoxetine 60 mg tablet  TAKE 1 TABLET BY MOUTH EVERY DAY IN THE MORNING   fluticasone (FLONASE) 50 MCG/ACT nasal spray SPRAY 2 SPRAYS INTO EACH NOSTRIL EVERY DAY   hydrOXYzine (VISTARIL) 50 MG capsule SMARTSIG:1 Capsule(s) By Mouth 1-2 Times Daily PRN   Lifitegrast (XIIDRA) 5 % SOLN PLACE ONE IN BOTH EYES TWICE A DAY   minoxidil (LONITEN) 2.5 MG tablet Take 2.5 mg by mouth daily.   norgestimate-ethinyl estradiol (ORTHO-CYCLEN) 0.25-35 MG-MCG tablet TAKE 1 TABLET BY MOUTH EVERY DAY   nystatin ointment (MYCOSTATIN) APPLY TO AFFECTED AREA TWICE A DAY   polyethylene glycol powder (GLYCOLAX/MIRALAX) 17 GM/SCOOP powder Take  17 g by mouth 2 (two) times daily as needed.   pregabalin (LYRICA) 25 MG capsule Take 25 mg by mouth 2 (two) times daily.   propranolol (INDERAL) 10 MG tablet Take 10 mg by mouth 2 (two) times daily.   spironolactone (ALDACTONE) 100 MG tablet Take 200 mg by mouth daily.   traZODone (DESYREL) 100 MG tablet    Vitamin D, Ergocalciferol, (DRISDOL) 1.25 MG (50000 UNIT) CAPS capsule Take 50,000 Units by mouth once a week.   VYVANSE 50 MG capsule Take 50 mg by mouth every morning.       05/12/2022   11:21 AM 04/04/2022    4:19 PM 10/31/2021   11:11 AM 09/14/2021    9:26 AM  GAD 7 : Generalized Anxiety Score  Nervous, Anxious, on Edge _0 Control/stop  worrying 3 3 3 1  Worry too much - different things 3 3 3 2  Trouble relaxing 3 2 1 1  Restless 2 2 1 0  Easily annoyed or irritable 2 2 2 2  Afraid - awful might happen 1 1 2 1  Total GAD 7 Score 17 16 14 9  Anxiety Difficulty Extremely difficult Very difficult Very difficult Somewhat difficult       05/12/2022   11:21 AM 04/04/2022    4:19 PM 03/27/2022    9:08 AM  Depression screen PHQ 2/9  Decreased Interest 3 3 0  Down, Depressed, Hopeless 2 2 0  PHQ - 2 Score 5 5 0  Altered sleeping 3 2   Tired, decreased energy 3 3   Change in appetite 3 3   Feeling bad or failure about yourself  1 3   Trouble concentrating 1 2   Moving slowly or fidgety/restless 2 1   Suicidal thoughts 0 0   PHQ-9 Score 18 19   Difficult doing work/chores Extremely dIfficult Very difficult     BP Readings from Last 3 Encounters:  05/12/22 124/76  04/26/22 124/82  04/04/22 108/68    Physical Exam Vitals and nursing note reviewed.  Constitutional:      General: She is not in acute distress.    Appearance: Normal appearance. She is well-developed.  HENT:     Head: Normocephalic and atraumatic.  Cardiovascular:     Rate and Rhythm: Normal rate and regular rhythm.     Pulses: Normal pulses.          Dorsalis pedis pulses are 2+ on the right  side and 2+ on the left side.       Posterior tibial pulses are 2+ on the right side and 2+ on the left side.     Heart sounds: No murmur heard. Pulmonary:     Effort: Pulmonary effort is normal. No respiratory distress.     Breath sounds: No wheezing or rhonchi.  Musculoskeletal:     Cervical back: Normal range of motion.     Right lower leg: No edema.     Left lower leg: No edema.  Lymphadenopathy:     Cervical: No cervical adenopathy.  Skin:    General: Skin is warm and dry.     Findings: No rash.  Neurological:     Mental Status: She is alert and oriented to person, place, and time.  Psychiatric:        Mood and Affect: Mood normal.        Behavior: Behavior normal.     Wt Readings from Last 3 Encounters:  05/12/22 197 lb (89.4 kg)  04/26/22 188 lb (85.3 kg)  04/04/22 188 lb (85.3 kg)    BP 124/76   Pulse 79   Ht 5' 2" (1.575 m)   Wt 197 lb (89.4 kg)   SpO2 98%   BMI 36.03 kg/m   Assessment and Plan: 1. Raynaud's disease without gangrene Suspect this is Raynaud's.  Recommend wearing socks and gloves when outside during the fall and winter. Monitor for worsening.  2. BMI 36.0-36.9,adult Unfortunately she is not in a financial situation to afford weight loss medications. She does not have an co-morbidities that would qualify her for gastric sleeve. I recommend that she continue portion control but try to eat a balanced diet with whole grains/lean meat/fruits and vegetables.  3. Moderate episode of recurrent major depressive disorder (HCC) Followed by Psych   Partially dictated   using Dragon software. Any errors are unintentional.   , MD [facility]  05/12/2022      

## 2022-05-23 ENCOUNTER — Ambulatory Visit
Admission: RE | Admit: 2022-05-23 | Discharge: 2022-05-23 | Disposition: A | Discharge status: Home / Self Care | Payer: Medicaid Other | Source: Ambulatory Visit | Attending: Pain Medicine | Admitting: Pain Medicine

## 2022-05-23 DIAGNOSIS — M545 Low back pain, unspecified: Secondary | ICD-10-CM | POA: Insufficient documentation

## 2022-05-23 DIAGNOSIS — M25551 Pain in right hip: Secondary | ICD-10-CM | POA: Insufficient documentation

## 2022-05-23 DIAGNOSIS — M25561 Pain in right knee: Secondary | ICD-10-CM | POA: Diagnosis not present

## 2022-05-23 DIAGNOSIS — M79605 Pain in left leg: Secondary | ICD-10-CM | POA: Insufficient documentation

## 2022-05-23 DIAGNOSIS — M25552 Pain in left hip: Secondary | ICD-10-CM | POA: Insufficient documentation

## 2022-05-23 DIAGNOSIS — G8929 Other chronic pain: Secondary | ICD-10-CM | POA: Insufficient documentation

## 2022-05-23 DIAGNOSIS — M5416 Radiculopathy, lumbar region: Secondary | ICD-10-CM | POA: Diagnosis not present

## 2022-05-23 DIAGNOSIS — M25562 Pain in left knee: Secondary | ICD-10-CM | POA: Diagnosis not present

## 2022-05-23 DIAGNOSIS — M79604 Pain in right leg: Secondary | ICD-10-CM | POA: Diagnosis not present

## 2022-06-01 ENCOUNTER — Other Ambulatory Visit: Payer: Self-pay | Admitting: Internal Medicine

## 2022-06-01 DIAGNOSIS — M797 Fibromyalgia: Secondary | ICD-10-CM | POA: Diagnosis not present

## 2022-06-01 DIAGNOSIS — E538 Deficiency of other specified B group vitamins: Secondary | ICD-10-CM

## 2022-06-05 DIAGNOSIS — L648 Other androgenic alopecia: Secondary | ICD-10-CM | POA: Diagnosis not present

## 2022-06-05 DIAGNOSIS — Z79899 Other long term (current) drug therapy: Secondary | ICD-10-CM | POA: Diagnosis not present

## 2022-06-07 DIAGNOSIS — Z79899 Other long term (current) drug therapy: Secondary | ICD-10-CM | POA: Diagnosis not present

## 2022-06-12 NOTE — Patient Instructions (Signed)
  ____________________________________________________________________________________________  Patient Information update  To: All of our patients.  Re: Name change.  It has been made official that our current name, "[facility]"   will soon be changed to "[facility]".   The purpose of this change is to eliminate any confusion created by the concept of our practice being a "Medication Management Pain Clinic". In the past this has led to the misconception that we treat pain primarily by the use of prescription medications.  Nothing can be farther from the truth.   Understanding PAIN MANAGEMENT: To further understand what our practice does, you first have to understand that "Pain Management" is a subspecialty that requires additional training once a physician has completed their specialty training, which can be in either Anesthesia, Neurology, Psychiatry, or Physical Medicine and Rehabilitation (PMR). Each one of these contributes to the final approach taken by each physician to the management of their patient's pain. To be a "Pain Management Specialist" you must have first completed one of the specialty trainings below.  Anesthesiologists - trained in clinical pharmacology and interventional techniques such as nerve blockade and regional as well as central neuroanatomy. They are trained to block pain before, during, and after surgical interventions.  Neurologists - trained in the diagnosis and pharmacological treatment of complex neurological conditions, such as Multiple Sclerosis, Parkinson's, spinal cord injuries, and other systemic conditions that may be associated with symptoms that may include but are not limited to pain. They tend to rely primarily on the treatment of chronic pain using prescription medications.  Psychiatrist - trained in conditions affecting the psychosocial  wellbeing of patients including but not limited to depression, anxiety, schizophrenia, personality disorders, addiction, and other substance use disorders that may be associated with chronic pain. They tend to rely primarily on the treatment of chronic pain using prescription medications.   Physical Medicine and Rehabilitation (PMR) physicians, also known as physiatrists - trained to treat a wide variety of medical conditions affecting the brain, spinal cord, nerves, bones, joints, ligaments, muscles, and tendons. Their training is primarily aimed at treating patients that have suffered injuries that have caused severe physical impairment. Their training is primarily aimed at the physical therapy and rehabilitation of those patients. They may also work alongside orthopedic surgeons or neurosurgeons using their expertise in assisting surgical patients to recover after their surgeries.  INTERVENTIONAL PAIN MANAGEMENT is sub-subspecialty of Pain Management.  Our physicians are Board-certified in Anesthesia, Pain Management, and Interventional Pain Management.  This meaning that not only have they been trained and Board-certified in their specialty of Anesthesia, and subspecialty of Pain Management, but they have also received further training in the sub-subspecialty of Interventional Pain Management, in order to become Board-certified as INTERVENTIONAL PAIN MANAGEMENT SPECIALIST.    Mission: Our goal is to use our skills in  INTERVENTIONAL PAIN MANAGEMENT as alternatives to the chronic use of prescription opioid medications for the treatment of pain. To make this more clear, we have changed our name to reflect what we do and offer. We will continue to offer medication management assessment and recommendations, but we will not be taking over any patient's medication management.  ____________________________________________________________________________________________     

## 2022-06-14 ENCOUNTER — Telehealth: Payer: Self-pay

## 2022-06-14 NOTE — Telephone Encounter (Signed)
LM for patient to call office for pre virtual appointmenjt questions.

## 2022-06-15 ENCOUNTER — Ambulatory Visit: Payer: Medicaid Other | Attending: Pain Medicine | Admitting: Pain Medicine

## 2022-06-15 DIAGNOSIS — M25511 Pain in right shoulder: Secondary | ICD-10-CM

## 2022-06-15 DIAGNOSIS — M25552 Pain in left hip: Secondary | ICD-10-CM

## 2022-06-15 DIAGNOSIS — M797 Fibromyalgia: Secondary | ICD-10-CM | POA: Diagnosis not present

## 2022-06-15 DIAGNOSIS — G8929 Other chronic pain: Secondary | ICD-10-CM

## 2022-06-15 DIAGNOSIS — M25562 Pain in left knee: Secondary | ICD-10-CM | POA: Diagnosis not present

## 2022-06-15 DIAGNOSIS — R937 Abnormal findings on diagnostic imaging of other parts of musculoskeletal system: Secondary | ICD-10-CM

## 2022-06-15 DIAGNOSIS — M79605 Pain in left leg: Secondary | ICD-10-CM | POA: Diagnosis not present

## 2022-06-15 DIAGNOSIS — M25551 Pain in right hip: Secondary | ICD-10-CM

## 2022-06-15 DIAGNOSIS — M79604 Pain in right leg: Secondary | ICD-10-CM

## 2022-06-15 DIAGNOSIS — M549 Dorsalgia, unspecified: Secondary | ICD-10-CM

## 2022-06-15 DIAGNOSIS — M545 Low back pain, unspecified: Secondary | ICD-10-CM | POA: Diagnosis not present

## 2022-06-15 DIAGNOSIS — M2550 Pain in unspecified joint: Secondary | ICD-10-CM | POA: Diagnosis not present

## 2022-06-15 DIAGNOSIS — M25512 Pain in left shoulder: Secondary | ICD-10-CM

## 2022-06-15 DIAGNOSIS — M25561 Pain in right knee: Secondary | ICD-10-CM | POA: Diagnosis not present

## 2022-06-15 DIAGNOSIS — M7918 Myalgia, other site: Secondary | ICD-10-CM | POA: Insufficient documentation

## 2022-06-15 NOTE — Progress Notes (Signed)
Patient: Christine Arellano  Service Category: E/M  Provider: Gaspar Cola, MD  DOB: [birth year]  DOS: 06/15/2022  Location: Office  MRN: [id number]  Setting: Ambulatory outpatient  Referring Provider: Glean Hess, MD  Type: Established Patient  Specialty: Interventional Pain Management  PCP: Glean Hess, MD  Location: Remote location  Delivery: TeleHealth     Virtual Encounter - Pain Management PROVIDER NOTE: Information contained herein reflects review and annotations entered in association with encounter. Interpretation of such information and data should be left to medically-trained personnel. Information provided to patient can be located elsewhere in the medical record under "Patient Instructions". Document created using STT-dictation technology, any transcriptional errors that may result from process are unintentional.    Contact & Pharmacy Preferred: [phone] Home: [phone] (home) Mobile: [phone] (mobile) E-mail: [email or web address]  [facility], [place] [address] Phone: [phone] Fax: [phone]  [id number] [id number]: [phone]: [phone]  Pre-screening  [name] offered "in-person" vs "virtual" encounter. She indicated preferring virtual for this encounter.   Reason COVID-19*  Social distancing based on CDC and AMA recommendations.   I contacted LCLESSIE Arellano 06/15/2022 via telephone.      I clearly identified myself as FGaspar Cola MD. I verified that I was speaking with the correct person using two identifiers (Name: Christine Arellano and date of birth: [birth year].  Consent I sought verbal advanced consent from Christine Dienesfor virtual visit interactions. I informed [name] of possible security and privacy concerns, risks, and limitations associated with providing  "not-in-person" medical evaluation and management services. I also informed [name] of the availability of "in-person" appointments. Finally, I informed her that there would be a charge for the virtual visit and that she could be  personally, fully or partially, financially responsible for it. [name] understanding and agreed to proceed.   Historic Elements   [name] a 32y.o. year old, female patient evaluated today after our last contact on 05/10/2022. [name] has a past medical history of Acute midline thoracic back pain (05/05/2019), Anxiety, Depression, GERD (gastroesophageal reflux disease), Headache, History of IBS, Insomnia, Knee pain (07/06/2016), Lumbar back pain, Migraine with aura, Mood disorder ([place] (03/21/2016), normal karyotype after Abnormal chromosomal and genetic finding on antenatal screening mother, and S/P cesarean section (02/24/2015). She also  has a past surgical history that includes Hemorroidectomy; Tonsillectomy; arthroscopic knee (Right, 2008); Wisdom tooth extraction; Cesarean section (2014); Cesarean section (N/A, 02/24/2015); Cesarean section (N/A, 05/09/2017); Therapeutic abortion (08/2018); Arthroscopic repair PCL (Right, 10/14/2021); Septoplasty (N/A, 01/05/2022); Nasal turbinate reduction (Bilateral, 01/05/2022); and Nasal endoscopy (Bilateral, 01/05/2022). Ms. RHynemanhas a current medication list which includes the following prescription(s): celecoxib, [facility] vitamin b12, fluoxetine hcl, fluticasone, hydroxyzine, xiidra, minoxidil, norgestimate-ethinyl estradiol, nystatin ointment, polyethylene glycol powder, pregabalin, propranolol, spironolactone, trazodone, vitamin d (ergocalciferol), and vyvanse. She  reports that she quit smoking about 12 years ago. Her smoking use included cigarettes. She smoked an average of 1 pack per day. She has never used smokeless tobacco. She reports that she does not currently use alcohol. She reports that she does  not use drugs. Ms. RClareyhas No Known Allergies.  Estimated body mass index is 36.03 kg/m as calculated from the following:   Height as of 05/12/22: '5\' 2"'$  (1.575 m).  Weight as of 05/12/22: 197 lb (89.4 kg).  HPI  Today, she is being contacted for follow-up evaluation after MRI.  (05/24/2022) LUMBAR MRI FINDINGS: Vertebrae: Diffuse loss of normal bone marrow signal, nonspecific but can be seen with anemia, smoking, obesity, and infiltrative/myelofibrotic marrow processes.  Paraspinal and other soft tissues: Incidental note made of a 2.2 cm simple cyst within the right adnexa. This is almost certainly benign given size and patient age.  DISC LEVELS: No significant disc pathology seen within the lumbar spine. Intervertebral discs are well hydrated with preserved disc height. No disc bulge or focal disc herniation. No significant facet disease. No canal or neural foraminal stenosis or evidence for neural impingement.  IMPRESSION: Normal MRI of the lumbar spine. No significant disc pathology, stenosis, or evidence for neural impingement.  Today I went over the results of the MRI with the patient and I also took the time to review all of the other x-rays that we had ordered.  All of them have shown minimal changes consistent with osteoarthritis.  She describes her pain as being more generalized.  She does have a diagnosis of fibromyalgia syndrome as well as Raynaud's disease.  Based on my findings, I have recommended an exercise program that she should continue on her own for the rest of her life.  For the joint pain I have recommended the judicious use of NSAIDs and as long as she is not on any blood thinners or has any renal problems..  For the musculoskeletal pain she can use muscle relaxants as well as over-the-counter magnesium as long as she does not have any kidney problems.  I have also indicated to the patient that in addition to these 2 types of medication, she can also take some  acetaminophen, as long as she keeps it to less than 6/day and as long as she does not have any hepatic impairment or enzyme abnormality.  I have also informed the patient that should she begin to experience pain in a particular area, not just generalized, I would be more than happy to reevaluate for the possibility of interventional therapies.  The patient indicated having understood and accepted.  A referral to physical therapy and a referral for a TENS unit were sent today.  Pharmacotherapy Assessment   Opioid Analgesic: No chronic opioid analgesics therapy prescribed by our practice. None MME/day: 0 mg/day   Monitoring: [place] PMP: PDMP reviewed during this encounter.       Pharmacotherapy: No side-effects or adverse reactions reported. Compliance: No problems identified. Effectiveness: Clinically acceptable. Plan: Refer to "POC". UDS: No results found for: "SUMMARY" No results found for: "CBDTHCR", "D8THCCBX", "D9THCCBX"   Laboratory Chemistry Profile   Renal Lab Results  Component Value Date   BUN 14 03/27/2022   CREATININE 0.58 03/27/2022   BCR 18 10/31/2021   GFRAA 154 05/31/2018   GFRNONAA >60 03/27/2022    Hepatic Lab Results  Component Value Date   AST 17 03/27/2022   ALT 11 03/27/2022   ALBUMIN 3.9 03/27/2022   ALKPHOS 48 03/27/2022    Electrolytes Lab Results  Component Value Date   NA 133 (L) 03/27/2022   K 4.0 03/27/2022   CL 103 03/27/2022   CALCIUM 9.0 03/27/2022   MG 1.7 03/27/2022    Bone Lab Results  Component Value Date   VD25OH 61.38 03/27/2022    Inflammation (CRP: Acute Phase) (ESR: Chronic Phase) Lab Results  Component Value Date   CRP 0.6 03/27/2022   ESRSEDRATE 4 03/27/2022  Note: Above Lab results reviewed.  Imaging  MR LUMBAR SPINE WO CONTRAST CLINICAL DATA:  Initial evaluation for low back pain, radiculopathy.  EXAM: MRI LUMBAR SPINE WITHOUT CONTRAST  TECHNIQUE: Multiplanar, multisequence MR imaging of the lumbar spine  was performed. No intravenous contrast was administered.  COMPARISON:  Radiograph from 03/27/2022.  FINDINGS: Segmentation: Standard. Lowest well-formed disc space labeled the L5-S1 level.  Alignment: Physiologic with preservation of the normal lumbar lordosis. No listhesis.  Vertebrae: Vertebral body height maintained without acute or chronic fracture. Diffuse loss of normal bone marrow signal, nonspecific but can be seen with anemia, smoking, obesity, and infiltrative/myelofibrotic marrow processes. No discrete or worrisome osseous lesions. No abnormal marrow edema.  Conus medullaris and cauda equina: Conus extends to the T12-L1 level. Conus and cauda equina appear normal.  Paraspinal and other soft tissues: Paraspinous soft tissues within normal limits. Incidental note made of a 2.2 cm simple cyst within the right adnexa. This is almost certainly benign given size and patient age, with no follow-up imaging recommended. Note: This recommendation does not apply to premenarchal patients and to those with increased risk (genetic, family history, elevated tumor markers or other high-risk factors) of ovarian cancer. Reference: JACR 2020 Feb; 17(2):248-254  Disc levels:  No significant disc pathology seen within the lumbar spine. Intervertebral discs are well hydrated with preserved disc height. No disc bulge or focal disc herniation. No significant facet disease. No canal or neural foraminal stenosis or evidence for neural impingement.  IMPRESSION: Normal MRI of the lumbar spine. No significant disc pathology, stenosis, or evidence for neural impingement.  Electronically Signed   By: Jeannine Boga M.D.   On: 05/24/2022 04:41  Assessment  The primary encounter diagnosis was MRI, lumbar spine (05/24/2022). Diagnoses of Chronic low back pain (1ry area of Pain) (Bilateral) (R>L) w/o sciatica, Chronic lower extremity pain (2ry area of Pain) (Bilateral) (R>L), Chronic  knee pain (3ry area of Pain) (Bilateral) (R>L), Chronic upper back pain (4th area of Pain) (Midline) (Bilateral), Chronic shoulder pain (5th area of Pain) (Bilateral) (R>L), Chronic hip pain (6th area of Pain) (Bilateral) (R>L), Fibromyalgia syndrome (7th area of Pain), Polyarthralgia, and Chronic myofascial pain were also pertinent to this visit.  Plan of Care  Problem-specific:  No problem-specific Assessment & Plan notes found for this encounter.  Christine Arellano has a current medication list which includes the following long-term medication(s): fluticasone, norgestimate-ethinyl estradiol, pregabalin, propranolol, spironolactone, and trazodone.  Pharmacotherapy (Medications Ordered): No orders of the defined types were placed in this encounter.  Orders:  Orders Placed This Encounter  Procedures   Ambulatory referral to Physical Therapy    Referral Priority:   Routine    Referral Type:   Physical Medicine    Referral Reason:   Specialty Services Required    Requested Specialty:   Physical Therapy    Number of Visits Requested:   1   PT TENS evaluation    Scheduling Instructions:     Please set this patient up with a TENS unit to be used for acute and subacute flareups of herpain   Follow-up plan:   Return if symptoms worsen or fail to improve.     Interventional Therapies  Risk  Complexity Considerations:   Estimated body mass index is 32.92 kg/m as calculated from the following:   Height as of this encounter: '5\' 2"'$  (1.575 m).   Weight as of this encounter: 180 lb (81.6 kg). WNL   Planned  Pending:  Under consideration:   Pending completion of evaluation   Completed:   None at this time   Completed by other providers:   Diagnostic US-guided bilateral SI joint Blk x1 (09/14/2021) by Gwinda Maine ([facility]) [facility], worse x 2 wks)    Therapeutic  Palliative (PRN) options:   None established     Recent Visits Date Type Provider Dept  05/10/22 Office  Visit Milinda Pointer, MD [facility]  04/26/22 Office Visit Milinda Pointer, MD [facility]  03/27/22 Office Visit Milinda Pointer, MD [facility]  Showing recent visits within past 90 days and meeting all other requirements Today's Visits Date Type Provider Dept  06/15/22 Office Visit Milinda Pointer, MD [facility]  Showing today's visits and meeting all other requirements Future Appointments No visits were found meeting these conditions. Showing future appointments within next 90 days and meeting all other requirements  I discussed the assessment and treatment plan with the patient. The patient was provided an opportunity to ask questions and all were answered. The patient agreed with the plan and demonstrated an understanding of the instructions.  Patient advised to call back or seek an in-person evaluation if the symptoms or condition worsens.  Duration of encounter: 18 minutes.  Note by: Gaspar Cola, MD Date: 06/15/2022; Time: 12:51 PM

## 2022-06-30 ENCOUNTER — Ambulatory Visit (INDEPENDENT_AMBULATORY_CARE_PROVIDER_SITE_OTHER): Payer: Medicaid Other | Admitting: Internal Medicine

## 2022-06-30 ENCOUNTER — Encounter: Payer: Self-pay | Admitting: Internal Medicine

## 2022-06-30 VITALS — BP 106/68 | HR 93 | Ht 64.0 in | Wt 197.0 lb

## 2022-06-30 DIAGNOSIS — M4727 Other spondylosis with radiculopathy, lumbosacral region: Secondary | ICD-10-CM

## 2022-06-30 DIAGNOSIS — E782 Mixed hyperlipidemia: Secondary | ICD-10-CM | POA: Diagnosis not present

## 2022-06-30 DIAGNOSIS — F331 Major depressive disorder, recurrent, moderate: Secondary | ICD-10-CM

## 2022-06-30 DIAGNOSIS — Z308 Encounter for other contraceptive management: Secondary | ICD-10-CM

## 2022-06-30 DIAGNOSIS — E871 Hypo-osmolality and hyponatremia: Secondary | ICD-10-CM

## 2022-06-30 DIAGNOSIS — Z131 Encounter for screening for diabetes mellitus: Secondary | ICD-10-CM | POA: Diagnosis not present

## 2022-06-30 DIAGNOSIS — Z0000 Encounter for general adult medical examination without abnormal findings: Secondary | ICD-10-CM | POA: Diagnosis not present

## 2022-06-30 MED ORDER — NORGESTIMATE-ETH ESTRADIOL 0.25-35 MG-MCG PO TABS: 1.0000 | ORAL_TABLET | Freq: Every day | ORAL | 3 refills | Status: DC

## 2022-06-30 NOTE — Assessment & Plan Note (Signed)
She was seeing pain management but complete her evaluation Recommended PT which she can not afford for time and cost reasons

## 2022-06-30 NOTE — Assessment & Plan Note (Signed)
Followed by Psych - on Prozac and trazodone Doing fairly well - looking forward to starting a new job

## 2022-06-30 NOTE — Progress Notes (Signed)
Date:  06/30/2022   Name:  Christine Arellano   DOB:  [birth year]   MRN:  [id number]   Chief Complaint: No chief complaint on file. Christine Arellano is a 32 y.o. female who presents today for her Complete Annual Exam. She feels well. She reports exercising - none. She reports she is sleeping poorly. Breast complaints - none.  Menses are regular on ocps - 6 days moderate flow, no cramping or clots.  She is starting a job with [employer] in a few weeks.  Pap smear: 2/23  neg/neg   There are no preventive care reminders to display for this patient.   Immunization History  Administered Date(s) Administered   Influenza,inj,Quad PF,6+ Mos 02/05/2015, 06/06/2016, 03/04/2019, 03/01/2020   Tdap 12/03/2014, 02/22/2017    HPI  Lab Results  Component Value Date   NA 133 (L) 03/27/2022   K 4.0 03/27/2022   CO2 23 03/27/2022   GLUCOSE 82 03/27/2022   BUN 14 03/27/2022   CREATININE 0.58 03/27/2022   CALCIUM 9.0 03/27/2022   EGFR 106 10/31/2021   GFRNONAA >60 03/27/2022   Lab Results  Component Value Date   CHOL 259 (H) 06/29/2021   HDL 87 06/29/2021   LDLCALC 141 (H) 06/29/2021   TRIG 179 (H) 06/29/2021   CHOLHDL 3.0 06/29/2021   Lab Results  Component Value Date   TSH 1.920 06/29/2021   No results found for: "HGBA1C" Lab Results  Component Value Date   WBC 9.2 10/31/2021   HGB 12.9 10/31/2021   HCT 37.3 10/31/2021   MCV 87 10/31/2021   PLT 379 10/31/2021   Lab Results  Component Value Date   ALT 11 03/27/2022   AST 17 03/27/2022   ALKPHOS 48 03/27/2022   BILITOT 0.4 03/27/2022   Lab Results  Component Value Date   VD25OH 61.38 03/27/2022     Review of Systems  Constitutional:  Negative for chills, fatigue and fever.  HENT:  Negative for congestion, hearing loss, tinnitus, trouble swallowing and voice change.   Eyes:  Negative for visual disturbance.  Respiratory:  Negative for cough, chest tightness, shortness of breath and wheezing.   Cardiovascular:  Negative for  chest pain, palpitations and leg swelling.  Gastrointestinal:  Negative for abdominal pain, constipation, diarrhea and vomiting.  Endocrine: Negative for polydipsia and polyuria.  Genitourinary:  Negative for dysuria, frequency, genital sores, vaginal bleeding and vaginal discharge.  Musculoskeletal:  Positive for arthralgias and gait problem. Negative for joint swelling.  Skin:  Negative for color change and rash.  Neurological:  Negative for dizziness, tremors, light-headedness and headaches.  Hematological:  Negative for adenopathy. Does not bruise/bleed easily.  Psychiatric/Behavioral:  Positive for dysphoric mood. Negative for sleep disturbance. The patient is nervous/anxious.     Patient Active Problem List   Diagnosis Date Noted   Mixed hyperlipidemia 06/30/2022   MRI, lumbar spine (05/24/2022) 06/15/2022   Raynaud's disease without gangrene 05/12/2022   Lumbar facet syndrome (Bilateral) (R>L) 04/26/2022    Class: Chronic   Pharmacologic therapy 03/27/2022   Disorder of skeletal system 03/27/2022   Problems influencing health status 03/27/2022   Chronic knee pain (3ry area of Pain) (Bilateral) (R>L) 03/27/2022   Chronic upper back pain (4th area of Pain) (Midline) (Bilateral) 03/27/2022   Chronic shoulder pain (5th area of Pain) (Bilateral) (R>L) 03/27/2022   Chronic hip pain (6th area of Pain) (Bilateral) (R>L) 03/27/2022   Stiffness of right knee 11/16/2021   Fibromyalgia syndrome (7th area of Pain) 09/22/2021  Polyarthralgia 09/22/2021   Chronic sacroiliac joint pain (Bilateral) (R>L) 09/14/2021    Class: Chronic   B12 nutritional deficiency 08/30/2021   Lumbosacral spondylosis with radiculopathy 08/03/2021   Chronic low back pain (1ry area of Pain) (Bilateral) (R>L) w/o sciatica 03/30/2021   BMI 33.0-33.9,adult 03/30/2021   Migraine with aura (8th area of Pain) 01/26/2021   Inverted nipple 05/05/2019   Moderate episode of recurrent major depressive disorder ([place])  05/05/2019   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   Generalized anxiety disorder 03/04/2019   Alopecia of scalp 03/04/2019   Dry eye syndrome of both eyes 10/18/2017   Chronic lower extremity pain (2ry area of Pain) (Bilateral) (R>L) 07/06/2016   Adult acne 05/03/2015    No Known Allergies  Past Surgical History:  Procedure Laterality Date   arthroscopic knee Right 2008   ARTHROSCOPIC REPAIR PCL Right 10/14/2021   CESAREAN SECTION  2014   CESAREAN SECTION N/A 02/24/2015   Procedure: CESAREAN SECTION;  Surgeon: Rubie Maid, MD;  Location: [facility];  Service: Obstetrics;  Laterality: N/A;   CESAREAN SECTION N/A 05/09/2017   Procedure: REPEAT CESAREAN SECTION;  Surgeon: Rubie Maid, MD;  Location: [facility];  Service: Obstetrics;  Laterality: N/A;   HEMORROIDECTOMY     NASAL ENDOSCOPY Bilateral 01/05/2022   Procedure: VIVAER NASAL VALVE REPAIR;  Surgeon: Margaretha Sheffield, MD;  Location: [place];  Service: ENT;  Laterality: Bilateral;   NASAL TURBINATE REDUCTION Bilateral 01/05/2022   Procedure: INFERIOR TURBINATE REDUCTION;  Surgeon: Margaretha Sheffield, MD;  Location: [place];  Service: ENT;  Laterality: Bilateral;   SEPTOPLASTY N/A 01/05/2022   Procedure: SEPTOPLASTY;  Surgeon: Margaretha Sheffield, MD;  Location: [place];  Service: ENT;  Laterality: N/A;   THERAPEUTIC ABORTION  08/2018   fetal CNS abnormalities   TONSILLECTOMY     WISDOM TOOTH EXTRACTION      Social History   Tobacco Use   Smoking status: Former    Packs/day: 1.00    Types: Cigarettes    Quit date: 08/27/2009    Years since quitting: 12.8   Smokeless tobacco: Never  Vaping Use   Vaping Use: Never used  Substance Use Topics   Alcohol use: Not Currently    Alcohol/week: 0.0 standard drinks of alcohol   Drug use: No     Medication list has been reviewed and updated.  Current Meds  Medication Sig   celecoxib (CELEBREX) 200 MG capsule ONE TO 2 CAPS BY MOUTH DAILY AS NEEDED FOR PAIN.    cyanocobalamin ([employer] VITAMIN B12) 1000 MCG tablet TAKE 1 TABLET BY MOUTH EVERY DAY   FLUoxetine HCl 60 MG TABS fluoxetine 60 mg tablet  TAKE 1 TABLET BY MOUTH EVERY DAY IN THE MORNING   fluticasone (FLONASE) 50 MCG/ACT nasal spray SPRAY 2 SPRAYS INTO EACH NOSTRIL EVERY DAY   hydrOXYzine (VISTARIL) 50 MG capsule SMARTSIG:1 Capsule(s) By Mouth 1-2 Times Daily PRN   minoxidil (LONITEN) 2.5 MG tablet Take 2.5 mg by mouth daily.   nystatin ointment (MYCOSTATIN) APPLY TO AFFECTED AREA TWICE A DAY   polyethylene glycol powder (GLYCOLAX/MIRALAX) 17 GM/SCOOP powder Take 17 g by mouth 2 (two) times daily as needed.   propranolol (INDERAL) 10 MG tablet Take 10 mg by mouth 2 (two) times daily.   spironolactone (ALDACTONE) 100 MG tablet Take 200 mg by mouth daily.   traZODone (DESYREL) 100 MG tablet    Vitamin D, Ergocalciferol, (DRISDOL) 1.25 MG (50000 UNIT) CAPS capsule Take 50,000 Units by mouth once a week.   [  DISCONTINUED] norgestimate-ethinyl estradiol (ORTHO-CYCLEN) 0.25-35 MG-MCG tablet TAKE 1 TABLET BY MOUTH EVERY DAY       06/30/2022    9:14 AM 05/12/2022   11:21 AM 04/04/2022    4:19 PM 10/31/2021   11:11 AM  GAD 7 : Generalized Anxiety Score  Nervous, Anxious, on Edge '3 3 3 2  '$ Control/stop worrying '3 3 3 3  '$ Worry too much - different things '3 3 3 3  '$ Trouble relaxing '1 3 2 1  '$ Restless '1 2 2 1  '$ Easily annoyed or irritable '1 2 2 2  '$ Afraid - awful might happen '1 1 1 2  '$ Total GAD 7 Score '13 17 16 14  '$ Anxiety Difficulty Somewhat difficult Extremely difficult Very difficult Very difficult       06/30/2022    9:14 AM 05/12/2022   11:21 AM 04/04/2022    4:19 PM  Depression screen PHQ 2/9  Decreased Interest '2 3 3  '$ Down, Depressed, Hopeless '1 2 2  '$ PHQ - 2 Score '3 5 5  '$ Altered sleeping '3 3 2  '$ Tired, decreased energy '2 3 3  '$ Change in appetite '3 3 3  '$ Feeling bad or failure about yourself  '1 1 3  '$ Trouble concentrating '1 1 2  '$ Moving slowly or fidgety/restless '1 2 1  '$ Suicidal thoughts 0 0 0   PHQ-9 Score '14 18 19  '$ Difficult doing work/chores Very difficult Extremely dIfficult Very difficult    BP Readings from Last 3 Encounters:  06/30/22 106/68  05/12/22 124/76  04/26/22 124/82    Physical Exam Vitals and nursing note reviewed.  Constitutional:      General: She is not in acute distress.    Appearance: She is well-developed.  HENT:     Head: Normocephalic and atraumatic.     Right Ear: Tympanic membrane and ear canal normal.     Left Ear: Tympanic membrane and ear canal normal.     Nose:     Right Sinus: No maxillary sinus tenderness.     Left Sinus: No maxillary sinus tenderness.  Eyes:     General: No scleral icterus.       Right eye: No discharge.        Left eye: No discharge.     Conjunctiva/sclera: Conjunctivae normal.  Neck:     Thyroid: No thyromegaly.     Vascular: No carotid bruit.  Cardiovascular:     Rate and Rhythm: Normal rate and regular rhythm.     Pulses: Normal pulses.     Heart sounds: Normal heart sounds.  Pulmonary:     Effort: Pulmonary effort is normal. No respiratory distress.     Breath sounds: No wheezing.  Chest:  Breasts:    Right: Inverted nipple present. No mass, nipple discharge, skin change or tenderness.     Left: No mass, nipple discharge, skin change or tenderness.  Abdominal:     General: Bowel sounds are normal.     Palpations: Abdomen is soft.     Tenderness: There is no abdominal tenderness.  Musculoskeletal:     Cervical back: Normal range of motion. No erythema.     Right lower leg: No edema.     Left lower leg: No edema.  Lymphadenopathy:     Cervical: No cervical adenopathy.  Skin:    General: Skin is warm and dry.     Capillary Refill: Capillary refill takes less than 2 seconds.     Findings: No rash.  Neurological:  General: No focal deficit present.     Mental Status: She is alert and oriented to person, place, and time.     Cranial Nerves: No cranial nerve deficit.     Sensory: No sensory  deficit.     Deep Tendon Reflexes: Reflexes are normal and symmetric.  Psychiatric:        Attention and Perception: Attention normal.        Mood and Affect: Mood normal.     Wt Readings from Last 3 Encounters:  06/30/22 197 lb (89.4 kg)  05/12/22 197 lb (89.4 kg)  04/26/22 188 lb (85.3 kg)    BP 106/68   Pulse 93   Ht '5\' 4"'$  (1.626 m)   Wt 197 lb (89.4 kg)   SpO2 96%   BMI 33.81 kg/m   Assessment and Plan: Problem List Items Addressed This Visit       Nervous and Auditory   Lumbosacral spondylosis with radiculopathy - Primary (Chronic)    She was seeing pain management but complete her evaluation Recommended PT which she can not afford for time and cost reasons        Other   Mixed hyperlipidemia   Relevant Orders   Lipid panel   Moderate episode of recurrent major depressive disorder (HCC) (Chronic)    Followed by Psych - on Prozac and trazodone Doing fairly well - looking forward to starting a new job      Other Visit Diagnoses     Annual physical exam       continue efforts with healthy diet, exercise as able Declines STI testing other screening up to date   Relevant Orders   CBC with Differential/Platelet   Comprehensive metabolic panel   Lipid panel   TSH   Hemoglobin A1c   Screening for diabetes mellitus       Relevant Orders   Hemoglobin A1c   Hyponatremia       Relevant Orders   Comprehensive metabolic panel   Encounter for other contraceptive management       Relevant Medications   norgestimate-ethinyl estradiol (ORTHO-CYCLEN) 0.25-35 MG-MCG tablet        Partially dictated using Editor, commissioning. Any errors are unintentional.  [name], MD [facility]  06/30/2022

## 2022-07-01 LAB — COMPREHENSIVE METABOLIC PANEL
ALT: 9 IU/L (ref 0–32)
AST: 17 IU/L (ref 0–40)
Albumin/Globulin Ratio: 2 (ref 1.2–2.2)
Albumin: 4.7 g/dL (ref 3.9–4.9)
Alkaline Phosphatase: 66 IU/L (ref 44–121)
BUN/Creatinine Ratio: 10 (ref 9–23)
BUN: 8 mg/dL (ref 6–20)
Bilirubin Total: <0.2 mg/dL (ref 0.0–1.2)
CO2: 21 mmol/L (ref 20–29)
Calcium: 10.2 mg/dL (ref 8.7–10.2)
Chloride: 100 mmol/L (ref 96–106)
Creatinine, Ser: 0.77 mg/dL (ref 0.57–1.00)
Globulin, Total: 2.4 g/dL (ref 1.5–4.5)
Glucose: 78 mg/dL (ref 70–99)
Potassium: 5 mmol/L (ref 3.5–5.2)
Sodium: 138 mmol/L (ref 134–144)
Total Protein: 7.1 g/dL (ref 6.0–8.5)
eGFR: 104 mL/min/{1.73_m2} (ref >59)

## 2022-07-01 LAB — CBC WITH DIFFERENTIAL/PLATELET
Basophils Absolute: 0 10*3/uL (ref 0.0–0.2)
Basos: 1 %
EOS (ABSOLUTE): 0.1 10*3/uL (ref 0.0–0.4)
Eos: 1 %
Hematocrit: 39.1 % (ref 34.0–46.6)
Hemoglobin: 13.1 g/dL (ref 11.1–15.9)
Immature Grans (Abs): 0 10*3/uL (ref 0.0–0.1)
Immature Granulocytes: 0 %
Lymphocytes Absolute: 1.4 10*3/uL (ref 0.7–3.1)
Lymphs: 20 %
MCH: 29.2 pg (ref 26.6–33.0)
MCHC: 33.5 g/dL (ref 31.5–35.7)
MCV: 87 fL (ref 79–97)
Monocytes Absolute: 0.4 10*3/uL (ref 0.1–0.9)
Monocytes: 6 %
Neutrophils Absolute: 5.1 10*3/uL (ref 1.4–7.0)
Neutrophils: 72 %
Platelets: 334 10*3/uL (ref 150–450)
RBC: 4.49 x10E6/uL (ref 3.77–5.28)
RDW: 12.9 % (ref 11.7–15.4)
WBC: 7 10*3/uL (ref 3.4–10.8)

## 2022-07-01 LAB — TSH: TSH: 2.08 u[IU]/mL (ref 0.450–4.500)

## 2022-07-01 LAB — LIPID PANEL
Chol/HDL Ratio: 3 ratio (ref 0.0–4.4)
Cholesterol, Total: 254 mg/dL — ABNORMAL HIGH (ref 100–199)
HDL: 85 mg/dL (ref >39)
LDL Chol Calc (NIH): 131 mg/dL — ABNORMAL HIGH (ref 0–99)
Triglycerides: 219 mg/dL — ABNORMAL HIGH (ref 0–149)
VLDL Cholesterol Cal: 38 mg/dL (ref 5–40)

## 2022-07-01 LAB — HEMOGLOBIN A1C
Est. average glucose Bld gHb Est-mCnc: 94 mg/dL
Hgb A1c MFr Bld: 4.9 % (ref 4.8–5.6)

## 2022-07-21 ENCOUNTER — Other Ambulatory Visit: Payer: Self-pay | Admitting: Internal Medicine

## 2022-07-21 DIAGNOSIS — B354 Tinea corporis: Secondary | ICD-10-CM

## 2022-08-03 ENCOUNTER — Telehealth: Payer: Self-pay | Admitting: Internal Medicine

## 2022-08-03 ENCOUNTER — Encounter: Payer: Self-pay | Admitting: Internal Medicine

## 2022-08-03 ENCOUNTER — Ambulatory Visit (INDEPENDENT_AMBULATORY_CARE_PROVIDER_SITE_OTHER): Payer: BC Managed Care – PPO | Admitting: Internal Medicine

## 2022-08-03 VITALS — BP 110/70 | HR 90 | Ht 62.0 in | Wt 197.0 lb

## 2022-08-03 DIAGNOSIS — F331 Major depressive disorder, recurrent, moderate: Secondary | ICD-10-CM | POA: Diagnosis not present

## 2022-08-03 DIAGNOSIS — Z6836 Body mass index (BMI) 36.0-36.9, adult: Secondary | ICD-10-CM

## 2022-08-03 MED ORDER — SEMAGLUTIDE-WEIGHT MANAGEMENT 0.25 MG/0.5ML ~~LOC~~ SOAJ: 0.2500 mg | SUBCUTANEOUS | 0 refills | Status: DC

## 2022-08-03 NOTE — Telephone Encounter (Signed)
Patient informed these meds will not be covered unless she is a diabetic.  Christine Arellano

## 2022-08-03 NOTE — Telephone Encounter (Signed)
Copied from [place] [id number]. Topic: General - Other >> Aug 03, 2022  9:22 AM Oley Balm A wrote: Reason for CRM: Pt is calling back to let PCP know that her insurance will cover Ozempic and also Rybelsus. Please advise

## 2022-08-03 NOTE — Progress Notes (Signed)
Date:  08/03/2022   Name:  Christine Arellano   DOB:  1991/02/04   MRN:  [id number]   Chief Complaint: Obesity (Pt has new insurance wants to start injections for weight loss) Weight management - pt is here for weight management  Today's weight 197 lbs.  Current diet is regular portion control and current exercise routine is none.  HPI  Lab Results  Component Value Date   NA 138 06/30/2022   K 5.0 06/30/2022   CO2 21 06/30/2022   GLUCOSE 78 06/30/2022   BUN 8 06/30/2022   CREATININE 0.77 06/30/2022   CALCIUM 10.2 06/30/2022   EGFR 104 06/30/2022   GFRNONAA >60 03/27/2022   Lab Results  Component Value Date   CHOL 254 (H) 06/30/2022   HDL 85 06/30/2022   LDLCALC 131 (H) 06/30/2022   TRIG 219 (H) 06/30/2022   CHOLHDL 3.0 06/30/2022   Lab Results  Component Value Date   TSH 2.080 06/30/2022   Lab Results  Component Value Date   HGBA1C 4.9 06/30/2022   Lab Results  Component Value Date   WBC 7.0 06/30/2022   HGB 13.1 06/30/2022   HCT 39.1 06/30/2022   MCV 87 06/30/2022   PLT 334 06/30/2022   Lab Results  Component Value Date   ALT 9 06/30/2022   AST 17 06/30/2022   ALKPHOS 66 06/30/2022   BILITOT <0.2 06/30/2022   Lab Results  Component Value Date   VD25OH 61.38 03/27/2022     Review of Systems  Constitutional:  Negative for fatigue and unexpected weight change.  HENT:  Negative for nosebleeds.   Eyes:  Negative for visual disturbance.  Respiratory:  Negative for cough, chest tightness, shortness of breath and wheezing.   Cardiovascular:  Negative for chest pain, palpitations and leg swelling.  Gastrointestinal:  Negative for abdominal pain, constipation and diarrhea.  Musculoskeletal:  Positive for myalgias.  Neurological:  Negative for dizziness, weakness, light-headedness and headaches.  Psychiatric/Behavioral:  Positive for dysphoric mood. The patient is nervous/anxious.     Patient Active Problem List   Diagnosis Date Noted   Mixed hyperlipidemia  06/30/2022   MRI, lumbar spine (05/24/2022) 06/15/2022   Raynaud's disease without gangrene 05/12/2022   Lumbar facet syndrome (Bilateral) (R>L) 04/26/2022    Class: Chronic   Pharmacologic therapy 03/27/2022   Disorder of skeletal system 03/27/2022   Problems influencing health status 03/27/2022   Chronic knee pain (3ry area of Pain) (Bilateral) (R>L) 03/27/2022   Chronic upper back pain (4th area of Pain) (Midline) (Bilateral) 03/27/2022   Chronic shoulder pain (5th area of Pain) (Bilateral) (R>L) 03/27/2022   Chronic hip pain (6th area of Pain) (Bilateral) (R>L) 03/27/2022   Stiffness of right knee 11/16/2021   Fibromyalgia syndrome (7th area of Pain) 09/22/2021   Polyarthralgia 09/22/2021   Chronic sacroiliac joint pain (Bilateral) (R>L) 09/14/2021    Class: Chronic   B12 nutritional deficiency 08/30/2021   Lumbosacral spondylosis with radiculopathy 08/03/2021   Chronic low back pain (1ry area of Pain) (Bilateral) (R>L) w/o sciatica 03/30/2021   BMI 33.0-33.9,adult 03/30/2021   Migraine with aura (8th area of Pain) 01/26/2021   Inverted nipple 05/05/2019   Moderate episode of recurrent major depressive disorder ([place]) 05/05/2019   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   Generalized anxiety disorder 03/04/2019   Alopecia of scalp 03/04/2019   Dry eye syndrome of both eyes 10/18/2017   Chronic lower extremity pain (2ry area of Pain) (Bilateral) (R>L) 07/06/2016   Adult acne 05/03/2015  No Known Allergies  Past Surgical History:  Procedure Laterality Date   arthroscopic knee Right 2008   ARTHROSCOPIC REPAIR PCL Right 10/14/2021   CESAREAN SECTION  2014   CESAREAN SECTION N/A 02/24/2015   Procedure: CESAREAN SECTION;  Surgeon: Rubie Maid, MD;  Location: [facility];  Service: Obstetrics;  Laterality: N/A;   CESAREAN SECTION N/A 05/09/2017   Procedure: REPEAT CESAREAN SECTION;  Surgeon: Rubie Maid, MD;  Location: [facility];  Service: Obstetrics;  Laterality: N/A;   HEMORROIDECTOMY      NASAL ENDOSCOPY Bilateral 01/05/2022   Procedure: VIVAER NASAL VALVE REPAIR;  Surgeon: Margaretha Sheffield, MD;  Location: [place];  Service: ENT;  Laterality: Bilateral;   NASAL TURBINATE REDUCTION Bilateral 01/05/2022   Procedure: INFERIOR TURBINATE REDUCTION;  Surgeon: Margaretha Sheffield, MD;  Location: [place];  Service: ENT;  Laterality: Bilateral;   SEPTOPLASTY N/A 01/05/2022   Procedure: SEPTOPLASTY;  Surgeon: Margaretha Sheffield, MD;  Location: [place];  Service: ENT;  Laterality: N/A;   THERAPEUTIC ABORTION  08/2018   fetal CNS abnormalities   TONSILLECTOMY     WISDOM TOOTH EXTRACTION      Social History   Tobacco Use   Smoking status: Former    Packs/day: 1.00    Types: Cigarettes    Quit date: 08/27/2009    Years since quitting: 12.9   Smokeless tobacco: Never  Vaping Use   Vaping Use: Never used  Substance Use Topics   Alcohol use: Not Currently    Alcohol/week: 0.0 standard drinks of alcohol   Drug use: No     Medication list has been reviewed and updated.  Current Meds  Medication Sig   celecoxib (CELEBREX) 200 MG capsule ONE TO 2 CAPS BY MOUTH DAILY AS NEEDED FOR PAIN.   cyanocobalamin ([employer] VITAMIN B12) 1000 MCG tablet TAKE 1 TABLET BY MOUTH EVERY DAY   fluticasone (FLONASE) 50 MCG/ACT nasal spray SPRAY 2 SPRAYS INTO EACH NOSTRIL EVERY DAY   hydrOXYzine (VISTARIL) 50 MG capsule SMARTSIG:1 Capsule(s) By Mouth 1-2 Times Daily PRN   minoxidil (LONITEN) 2.5 MG tablet Take 2.5 mg by mouth daily.   norgestimate-ethinyl estradiol (ORTHO-CYCLEN) 0.25-35 MG-MCG tablet Take 1 tablet by mouth daily.   nystatin ointment (MYCOSTATIN) APPLY TO AFFECTED AREA TWICE A DAY   polyethylene glycol powder (GLYCOLAX/MIRALAX) 17 GM/SCOOP powder Take 17 g by mouth 2 (two) times daily as needed.   pregabalin (LYRICA) 75 MG capsule Take 75 mg by mouth 2 (two) times daily. Rheumatologist   Semaglutide-Weight Management 0.25 MG/0.5ML SOAJ Inject 0.25 mg into the skin  once a week.   spironolactone (ALDACTONE) 100 MG tablet Take 200 mg by mouth daily.   traZODone (DESYREL) 100 MG tablet    Vitamin D, Ergocalciferol, (DRISDOL) 1.25 MG (50000 UNIT) CAPS capsule Take 50,000 Units by mouth once a week.   [DISCONTINUED] FLUoxetine HCl 60 MG TABS fluoxetine 60 mg tablet  TAKE 1 TABLET BY MOUTH EVERY DAY IN THE MORNING   [DISCONTINUED] propranolol (INDERAL) 10 MG tablet Take 10 mg by mouth 2 (two) times daily.       08/03/2022    8:53 AM 06/30/2022    9:14 AM 05/12/2022   11:21 AM 04/04/2022    4:19 PM  GAD 7 : Generalized Anxiety Score  Nervous, Anxious, on Edge '2 3 3 3  '$ Control/stop worrying '1 3 3 3  '$ Worry too much - different things '1 3 3 3  '$ Trouble relaxing 0 '1 3 2  '$ Restless 1 1  2 2  Easily annoyed or irritable '1 1 2 2  '$ Afraid - awful might happen '1 1 1 1  '$ Total GAD 7 Score '7 13 17 16  '$ Anxiety Difficulty  Somewhat difficult Extremely difficult Very difficult       08/03/2022    8:52 AM 06/30/2022    9:14 AM 05/12/2022   11:21 AM  Depression screen PHQ 2/9  Decreased Interest '2 2 3  '$ Down, Depressed, Hopeless '1 1 2  '$ PHQ - 2 Score '3 3 5  '$ Altered sleeping '3 3 3  '$ Tired, decreased energy '2 2 3  '$ Change in appetite '3 3 3  '$ Feeling bad or failure about yourself  '1 1 1  '$ Trouble concentrating '1 1 1  '$ Moving slowly or fidgety/restless 0 1 2  Suicidal thoughts 0 0 0  PHQ-9 Score '13 14 18  '$ Difficult doing work/chores Somewhat difficult Very difficult Extremely dIfficult    BP Readings from Last 3 Encounters:  08/03/22 110/70  06/30/22 106/68  05/12/22 124/76    Physical Exam Vitals and nursing note reviewed.  Constitutional:      General: She is not in acute distress.    Appearance: Normal appearance. She is well-developed.  HENT:     Head: Normocephalic and atraumatic.  Cardiovascular:     Rate and Rhythm: Normal rate and regular rhythm.  Pulmonary:     Effort: Pulmonary effort is normal. No respiratory distress.     Breath sounds: No wheezing  or rhonchi.  Musculoskeletal:     Cervical back: Normal range of motion.     Right lower leg: No edema.     Left lower leg: No edema.  Skin:    General: Skin is warm and dry.     Findings: No rash.  Neurological:     Mental Status: She is alert and oriented to person, place, and time.  Psychiatric:        Mood and Affect: Mood normal.        Behavior: Behavior normal.     Wt Readings from Last 3 Encounters:  08/03/22 197 lb (89.4 kg)  06/30/22 197 lb (89.4 kg)  05/12/22 197 lb (89.4 kg)    BP 110/70   Pulse 90   Ht '5\' 2"'$  (1.575 m)   Wt 197 lb (89.4 kg)   LMP 07/19/2022   SpO2 99%   BMI 36.03 kg/m   Assessment and Plan: Problem List Items Addressed This Visit       Other   Moderate episode of recurrent major depressive disorder ([place]) - Primary (Chronic)    Clinically stable on current regimen with good control of symptoms, No SI or HI. No change in management at this time.       Other Visit Diagnoses     BMI 36.0-36.9,adult       sent in [facility] but pt got message that it is not covered she will call insurance to see if Saxenda or Zepbound are   Relevant Medications   Semaglutide-Weight Management 0.25 MG/0.5ML SOAJ        Partially dictated using Editor, commissioning. Any errors are unintentional.  [name], MD [facility]  08/03/2022

## 2022-08-03 NOTE — Assessment & Plan Note (Signed)
Clinically stable on current regimen with good control of symptoms, No SI or HI. No change in management at this time.

## 2022-08-09 IMAGING — CR DG LUMBAR SPINE COMPLETE 4+V
5 series · 5 of 5 positions shown · non-contrast
Comparison: None.

CLINICAL DATA: Mid to lower back pain.

EXAM:
LUMBAR SPINE - COMPLETE 4+ VIEW

[l-spine ap]
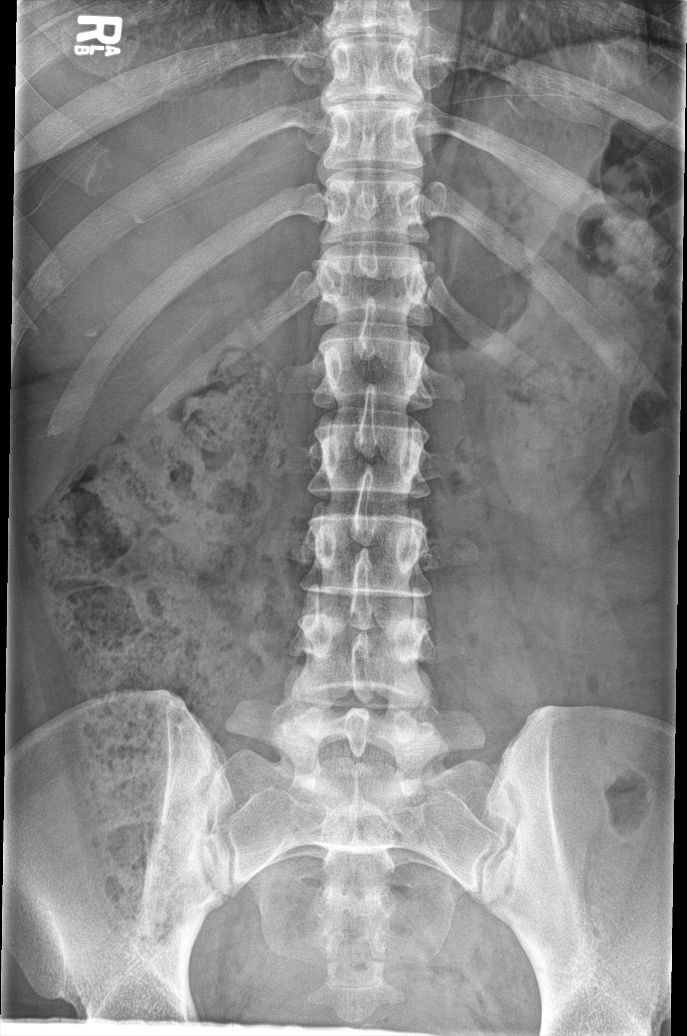

[l-spine obl (1 of 2)]
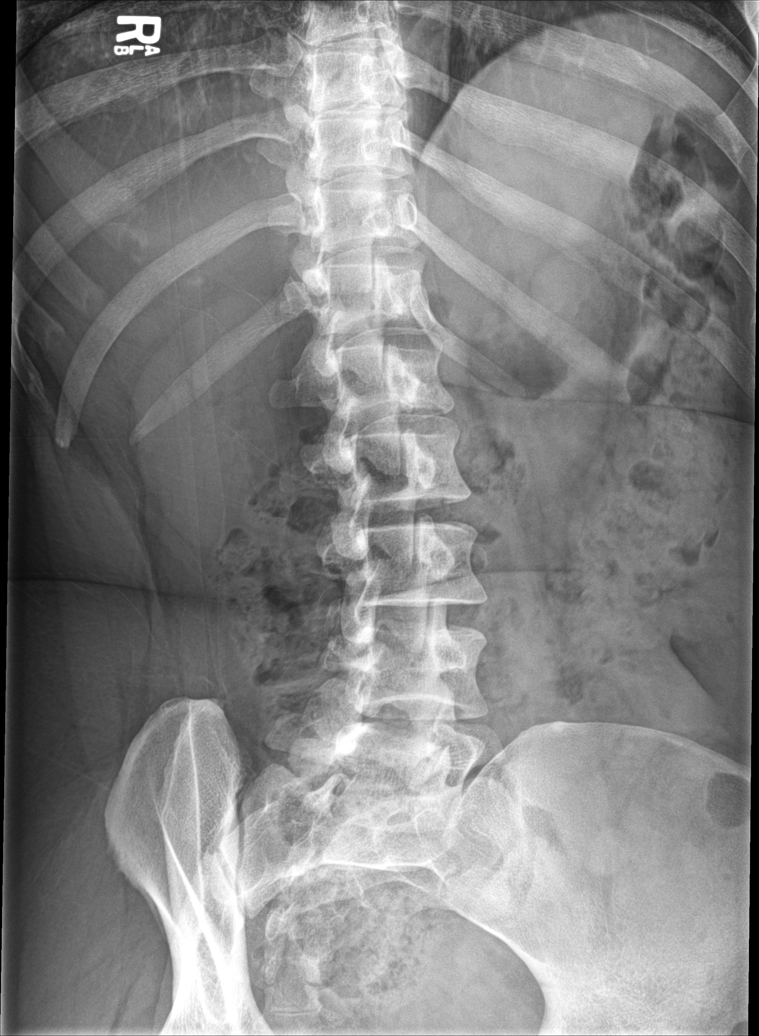

[l-spine obl (2 of 2)]
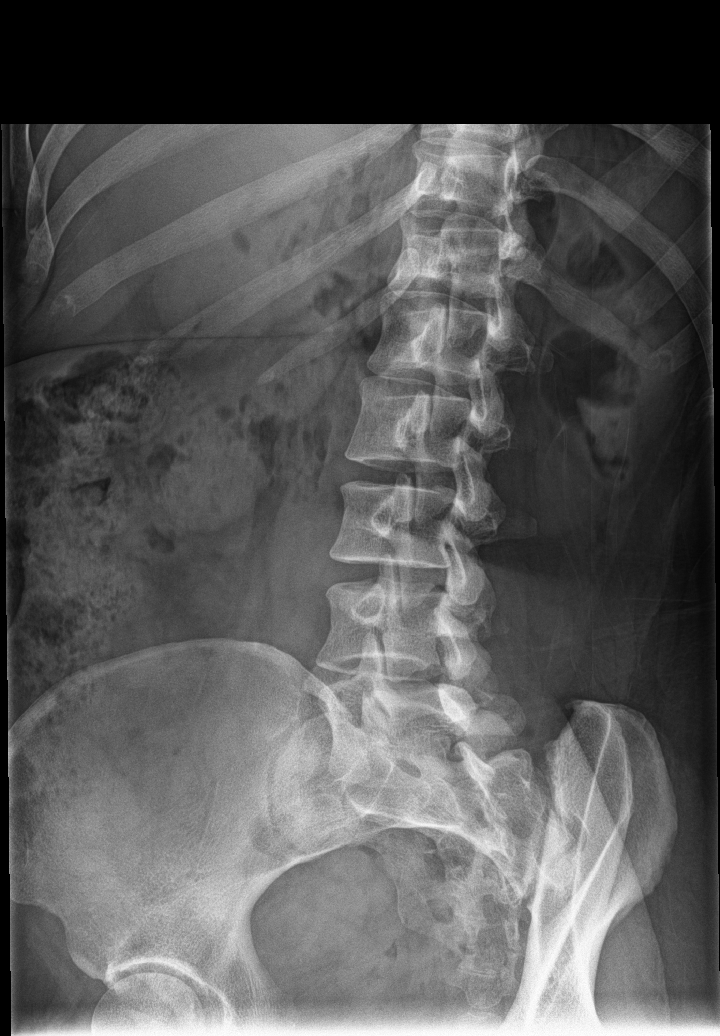

[l-spine lat]
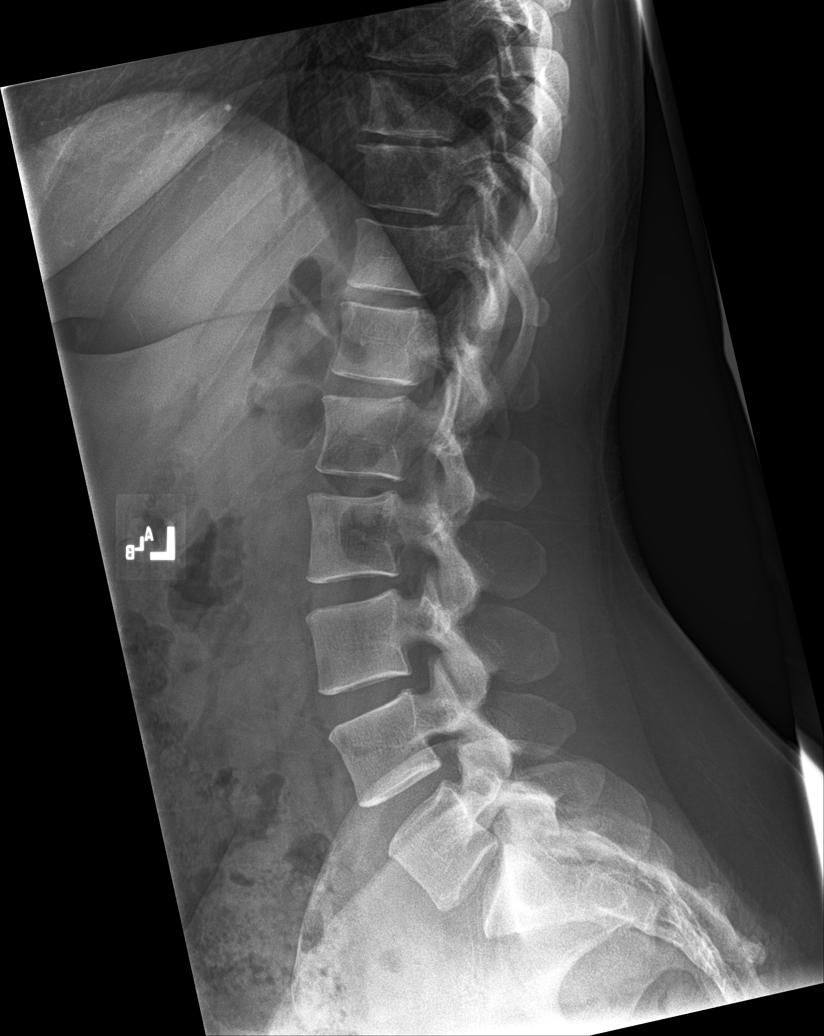

[l-spine spot]
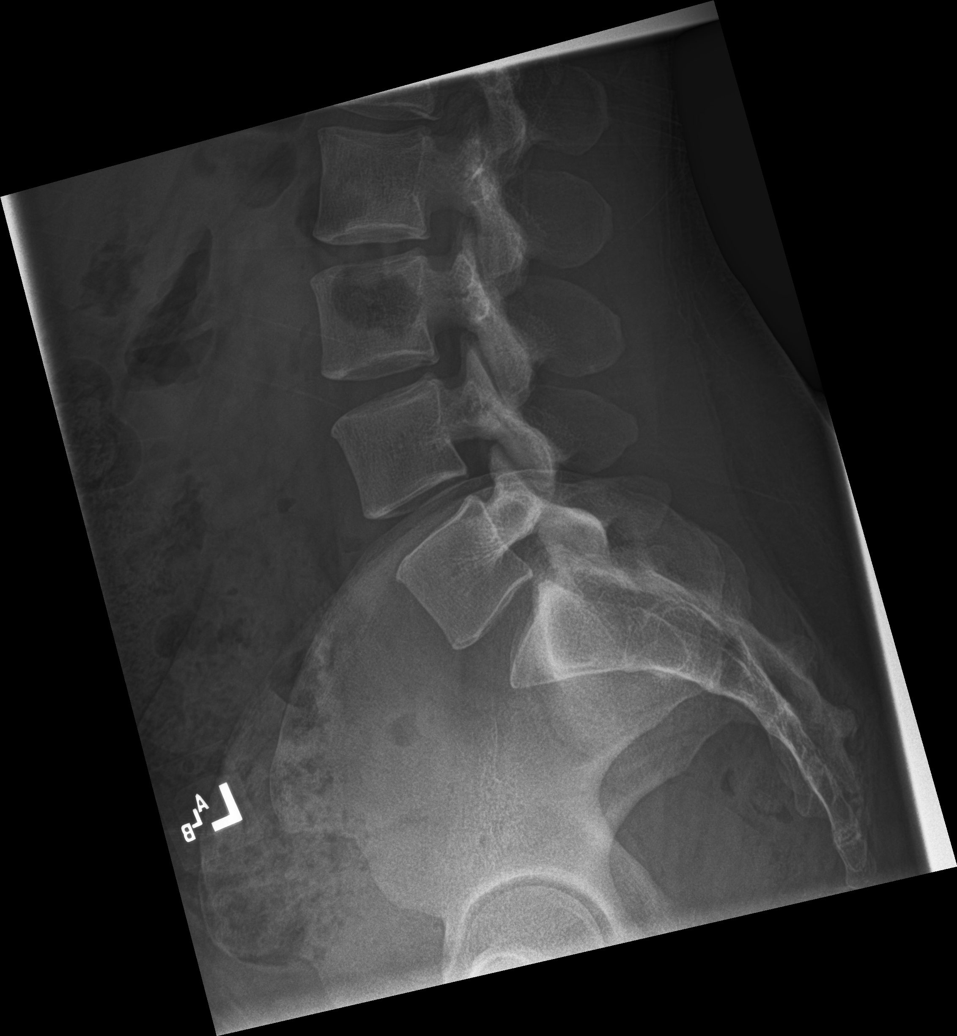

[5 of 5 positions shown; findings below may reference images not displayed]

FINDINGS: There is no evidence of lumbar spine fracture. Alignment is normal.
Intervertebral disc spaces are maintained.
IMPRESSION: Negative.

## 2022-08-23 ENCOUNTER — Encounter: Payer: Self-pay | Admitting: Family Medicine

## 2022-08-23 ENCOUNTER — Ambulatory Visit (INDEPENDENT_AMBULATORY_CARE_PROVIDER_SITE_OTHER): Payer: BC Managed Care – PPO | Admitting: Family Medicine

## 2022-08-23 VITALS — BP 128/70 | HR 79 | Ht 62.0 in | Wt 205.0 lb

## 2022-08-23 DIAGNOSIS — M25811 Other specified joint disorders, right shoulder: Secondary | ICD-10-CM

## 2022-08-23 MED ORDER — DICLOFENAC SODIUM 75 MG PO TBEC: 75.0000 mg | DELAYED_RELEASE_TABLET | Freq: Two times a day (BID) | ORAL | 0 refills | Status: DC

## 2022-08-28 DIAGNOSIS — M25811 Other specified joint disorders, right shoulder: Secondary | ICD-10-CM | POA: Insufficient documentation

## 2022-08-28 NOTE — Progress Notes (Signed)
     Primary Care / Sports Medicine Office Visit  Patient Information:  Patient ID: Christine Arellano, female DOB: Mar 31, 1991 Age: 32 y.o. MRN: [id number]   Christine Arellano is a pleasant 31 y.o. female presenting with the following:  Chief Complaint  Patient presents with   Shoulder Pain    Right. Started a 1-2 years ago. No injury. Patient said she has an xray done of her shoulder in October of last year that came back normal. Never had injections.    Vitals:   08/23/22 1507  BP: 128/70  Pulse: 79  SpO2: 98%   Vitals:   08/23/22 1507  Weight: 205 lb (93 kg)  Height: 5\' 2"  (1.575 m)   Body mass index is 37.49 kg/m.  No results found.   Independent interpretation of notes and tests performed by another provider:   Recent x-ray right shoulder reviewed without degenerative changes, type 1 acromion, no acute processes.  Procedures performed:   None  Pertinent History, Exam, Impression, and Recommendations:   Christine Arellano was seen today for shoulder pain.  Impingement of right shoulder Assessment & Plan: RHD with acute on chronic right shoulder pain.   Examination consistent with right shoulder impingement.  Plan as follows: - diclofenac BID scheduled then PRN - Close follow-up with low threshold to advance to corticosteroid injections   Other orders -     Diclofenac Sodium; Take 1 tablet (75 mg total) by mouth 2 (two) times daily.  Dispense: 30 tablet; Refill: 0     Orders & Medications Meds ordered this encounter  Medications   diclofenac (VOLTAREN) 75 MG EC tablet    Sig: Take 1 tablet (75 mg total) by mouth 2 (two) times daily.    Dispense:  30 tablet    Refill:  0   No orders of the defined types were placed in this encounter.    Return in about 1 week (around 08/30/2022) for 1-2 weeks for 40 min procedure.     Montel Culver, MD, [facility]   Primary Care Sports Medicine Primary Care and Sports Medicine at [facility]

## 2022-08-28 NOTE — Assessment & Plan Note (Signed)
RHD with acute on chronic right shoulder pain.   Examination consistent with right shoulder impingement.  Plan as follows: - diclofenac BID scheduled then PRN - Close follow-up with low threshold to advance to corticosteroid injections

## 2022-08-31 ENCOUNTER — Encounter: Payer: Self-pay | Admitting: Family Medicine

## 2022-08-31 ENCOUNTER — Ambulatory Visit (INDEPENDENT_AMBULATORY_CARE_PROVIDER_SITE_OTHER): Payer: BC Managed Care – PPO | Admitting: Family Medicine

## 2022-08-31 VITALS — BP 128/78 | HR 88 | Ht 62.0 in | Wt 214.0 lb

## 2022-08-31 DIAGNOSIS — R601 Generalized edema: Secondary | ICD-10-CM | POA: Insufficient documentation

## 2022-08-31 NOTE — Patient Instructions (Signed)
-   Obtain labs today - Increase spironolactone to 300 mg (3 tabs) - Stop diclofenac - Maintain follow-up with PCP Dr. Army Melia and rheumatologist

## 2022-09-01 ENCOUNTER — Encounter: Payer: Self-pay | Admitting: Internal Medicine

## 2022-09-01 ENCOUNTER — Ambulatory Visit (INDEPENDENT_AMBULATORY_CARE_PROVIDER_SITE_OTHER): Payer: BC Managed Care – PPO | Admitting: Internal Medicine

## 2022-09-01 VITALS — BP 116/62 | HR 80 | Ht 62.0 in | Wt 210.0 lb

## 2022-09-01 DIAGNOSIS — R600 Localized edema: Secondary | ICD-10-CM

## 2022-09-01 DIAGNOSIS — M797 Fibromyalgia: Secondary | ICD-10-CM | POA: Diagnosis not present

## 2022-09-01 DIAGNOSIS — R7989 Other specified abnormal findings of blood chemistry: Secondary | ICD-10-CM

## 2022-09-01 DIAGNOSIS — I890 Lymphedema, not elsewhere classified: Secondary | ICD-10-CM | POA: Insufficient documentation

## 2022-09-01 LAB — CBC WITH DIFFERENTIAL/PLATELET
Basophils Absolute: 0 10*3/uL (ref 0.0–0.2)
Basos: 0 %
EOS (ABSOLUTE): 0.2 10*3/uL (ref 0.0–0.4)
Eos: 2 %
Hematocrit: 38.8 % (ref 34.0–46.6)
Hemoglobin: 12.8 g/dL (ref 11.1–15.9)
Immature Grans (Abs): 0 10*3/uL (ref 0.0–0.1)
Immature Granulocytes: 0 %
Lymphocytes Absolute: 1.9 10*3/uL (ref 0.7–3.1)
Lymphs: 25 %
MCH: 29.4 pg (ref 26.6–33.0)
MCHC: 33 g/dL (ref 31.5–35.7)
MCV: 89 fL (ref 79–97)
Monocytes Absolute: 0.4 10*3/uL (ref 0.1–0.9)
Monocytes: 5 %
Neutrophils Absolute: 5 10*3/uL (ref 1.4–7.0)
Neutrophils: 68 %
Platelets: 326 10*3/uL (ref 150–450)
RBC: 4.35 x10E6/uL (ref 3.77–5.28)
RDW: 12.5 % (ref 11.7–15.4)
WBC: 7.4 10*3/uL (ref 3.4–10.8)

## 2022-09-01 LAB — COMPREHENSIVE METABOLIC PANEL
ALT: 74 IU/L — ABNORMAL HIGH (ref 0–32)
AST: 57 IU/L — ABNORMAL HIGH (ref 0–40)
Albumin/Globulin Ratio: 1.8 (ref 1.2–2.2)
Albumin: 4.2 g/dL (ref 3.9–4.9)
Alkaline Phosphatase: 72 IU/L (ref 44–121)
BUN/Creatinine Ratio: 16 (ref 9–23)
BUN: 11 mg/dL (ref 6–20)
Bilirubin Total: 0.2 mg/dL (ref 0.0–1.2)
CO2: 20 mmol/L (ref 20–29)
Calcium: 9.2 mg/dL (ref 8.7–10.2)
Chloride: 102 mmol/L (ref 96–106)
Creatinine, Ser: 0.67 mg/dL (ref 0.57–1.00)
Globulin, Total: 2.4 g/dL (ref 1.5–4.5)
Glucose: 128 mg/dL — ABNORMAL HIGH (ref 70–99)
Potassium: 4.1 mmol/L (ref 3.5–5.2)
Sodium: 137 mmol/L (ref 134–144)
Total Protein: 6.6 g/dL (ref 6.0–8.5)
eGFR: 118 mL/min/{1.73_m2} (ref >59)

## 2022-09-01 LAB — URINALYSIS
Bilirubin, UA: NEGATIVE
Glucose, UA: NEGATIVE
Ketones, UA: NEGATIVE
Leukocytes,UA: NEGATIVE
Nitrite, UA: NEGATIVE
Protein,UA: NEGATIVE
RBC, UA: NEGATIVE
Specific Gravity, UA: 1.017 (ref 1.005–1.030)
Urobilinogen, Ur: 0.2 mg/dL (ref 0.2–1.0)
pH, UA: 6.5 (ref 5.0–7.5)

## 2022-09-01 LAB — SEDIMENTATION RATE: Sed Rate: 2 mm/hr (ref 0–32)

## 2022-09-01 LAB — C-REACTIVE PROTEIN: CRP: 2 mg/L (ref 0–10)

## 2022-09-01 MED ORDER — HYDROCHLOROTHIAZIDE 25 MG PO TABS
25.0000 mg | ORAL_TABLET | Freq: Every day | ORAL | 3 refills | Status: DC
Start: 2022-09-01 — End: 2023-07-13

## 2022-09-01 NOTE — Progress Notes (Signed)
Date:  09/01/2022   Name:  Christine Arellano   DOB:  [birth year]   MRN:  [id number]   Chief Complaint: Edema  HPI Edema - much worse over the past few weeks with no change in medications, diet, etc.  She has some improvement in the AM but is much more swollen in the evening.  She eats various foods, does not monitor salt intake.  Working now at [employer]. No recent change in medications although she started Lyrica about 27mo ago and Rheum increased the dose today. No PND or orthopnea, no chest pain or worsening shortness of breath.  Lab Results  Component Value Date   NA 137 08/31/2022   K 4.1 08/31/2022   CO2 20 08/31/2022   GLUCOSE 128 (H) 08/31/2022   BUN 11 08/31/2022   CREATININE 0.67 08/31/2022   CALCIUM 9.2 08/31/2022   EGFR 118 08/31/2022   GFRNONAA >60 03/27/2022   Lab Results  Component Value Date   CHOL 254 (H) 06/30/2022   HDL 85 06/30/2022   LDLCALC 131 (H) 06/30/2022   TRIG 219 (H) 06/30/2022   CHOLHDL 3.0 06/30/2022   Lab Results  Component Value Date   TSH 2.080 06/30/2022   Lab Results  Component Value Date   HGBA1C 4.9 06/30/2022   Lab Results  Component Value Date   WBC 7.4 08/31/2022   HGB 12.8 08/31/2022   HCT 38.8 08/31/2022   MCV 89 08/31/2022   PLT 326 08/31/2022   Lab Results  Component Value Date   ALT 74 (H) 08/31/2022   AST 57 (H) 08/31/2022   ALKPHOS 72 08/31/2022   BILITOT 0.2 08/31/2022   Lab Results  Component Value Date   VD25OH 61.38 03/27/2022     Review of Systems  Constitutional:  Negative for chills, fatigue and unexpected weight change.  HENT:  Negative for nosebleeds.   Eyes:  Negative for visual disturbance.  Respiratory:  Negative for cough, chest tightness, shortness of breath and wheezing.   Cardiovascular:  Positive for leg swelling. Negative for chest pain and palpitations.  Gastrointestinal:  Negative for abdominal pain, constipation and diarrhea.  Musculoskeletal:  Positive for arthralgias and myalgias.   Neurological:  Negative for dizziness, weakness, light-headedness and headaches.  Psychiatric/Behavioral:  Negative for dysphoric mood and sleep disturbance. The patient is not nervous/anxious.     Patient Active Problem List   Diagnosis Date Noted   Lower extremity edema 09/01/2022   Generalized edema 08/31/2022   Impingement of right shoulder 08/28/2022   Mixed hyperlipidemia 06/30/2022   MRI, lumbar spine (05/24/2022) 06/15/2022   Raynaud's disease without gangrene 05/12/2022   Lumbar facet syndrome (Bilateral) (R>L) 04/26/2022    Class: Chronic   Pharmacologic therapy 03/27/2022   Disorder of skeletal system 03/27/2022   Problems influencing health status 03/27/2022   Chronic knee pain (3ry area of Pain) (Bilateral) (R>L) 03/27/2022   Chronic upper back pain (4th area of Pain) (Midline) (Bilateral) 03/27/2022   Chronic shoulder pain (5th area of Pain) (Bilateral) (R>L) 03/27/2022   Chronic hip pain (6th area of Pain) (Bilateral) (R>L) 03/27/2022   Stiffness of right knee 11/16/2021   Fibromyalgia syndrome (7th area of Pain) 09/22/2021   Polyarthralgia 09/22/2021   Chronic sacroiliac joint pain (Bilateral) (R>L) 09/14/2021    Class: Chronic   B12 nutritional deficiency 08/30/2021   Lumbosacral spondylosis with radiculopathy 08/03/2021   Chronic low back pain (1ry area of Pain) (Bilateral) (R>L) w/o sciatica 03/30/2021   BMI 33.0-33.9,adult 03/30/2021  Migraine with aura (8th area of Pain) 01/26/2021   Inverted nipple 05/05/2019   Moderate episode of recurrent major depressive disorder 05/05/2019   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   Generalized anxiety disorder 03/04/2019   Alopecia of scalp 03/04/2019   Dry eye syndrome of both eyes 10/18/2017   Chronic lower extremity pain (2ry area of Pain) (Bilateral) (R>L) 07/06/2016   Adult acne 05/03/2015    No Known Allergies  Past Surgical History:  Procedure Laterality Date   arthroscopic knee Right 2008   ARTHROSCOPIC REPAIR  PCL Right 10/14/2021   CESAREAN SECTION  2014   CESAREAN SECTION N/A 02/24/2015   Procedure: CESAREAN SECTION;  Surgeon: Hildred Laser, MD;  Location: [facility] ORS;  Service: Obstetrics;  Laterality: N/A;   CESAREAN SECTION N/A 05/09/2017   Procedure: REPEAT CESAREAN SECTION;  Surgeon: Hildred Laser, MD;  Location: [facility];  Service: Obstetrics;  Laterality: N/A;   HEMORROIDECTOMY     NASAL ENDOSCOPY Bilateral 01/05/2022   Procedure: VIVAER NASAL VALVE REPAIR;  Surgeon: Vernie Murders, MD;  Location: [facility];  Service: ENT;  Laterality: Bilateral;   NASAL TURBINATE REDUCTION Bilateral 01/05/2022   Procedure: INFERIOR TURBINATE REDUCTION;  Surgeon: Vernie Murders, MD;  Location: [facility];  Service: ENT;  Laterality: Bilateral;   SEPTOPLASTY N/A 01/05/2022   Procedure: SEPTOPLASTY;  Surgeon: Vernie Murders, MD;  Location: [facility];  Service: ENT;  Laterality: N/A;   THERAPEUTIC ABORTION  08/2018   fetal CNS abnormalities   TONSILLECTOMY     WISDOM TOOTH EXTRACTION      Social History   Tobacco Use   Smoking status: Former    Packs/day: 1    Types: Cigarettes    Quit date: 08/27/2009    Years since quitting: 13.0   Smokeless tobacco: Never  Vaping Use   Vaping Use: Never used  Substance Use Topics   Alcohol use: Not Currently    Alcohol/week: 0.0 standard drinks of alcohol   Drug use: No     Medication list has been reviewed and updated.  Current Meds  Medication Sig   celecoxib (CELEBREX) 200 MG capsule ONE TO 2 CAPS BY MOUTH DAILY AS NEEDED FOR PAIN.   cyanocobalamin ([employer] VITAMIN B12) 1000 MCG tablet TAKE 1 TABLET BY MOUTH EVERY DAY   diclofenac (VOLTAREN) 75 MG EC tablet Take 1 tablet (75 mg total) by mouth 2 (two) times daily.   fluticasone (FLONASE) 50 MCG/ACT nasal spray SPRAY 2 SPRAYS INTO EACH NOSTRIL EVERY DAY   hydrochlorothiazide (HYDRODIURIL) 25 MG tablet Take 1 tablet (25 mg total) by mouth daily.   hydrOXYzine (VISTARIL) 50 MG capsule  SMARTSIG:1 Capsule(s) By Mouth 1-2 Times Daily PRN   minoxidil (LONITEN) 2.5 MG tablet Take 2.5 mg by mouth daily.   norgestimate-ethinyl estradiol (ORTHO-CYCLEN) 0.25-35 MG-MCG tablet Take 1 tablet by mouth daily.   nystatin ointment (MYCOSTATIN) APPLY TO AFFECTED AREA TWICE A DAY   polyethylene glycol powder (GLYCOLAX/MIRALAX) 17 GM/SCOOP powder Take 17 g by mouth 2 (two) times daily as needed.   pregabalin (LYRICA) 75 MG capsule Take 75 mg by mouth 2 (two) times daily. Rheumatologist   spironolactone (ALDACTONE) 100 MG tablet Take 200 mg by mouth daily.   traZODone (DESYREL) 100 MG tablet    Vitamin D, Ergocalciferol, (DRISDOL) 1.25 MG (50000 UNIT) CAPS capsule Take 50,000 Units by mouth once a week.   [DISCONTINUED] Semaglutide-Weight Management 0.25 MG/0.5ML SOAJ Inject 0.25 mg into the skin once a week.       09/01/2022  8:51 AM 08/23/2022    3:13 PM 08/03/2022    8:53 AM 06/30/2022    9:14 AM  GAD 7 : Generalized Anxiety Score  Nervous, Anxious, on Edge 2 2 2 3   Control/stop worrying 2 2 1 3   Worry too much - different things 1 2 1 3   Trouble relaxing 1 2 0 1  Restless 1 2 1 1   Easily annoyed or irritable 1 2 1 1   Afraid - awful might happen 1 1 1 1   Total GAD 7 Score 9 13 7 13   Anxiety Difficulty Somewhat difficult Very difficult  Somewhat difficult       09/01/2022    8:50 AM 08/23/2022    3:13 PM 08/03/2022    8:52 AM  Depression screen PHQ 2/9  Decreased Interest 3 2 2   Down, Depressed, Hopeless 2 1 1   PHQ - 2 Score 5 3 3   Altered sleeping 2 3 3   Tired, decreased energy 3 3 2   Change in appetite 3 3 3   Feeling bad or failure about yourself  3 3 1   Trouble concentrating 1 1 1   Moving slowly or fidgety/restless 2 1 0  Suicidal thoughts 0 0 0  PHQ-9 Score 19 17 13   Difficult doing work/chores Very difficult Very difficult Somewhat difficult    BP Readings from Last 3 Encounters:  09/01/22 116/62  08/31/22 128/78  08/23/22 128/70    Physical Exam Vitals and  nursing note reviewed.  Constitutional:      General: She is not in acute distress.    Appearance: Normal appearance. She is well-developed.  HENT:     Head: Normocephalic and atraumatic.  Neck:     Thyroid: No thyroid mass.     Vascular: No carotid bruit or JVD.  Cardiovascular:     Rate and Rhythm: Normal rate and regular rhythm.     Pulses: Normal pulses.     Heart sounds: Normal heart sounds. No murmur heard. Pulmonary:     Effort: Pulmonary effort is normal. No respiratory distress.     Breath sounds: No wheezing or rhonchi.  Abdominal:     Palpations: Abdomen is soft. There is no mass.     Tenderness: There is no abdominal tenderness.     Hernia: No hernia is present.  Musculoskeletal:        General: Swelling present.     Cervical back: Normal range of motion.     Right lower leg: 3+ Edema present.     Left lower leg: 3+ Edema present.  Lymphadenopathy:     Cervical: No cervical adenopathy.  Skin:    General: Skin is warm and dry.     Capillary Refill: Capillary refill takes less than 2 seconds.     Findings: No rash.  Neurological:     Mental Status: She is alert and oriented to person, place, and time.  Psychiatric:        Mood and Affect: Mood normal.        Behavior: Behavior normal.     Wt Readings from Last 3 Encounters:  09/01/22 210 lb (95.3 kg)  08/31/22 214 lb (97.1 kg)  08/23/22 205 lb (93 kg)    BP 116/62   Pulse 80   Ht 5\' 2"  (1.575 m)   Wt 210 lb (95.3 kg)   LMP 07/19/2022   SpO2 99%   BMI 38.41 kg/m   Assessment and Plan:  Problem List Items Addressed This Visit  Other   Lower extremity edema - Primary    Has had mild intermittent edema for years Much worse over the past few weeks with 20 lb wt gain Took an extra spironolactone yesterday - down 4 lbs this AM. Rheum does not think Lyrica is the cause Will add HCTZ and refer for Vasc Surgery evaluation Pt to cut back on salt intake and increase water consumption       Relevant Medications   hydrochlorothiazide (HYDRODIURIL) 25 MG tablet   Other Relevant Orders   Ambulatory referral to Vascular Surgery   Other Visit Diagnoses     Elevated LFTs       noted on labs yesterday otherwise labs are normal. will recheck in one week       No follow-ups on file.   Partially dictated using Dragon software, any errors are not intentional.  Reubin MilanLaura H. Kori Goins, MD [facility] [place]

## 2022-09-01 NOTE — Assessment & Plan Note (Signed)
Has had mild intermittent edema for years Much worse over the past few weeks with 20 lb wt gain Took an extra spironolactone yesterday - down 4 lbs this AM. Rheum does not think Lyrica is the cause Will add HCTZ and refer for Vasc Surgery evaluation Pt to cut back on salt intake and increase water consumption

## 2022-09-04 NOTE — Progress Notes (Signed)
     Primary Care / Sports Medicine Office Visit  Patient Information:  Patient ID: Christine Arellano, female DOB: 18-Jan-1991 Age: 32 y.o. MRN: [id number]   Christine Arellano is a pleasant 32 y.o. female presenting with the following:  Chief Complaint  Patient presents with   Edema    Gradual worsening    Vitals:   08/31/22 0856  BP: 128/78  Pulse: 88  SpO2: 98%   Vitals:   08/31/22 0856  Weight: 214 lb (97.1 kg)  Height: 5\' 2"  (1.575 m)   Body mass index is 39.14 kg/m.  No results found.   Independent interpretation of notes and tests performed by another provider:   None  Procedures performed:   None  Pertinent History, Exam, Impression, and Recommendations:   [name] was seen today for edema.  Generalized edema Assessment & Plan: Reported increase over time, associated with leg tightness, no SOA, orthopnea, no new medications / supplements reported. Denies calf pain.  Exam with +S1, S2, RRR, no additional heart sounds, lung fields clear bilaterally, no bibasilar rales, rhonchi, wheezes. 2+ pitting edema bilaterally, able to appreciated DP pulse bilaterally, sensorimotor intact, calf supple, nontender.  Plan as follows: - Risk stratification labs - Brief titration to 3 tabs spironolactone - Stop diclofenac - She has visits with rheumatology and PCP Dr. Judithann Graves for follow-up  Orders: -     Sedimentation rate -     Urinalysis -     C-reactive protein -     Comprehensive metabolic panel -     CBC with Differential/Platelet -     Brain natriuretic peptide; Future     Orders & Medications No orders of the defined types were placed in this encounter.  Orders Placed This Encounter  Procedures   Sed Rate (ESR)   Urinalysis   C-reactive protein   Comprehensive Metabolic Panel (CMET)   CBC with Differential/Platelet   Brain natriuretic peptide     No follow-ups on file.     Jerrol Banana, MD, [facility]   Primary Care Sports Medicine Primary Care and  Sports Medicine at [facility]

## 2022-09-04 NOTE — Assessment & Plan Note (Signed)
Reported increase over time, associated with leg tightness, no SOA, orthopnea, no new medications / supplements reported. Denies calf pain.  Exam with +S1, S2, RRR, no additional heart sounds, lung fields clear bilaterally, no bibasilar rales, rhonchi, wheezes. 2+ pitting edema bilaterally, able to appreciated DP pulse bilaterally, sensorimotor intact, calf supple, nontender.  Plan as follows: - Risk stratification labs - Brief titration to 3 tabs spironolactone - Stop diclofenac - She has visits with rheumatology and PCP Dr. Judithann Graves for follow-up

## 2022-09-08 ENCOUNTER — Encounter: Payer: BC Managed Care – PPO | Admitting: Internal Medicine

## 2022-09-08 ENCOUNTER — Encounter: Payer: Self-pay | Admitting: Internal Medicine

## 2022-09-08 NOTE — Progress Notes (Signed)
Error

## 2022-09-21 ENCOUNTER — Other Ambulatory Visit: Payer: Self-pay | Admitting: Family Medicine

## 2022-09-21 DIAGNOSIS — G8929 Other chronic pain: Secondary | ICD-10-CM

## 2022-09-21 DIAGNOSIS — M4727 Other spondylosis with radiculopathy, lumbosacral region: Secondary | ICD-10-CM

## 2022-09-21 NOTE — Telephone Encounter (Signed)
Requested medications are due for refill today.  yes  Requested medications are on the active medications list.  yes  Last refill. 05/12/2023 #60 2 rf  Future visit scheduled.   yes  Notes to clinic.  Abnormal labs.    Requested Prescriptions  Pending Prescriptions Disp Refills   celecoxib (CELEBREX) 200 MG capsule [Pharmacy Med Name: CELECOXIB 200 MG CAPSULE] 60 capsule 2    Sig: ONE TO 2 CAPS BY MOUTH DAILY AS NEEDED FOR PAIN.     Analgesics:  COX2 Inhibitors Failed - 09/21/2022  1:55 AM      Failed - Manual Review: Labs are only required if the patient has taken medication for more than 8 weeks.      Failed - AST in normal range and within 360 days    AST  Date Value Ref Range Status  08/31/2022 57 (H) 0 - 40 IU/L Final         Failed - ALT in normal range and within 360 days    ALT  Date Value Ref Range Status  08/31/2022 74 (H) 0 - 32 IU/L Final         Passed - HGB in normal range and within 360 days    Hemoglobin  Date Value Ref Range Status  08/31/2022 12.8 11.1 - 15.9 g/dL Final  16/02/9603 54.0 g/dL Final         Passed - Cr in normal range and within 360 days    Creatinine, Ser  Date Value Ref Range Status  08/31/2022 0.67 0.57 - 1.00 mg/dL Final         Passed - HCT in normal range and within 360 days    HCT  Date Value Ref Range Status  07/21/2014 41 % Final   Hematocrit  Date Value Ref Range Status  08/31/2022 38.8 34.0 - 46.6 % Final         Passed - eGFR is 30 or above and within 360 days    GFR calc Af Amer  Date Value Ref Range Status  05/31/2018 154 >59 mL/min/1.73 Final   GFR, Estimated  Date Value Ref Range Status  03/27/2022 >60 >60 mL/min Final    Comment:    (NOTE) Calculated using the CKD-EPI Creatinine Equation (2021)    eGFR  Date Value Ref Range Status  08/31/2022 118 >59 mL/min/1.73 Final         Passed - Patient is not pregnant      Passed - Valid encounter within last 12 months    Recent Outpatient Visits            2 weeks ago Lower extremity edema   [place] [facility] at [facility], Nyoka Cowden, MD   3 weeks ago Generalized edema   [place] [facility] at [facility], Ocie Bob, MD   4 weeks ago Impingement of right shoulder   [place] [facility] at [facility], Ocie Bob, MD   1 month ago Moderate episode of recurrent major depressive disorder [facility])   [place] [facility] at [facility], Nyoka Cowden, MD   2 months ago Lumbosacral spondylosis with radiculopathy   [facility] Medicine at [facility], Nyoka Cowden, MD       Future Appointments             Tomorrow Reubin Milan, MD [facility]  Primary Care & Sports Medicine at [facility], [facility]   In 9 months Judithann Graves, Nyoka Cowden, MD [facility] Primary Care & Sports Medicine at [facility], [facility]

## 2022-09-22 ENCOUNTER — Ambulatory Visit (INDEPENDENT_AMBULATORY_CARE_PROVIDER_SITE_OTHER): Payer: BC Managed Care – PPO | Admitting: Internal Medicine

## 2022-09-22 ENCOUNTER — Encounter: Payer: Self-pay | Admitting: Internal Medicine

## 2022-09-22 VITALS — BP 110/78 | HR 72 | Ht 62.0 in | Wt 208.0 lb

## 2022-09-22 DIAGNOSIS — R7989 Other specified abnormal findings of blood chemistry: Secondary | ICD-10-CM

## 2022-09-22 DIAGNOSIS — R600 Localized edema: Secondary | ICD-10-CM | POA: Diagnosis not present

## 2022-09-22 NOTE — Patient Instructions (Signed)
-  It was a pleasure to see you today! Please review your visit summary for helpful information. -Lab results are usually available within 1-2 days and we will call once reviewed. -I would encourage you to follow your care via MyChart where you can access lab results, notes, messages, and more. -If you feel that we did a nice job today, please complete your after-visit survey and leave us a Google review! Your CMA today was Phenix Vandermeulen and your provider was Dr Laura Berglund, MD.  

## 2022-09-22 NOTE — Assessment & Plan Note (Signed)
Edema is much improved with spironolactone and HCTZ Will continue current regimen Continue low sodium diet Awaiting VS consultation 

## 2022-09-22 NOTE — Progress Notes (Signed)
Date:  09/22/2022   Name:  Christine Arellano   DOB:  Dec 08, 1990   MRN:  [id number]   Chief Complaint: Edema (Doing ) She is doing better w/r to LE edema.  She did a trial off of Lyrica which increased her pain and did not improve the edema.  Last visit added HCTZ which has helped.  She has lost 5 lbs of fluid and her legs are less painful. She denies leg cramps or other side effects.  She continues also on spironolactone 200 mg daily. HPI  Lab Results  Component Value Date   NA 137 08/31/2022   K 4.1 08/31/2022   CO2 20 08/31/2022   GLUCOSE 128 (H) 08/31/2022   BUN 11 08/31/2022   CREATININE 0.67 08/31/2022   CALCIUM 9.2 08/31/2022   EGFR 118 08/31/2022   GFRNONAA >60 03/27/2022   Lab Results  Component Value Date   CHOL 254 (H) 06/30/2022   HDL 85 06/30/2022   LDLCALC 131 (H) 06/30/2022   TRIG 219 (H) 06/30/2022   CHOLHDL 3.0 06/30/2022   Lab Results  Component Value Date   TSH 2.080 06/30/2022   Lab Results  Component Value Date   HGBA1C 4.9 06/30/2022   Lab Results  Component Value Date   WBC 7.4 08/31/2022   HGB 12.8 08/31/2022   HCT 38.8 08/31/2022   MCV 89 08/31/2022   PLT 326 08/31/2022   Lab Results  Component Value Date   ALT 74 (H) 08/31/2022   AST 57 (H) 08/31/2022   ALKPHOS 72 08/31/2022   BILITOT 0.2 08/31/2022   Lab Results  Component Value Date   VD25OH 61.38 03/27/2022     Review of Systems  Constitutional:  Negative for chills, fatigue and fever.  Respiratory:  Negative for chest tightness and shortness of breath.   Cardiovascular:  Positive for leg swelling. Negative for chest pain and palpitations.  Gastrointestinal:  Negative for abdominal pain.  Musculoskeletal:  Positive for arthralgias and myalgias.  Neurological:  Negative for dizziness.  Psychiatric/Behavioral:  Negative for dysphoric mood and sleep disturbance. The patient is not nervous/anxious.     Patient Active Problem List   Diagnosis Date Noted   Lower extremity  edema 09/01/2022   Impingement of right shoulder 08/28/2022   Mixed hyperlipidemia 06/30/2022   MRI, lumbar spine (05/24/2022) 06/15/2022   Raynaud's disease without gangrene 05/12/2022   Lumbar facet syndrome (Bilateral) (R>L) 04/26/2022    Class: Chronic   Pharmacologic therapy 03/27/2022   Disorder of skeletal system 03/27/2022   Problems influencing health status 03/27/2022   Chronic knee pain (3ry area of Pain) (Bilateral) (R>L) 03/27/2022   Chronic upper back pain (4th area of Pain) (Midline) (Bilateral) 03/27/2022   Chronic shoulder pain (5th area of Pain) (Bilateral) (R>L) 03/27/2022   Chronic hip pain (6th area of Pain) (Bilateral) (R>L) 03/27/2022   Stiffness of right knee 11/16/2021   Fibromyalgia syndrome (7th area of Pain) 09/22/2021   Polyarthralgia 09/22/2021   Chronic sacroiliac joint pain (Bilateral) (R>L) 09/14/2021    Class: Chronic   B12 nutritional deficiency 08/30/2021   Lumbosacral spondylosis with radiculopathy 08/03/2021   Chronic low back pain (1ry area of Pain) (Bilateral) (R>L) w/o sciatica 03/30/2021   BMI 33.0-33.9,adult 03/30/2021   Migraine with aura (8th area of Pain) 01/26/2021   Inverted nipple 05/05/2019   Moderate episode of recurrent major depressive disorder (HCC) 05/05/2019   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   Generalized anxiety disorder 03/04/2019   Alopecia of scalp 03/04/2019  Dry eye syndrome of both eyes 10/18/2017   Chronic lower extremity pain (2ry area of Pain) (Bilateral) (R>L) 07/06/2016   Adult acne 05/03/2015    No Known Allergies  Past Surgical History:  Procedure Laterality Date   arthroscopic knee Right 2008   ARTHROSCOPIC REPAIR PCL Right 10/14/2021   CESAREAN SECTION  2014   CESAREAN SECTION N/A 02/24/2015   Procedure: CESAREAN SECTION;  Surgeon: Hildred Laser, MD;  Location: [facility];  Service: Obstetrics;  Laterality: N/A;   CESAREAN SECTION N/A 05/09/2017   Procedure: REPEAT CESAREAN SECTION;  Surgeon: Hildred Laser,  MD;  Location: [facility];  Service: Obstetrics;  Laterality: N/A;   HEMORROIDECTOMY     NASAL ENDOSCOPY Bilateral 01/05/2022   Procedure: VIVAER NASAL VALVE REPAIR;  Surgeon: Vernie Murders, MD;  Location: [facility];  Service: ENT;  Laterality: Bilateral;   NASAL TURBINATE REDUCTION Bilateral 01/05/2022   Procedure: INFERIOR TURBINATE REDUCTION;  Surgeon: Vernie Murders, MD;  Location: [facility];  Service: ENT;  Laterality: Bilateral;   SEPTOPLASTY N/A 01/05/2022   Procedure: SEPTOPLASTY;  Surgeon: Vernie Murders, MD;  Location: [facility];  Service: ENT;  Laterality: N/A;   THERAPEUTIC ABORTION  08/2018   fetal CNS abnormalities   TONSILLECTOMY     WISDOM TOOTH EXTRACTION      Social History   Tobacco Use   Smoking status: Former    Packs/day: 1    Types: Cigarettes    Quit date: 08/27/2009    Years since quitting: 13.0   Smokeless tobacco: Never  Vaping Use   Vaping Use: Never used  Substance Use Topics   Alcohol use: Not Currently    Alcohol/week: 0.0 standard drinks of alcohol   Drug use: No     Medication list has been reviewed and updated.  Current Meds  Medication Sig   celecoxib (CELEBREX) 200 MG capsule ONE TO 2 CAPS BY MOUTH DAILY AS NEEDED FOR PAIN.   cyanocobalamin ([employer] VITAMIN B12) 1000 MCG tablet TAKE 1 TABLET BY MOUTH EVERY DAY   fluticasone (FLONASE) 50 MCG/ACT nasal spray SPRAY 2 SPRAYS INTO EACH NOSTRIL EVERY DAY   hydrochlorothiazide (HYDRODIURIL) 25 MG tablet Take 1 tablet (25 mg total) by mouth daily.   hydrOXYzine (VISTARIL) 50 MG capsule SMARTSIG:1 Capsule(s) By Mouth 1-2 Times Daily PRN   minoxidil (LONITEN) 2.5 MG tablet Take 2.5 mg by mouth daily.   norgestimate-ethinyl estradiol (ORTHO-CYCLEN) 0.25-35 MG-MCG tablet Take 1 tablet by mouth daily.   nystatin ointment (MYCOSTATIN) APPLY TO AFFECTED AREA TWICE A DAY   polyethylene glycol powder (GLYCOLAX/MIRALAX) 17 GM/SCOOP powder Take 17 g by mouth 2 (two) times daily as needed.    pregabalin (LYRICA) 75 MG capsule Take 75 mg by mouth 2 (two) times daily. Rheumatologist   spironolactone (ALDACTONE) 100 MG tablet Take 200 mg by mouth daily.   traZODone (DESYREL) 100 MG tablet    Vitamin D, Ergocalciferol, (DRISDOL) 1.25 MG (50000 UNIT) CAPS capsule Take 50,000 Units by mouth once a week.   [DISCONTINUED] diclofenac (VOLTAREN) 75 MG EC tablet Take 1 tablet (75 mg total) by mouth 2 (two) times daily.       09/22/2022    4:24 PM 09/08/2022    3:54 PM 09/01/2022    8:51 AM 08/23/2022    3:13 PM  GAD 7 : Generalized Anxiety Score  Nervous, Anxious, on Edge 2 2 2 2   Control/stop worrying 2 2 2 2   Worry too much - different things 0 0 1 2  Trouble relaxing 0 0 1 2  Restless 0 0 1 2  Easily annoyed or irritable 0 0 1 2  Afraid - awful might happen 0 0 1 1  Total GAD 7 Score 4 4 9 13   Anxiety Difficulty Somewhat difficult Somewhat difficult Somewhat difficult Very difficult       09/22/2022    4:23 PM 09/08/2022    3:54 PM 09/01/2022    8:50 AM  Depression screen PHQ 2/9  Decreased Interest 0 3 3  Down, Depressed, Hopeless 0 2 2  PHQ - 2 Score 0 5 5  Altered sleeping 0 2 2  Tired, decreased energy 0 3 3  Change in appetite 0 3 3  Feeling bad or failure about yourself  0 3 3  Trouble concentrating 0 1 1  Moving slowly or fidgety/restless 0 2 2  Suicidal thoughts 0 0 0  PHQ-9 Score 0 19 19  Difficult doing work/chores Not difficult at all Extremely dIfficult Very difficult    BP Readings from Last 3 Encounters:  09/22/22 110/78  09/08/22 126/78  09/01/22 116/62    Physical Exam Vitals and nursing note reviewed.  Constitutional:      General: She is not in acute distress.    Appearance: Normal appearance. She is well-developed.  HENT:     Head: Normocephalic and atraumatic.  Cardiovascular:     Rate and Rhythm: Normal rate and regular rhythm.     Pulses: Normal pulses.     Heart sounds: No murmur heard. Pulmonary:     Effort: Pulmonary effort is  normal. No respiratory distress.     Breath sounds: No wheezing or rhonchi.  Musculoskeletal:        General: Swelling (minimal ankle swelling bilaterally) present.     Cervical back: Normal range of motion.  Lymphadenopathy:     Cervical: No cervical adenopathy.  Skin:    General: Skin is warm and dry.     Capillary Refill: Capillary refill takes less than 2 seconds.     Findings: No rash.  Neurological:     Mental Status: She is alert and oriented to person, place, and time.  Psychiatric:        Mood and Affect: Mood normal.        Behavior: Behavior normal.     Wt Readings from Last 3 Encounters:  09/22/22 208 lb (94.3 kg)  09/08/22 213 lb (96.6 kg)  09/01/22 210 lb (95.3 kg)    BP 110/78   Pulse 72   Ht 5\' 2"  (1.575 m)   Wt 208 lb (94.3 kg)   SpO2 98%   BMI 38.04 kg/m   Assessment and Plan:  Problem List Items Addressed This Visit       Other   Lower extremity edema - Primary    Edema is much improved with spironolactone and HCTZ Will continue current regimen Continue low sodium diet Awaiting VS consultation      Relevant Orders   Comprehensive metabolic panel   Other Visit Diagnoses     Elevated LFTs       mild transaminitis on labs three weeks ago recheck today and do further testing if worsening   Relevant Orders   Comprehensive metabolic panel       No follow-ups on file.   Partially dictated using Dragon software, any errors are not intentional.  Reubin Milan, MD [facility] [place], [place]

## 2022-09-22 NOTE — Assessment & Plan Note (Deleted)
Edema is much improved with spironolactone and HCTZ Will continue current regimen Continue low sodium diet Awaiting VS consultation

## 2022-09-23 LAB — COMPREHENSIVE METABOLIC PANEL
ALT: 25 IU/L (ref 0–32)
AST: 19 IU/L (ref 0–40)
Albumin/Globulin Ratio: 1.9 (ref 1.2–2.2)
Albumin: 4.7 g/dL (ref 3.9–4.9)
Alkaline Phosphatase: 82 IU/L (ref 44–121)
BUN/Creatinine Ratio: 20 (ref 9–23)
BUN: 19 mg/dL (ref 6–20)
Bilirubin Total: <0.2 mg/dL (ref 0.0–1.2)
CO2: 23 mmol/L (ref 20–29)
Calcium: 9.7 mg/dL (ref 8.7–10.2)
Chloride: 97 mmol/L (ref 96–106)
Creatinine, Ser: 0.94 mg/dL (ref 0.57–1.00)
Globulin, Total: 2.5 g/dL (ref 1.5–4.5)
Glucose: 82 mg/dL (ref 70–99)
Potassium: 4.2 mmol/L (ref 3.5–5.2)
Sodium: 136 mmol/L (ref 134–144)
Total Protein: 7.2 g/dL (ref 6.0–8.5)
eGFR: 82 mL/min/{1.73_m2} (ref >59)

## 2022-10-13 ENCOUNTER — Ambulatory Visit (INDEPENDENT_AMBULATORY_CARE_PROVIDER_SITE_OTHER): Payer: BC Managed Care – PPO | Admitting: Vascular Surgery

## 2022-10-13 ENCOUNTER — Encounter (INDEPENDENT_AMBULATORY_CARE_PROVIDER_SITE_OTHER): Payer: Self-pay | Admitting: Vascular Surgery

## 2022-10-13 VITALS — BP 102/70 | HR 80 | Resp 16 | Ht 62.0 in | Wt 212.6 lb

## 2022-10-13 DIAGNOSIS — E782 Mixed hyperlipidemia: Secondary | ICD-10-CM | POA: Diagnosis not present

## 2022-10-13 DIAGNOSIS — M7989 Other specified soft tissue disorders: Secondary | ICD-10-CM

## 2022-10-13 NOTE — Patient Instructions (Signed)
Lymphedema ? ?Lymphedema is swelling that is caused by the abnormal collection of lymph in the tissues under the skin. Lymph is excess fluid from the tissues in your body that is removed through the lymphatic system. This system is part of your body's defense system (immune system) and includes lymph nodes and lymph vessels. The lymph vessels collect and carry the excess fluid, fats, proteins, and waste from the tissues of the body to the bloodstream. This system also works to clean and remove bacteria and waste products from the body. ?Lymphedema occurs when the lymphatic system is blocked. When the lymph vessels or lymph nodes are blocked or damaged, lymph does not drain properly. This causes an abnormal buildup of lymph, which leads to swelling in the affected area. This may include the trunk area, or an arm or leg. Lymphedema cannot be cured by medicines, but various methods can be used to help reduce the swelling. ?What are the causes? ?The cause of this condition depends on the type of lymphedema that you have. ?Primary lymphedema is caused by the absence of lymph vessels or having abnormal lymph vessels at birth. ?Secondary lymphedema occurs when lymph vessels are blocked or damaged. Secondary lymphedema is more common. Common causes of lymph vessel blockage include: ?Skin infection, such as cellulitis. ?Infection by parasites (filariasis). ?Injury. ?Radiation therapy. ?Cancer. ?Formation of scar tissue. ?Surgery. ?What are the signs or symptoms? ?Symptoms of this condition include: ?Swelling of the arm or leg. ?A heavy or tight feeling in the arm or leg. ?Swelling of the feet, toes, or fingers. Shoes or rings may fit more tightly than before. ?Redness of the skin over the affected area. ?Limited movement of the affected limb. ?Sensitivity to touch or discomfort in the affected limb. ?How is this diagnosed? ?This condition may be diagnosed based on: ?Your symptoms and medical history. ?A physical  exam. ?Bioimpedance spectroscopy. In this test, painless electrical currents are used to measure fluid levels in your body. ?Imaging tests, such as: ?MRI. ?CT scan. ?Duplex ultrasound. This test uses sound waves to produce images of the vessels and the blood flow on a screen. ?Lymphoscintigraphy. In this test, a low dose of a radioactive substance is injected to trace the flow of lymph through your lymph vessels. ?Lymphangiography. In this test, a contrast dye is injected into the lymph vessel to help show blockages. ?How is this treated? ? ?If an underlying condition is causing the lymphedema, that condition will be treated. For example, antibiotic medicines may be used to treat an infection. ?Treatment for this condition will depend on the cause of your lymphedema. Treatment may include: ?Complete decongestive therapy (CDT). This is done by a certified lymphedema therapist to reduce fluid congestion. This therapy includes: ?Skin care. ?Compression wrapping of the affected area. ?Manual lymph drainage. This is a special massage technique that promotes lymph drainage out of a limb. ?Specific exercises. Certain exercises can help fluid move out of the affected limb. ?Compression. Various methods may be used to apply pressure to the affected limb to reduce the swelling. They include: ?Wearing compression stockings or sleeves on the affected limb. ?Wrapping the affected limb with special bandages. ?Surgery. This is usually done for severe cases only. For example, surgery may be done if you have trouble moving the limb or if the swelling does not get better with other treatments. ?Follow these instructions at home: ?Self-care ?The affected area is more likely to become injured or infected. Take these steps to help prevent infection: ?Keep the affected   area clean and dry. ?Use approved creams or lotions to keep the skin moisturized. ?Protect your skin from cuts: ?Use gloves while cooking or gardening. ?Do not walk  barefoot. ?If you shave the affected area, use an electric razor. ?Do not wear tight clothes, shoes, or jewelry. ?Eat a healthy diet that includes a lot of fruits and vegetables. ?Activity ?Do exercises as told by your health care provider. ?Do not sit with your legs crossed. ?When possible, keep the affected limb raised (elevated) above the level of your heart. ?Avoid carrying things with an arm that is affected by lymphedema. ?General instructions ?Wear compression stockings or sleeves as told by your health care provider. ?Note any changes in size of the affected limb. You may be instructed to take regular measurements and keep track of them. ?Take over-the-counter and prescription medicines only as told by your health care provider. ?If you were prescribed an antibiotic medicine, take or apply it as told by your health care provider. Do not stop using the antibiotic even if you start to feel better or if your condition improves. ?Do not use heating pads or ice packs on the affected area. ?Avoid having blood draws, IV insertions, or blood pressure checks on the affected limb. ?Keep all follow-up visits. This is important. ?Contact a health care provider if you: ?Continue to have swelling in your limb. ?Have fluid leaking from the skin of your swollen limb. ?Have a cut that does not heal. ?Have redness or pain in the affected area. ?Develop purplish spots, rash, blisters, or sores (lesions) on your affected limb. ?Get help right away if you: ?Have new swelling in your limb that starts suddenly. ?Have shortness of breath or chest pain. ?Have a fever or chills. ?These symptoms may represent a serious problem that is an emergency. Do not wait to see if the symptoms will go away. Get medical help right away. Call your local emergency services (911 in the [place] Do not drive yourself to the hospital. ?Summary ?Lymphedema is swelling that is caused by the abnormal collection of lymph in the tissues under the  skin. ?Lymph is fluid from the tissues in your body that is removed through the lymphatic system. This system collects and carries excess fluid, fats, proteins, and wastes from the tissues of the body to the bloodstream. ?Lymphedema causes swelling, pain, and redness in the affected area. This may include the trunk area, or an arm or leg. ?Treatment for this condition may depend on the cause of your lymphedema. Treatment may include treating the underlying cause, complete decongestive therapy (CDT), compression methods, or surgery. ?This information is not intended to replace advice given to you by your health care provider. Make sure you discuss any questions you have with your health care provider. ?Document Revised: 03/10/2020 Document Reviewed: 03/10/2020 ?[employer]vier Patient Education ? 2023 [employer] ? ?

## 2022-10-13 NOTE — Progress Notes (Signed)
Patient ID: Christine Arellano, female   DOB: 07-Apr-1991, 32 y.o.   MRN: [id number]  Chief Complaint  Patient presents with   New Patient (Initial Visit)    Ref berglund consult le edema    HPI Christine Arellano is a 32 y.o. female.  I am asked to see the patient by Dr. Judithann Graves for evaluation of edema.  The patient says she has had puffy ankles and some swelling in her legs for as long as she can remember.  This got significantly worse after a significant accident when she was in high school resulted in what sounds like a reasonably complex surgery on her right knee for repair of damage.  She denies any open wounds or ulceration.  She has began developing swelling on the left leg as well.  She has worn compression socks in times in the past but none recently.  The swelling progresses throughout the day and is very severe in the evenings.  She tries to elevate her legs is much as possible and has remained active.  She had a second surgery on her right leg a couple of years ago and the swelling seems to have exacerbated more since then.  No history of DVT or superficial thrombophlebitis to her knowledge.     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  SMA and CF pending    S/P cesarean section 02/24/2015    Past Surgical History:  Procedure Laterality Date   arthroscopic knee Right 2008   ARTHROSCOPIC REPAIR PCL Right 10/14/2021   CESAREAN SECTION  2014   CESAREAN SECTION N/A 02/24/2015   Procedure: CESAREAN SECTION;  Surgeon: Hildred Laser, MD;  Location: [facility];  Service: Obstetrics;  Laterality: N/A;   CESAREAN  SECTION N/A 05/09/2017   Procedure: REPEAT CESAREAN SECTION;  Surgeon: Hildred Laser, MD;  Location: [facility];  Service: Obstetrics;  Laterality: N/A;   HEMORROIDECTOMY     NASAL ENDOSCOPY Bilateral 01/05/2022   Procedure: VIVAER NASAL VALVE REPAIR;  Surgeon: Vernie Murders, MD;  Location: [facility];  Service: ENT;  Laterality: Bilateral;   NASAL TURBINATE REDUCTION Bilateral 01/05/2022   Procedure: INFERIOR TURBINATE REDUCTION;  Surgeon: Vernie Murders, MD;  Location: [facility];  Service: ENT;  Laterality: Bilateral;   SEPTOPLASTY N/A 01/05/2022   Procedure: SEPTOPLASTY;  Surgeon: Vernie Murders, MD;  Location: [facility];  Service: ENT;  Laterality: N/A;   THERAPEUTIC ABORTION  08/2018   fetal CNS abnormalities   TONSILLECTOMY     WISDOM TOOTH EXTRACTION       Family History  Problem Relation Age of Onset   Depression Father    Hypertension Father    Cancer Paternal Aunt        breast   Breast cancer Paternal Aunt    Heart disease Paternal Uncle    Cancer Maternal Grandfather    Parkinson's disease Paternal Grandmother    Ovarian cancer Neg Hx    Colon cancer Neg Hx       Social History  Tobacco Use   Smoking status: Former    Packs/day: 1    Types: Cigarettes    Quit date: 08/27/2009    Years since quitting: 13.1   Smokeless tobacco: Never  Vaping Use   Vaping Use: Never used  Substance Use Topics   Alcohol use: Not Currently    Alcohol/week: 0.0 standard drinks of alcohol   Drug use: No     No Known Allergies  Current Outpatient Medications  Medication Sig Dispense Refill   celecoxib (CELEBREX) 200 MG capsule ONE TO 2 CAPS BY MOUTH DAILY AS NEEDED FOR PAIN. 60 capsule 2   cyanocobalamin ([employer] VITAMIN B12) 1000 MCG tablet TAKE 1 TABLET BY MOUTH EVERY DAY 90 tablet 3   FLUoxetine HCl 60 MG TABS Take 60 mg by mouth daily.     fluticasone (FLONASE) 50 MCG/ACT nasal spray SPRAY 2 SPRAYS INTO EACH NOSTRIL EVERY DAY 16 mL 2    hydrochlorothiazide (HYDRODIURIL) 25 MG tablet Take 1 tablet (25 mg total) by mouth daily. 90 tablet 3   hydrOXYzine (VISTARIL) 50 MG capsule SMARTSIG:1 Capsule(s) By Mouth 1-2 Times Daily PRN     minoxidil (LONITEN) 2.5 MG tablet Take 2.5 mg by mouth daily.     norgestimate-ethinyl estradiol (ORTHO-CYCLEN) 0.25-35 MG-MCG tablet Take 1 tablet by mouth daily. 84 tablet 3   nystatin ointment (MYCOSTATIN) APPLY TO AFFECTED AREA TWICE A DAY 30 g 0   polyethylene glycol powder (GLYCOLAX/MIRALAX) 17 GM/SCOOP powder Take 17 g by mouth 2 (two) times daily as needed. 3350 g 1   pregabalin (LYRICA) 75 MG capsule Take 75 mg by mouth 2 (two) times daily. Rheumatologist     spironolactone (ALDACTONE) 100 MG tablet Take 200 mg by mouth daily.     traZODone (DESYREL) 100 MG tablet      Vitamin D, Ergocalciferol, (DRISDOL) 1.25 MG (50000 UNIT) CAPS capsule Take 50,000 Units by mouth once a week.     No current facility-administered medications for this visit.      REVIEW OF SYSTEMS (Negative unless checked)  Constitutional: [] Weight loss  [] Fever  [] Chills Cardiac: [] Chest pain   [] Chest pressure   [] Palpitations   [] Shortness of breath when laying flat   [] Shortness of breath at rest   [] Shortness of breath with exertion. Vascular:  [] Pain in legs with walking   [] Pain in legs at rest   [] Pain in legs when laying flat   [] Claudication   [] Pain in feet when walking  [] Pain in feet at rest  [] Pain in feet when laying flat   [] History of DVT   [] Phlebitis   [x] Swelling in legs   [] Varicose veins   [] Non-healing ulcers Pulmonary:   [] Uses home oxygen   [] Productive cough   [] Hemoptysis   [] Wheeze  [] COPD   [] Asthma Neurologic:  [] Dizziness  [] Blackouts   [] Seizures   [] History of stroke   [] History of TIA  [] Aphasia   [] Temporary blindness   [] Dysphagia   [] Weakness or numbness in arms   [] Weakness or numbness in legs Musculoskeletal:  [] Arthritis   [] Joint swelling   [] Joint pain   [] Low back pain Hematologic:   [] Easy bruising  [] Easy bleeding   [] Hypercoagulable state   [] Anemic  [] Hepatitis Gastrointestinal:  [] Blood in stool   [] Vomiting blood  [x] Gastroesophageal reflux/heartburn   [] Abdominal pain Genitourinary:  [] Chronic kidney disease   [] Difficult urination  [] Frequent urination  [] Burning with urination   [] Hematuria Skin:  [] Rashes   [] Ulcers   [] Wounds Psychological:  [x] History of  anxiety   [x]  History of major depression.    Physical Exam BP 102/70 (BP Location: Right Arm)   Pulse 80   Resp 16   Ht 5\' 2"  (1.575 m)   Wt 212 lb 9.6 oz (96.4 kg)   BMI 38.89 kg/m  Gen:  WD/WN, NAD Head: [place]/AT, No temporalis wasting.  Ear/Nose/Throat: Hearing grossly intact, nares w/o erythema or drainage, oropharynx w/o Erythema/Exudate Eyes: Conjunctiva clear, sclera non-icteric  Neck: trachea midline.  No JVD.  Pulmonary:  Good air movement, respirations not labored, no use of accessory muscles  Cardiac: RRR, no JVD Vascular:  Vessel Right Left  Radial Palpable Palpable                          DP 1+ 2+  PT 1+ 1+   Gastrointestinal:. No masses, surgical incisions, or scars. Musculoskeletal: M/S 5/5 throughout.  Extremities without ischemic changes.  No deformity or atrophy.  2+ right lower extremity edema and 1+ left lower extremity edema.  No prominent varicosities are seen.  There is some thickening of the skin and hyperpigmentation but no true stasis dermatitis changes. Neurologic: Sensation grossly intact in extremities.  Symmetrical.  Speech is fluent. Motor exam as listed above. Psychiatric: Judgment intact, Mood & affect appropriate for pt's clinical situation. Dermatologic: No rashes or ulcers noted.  No cellulitis or open wounds.    Radiology No results found.  Labs Recent Results (from the past 2160 hour(s))  Sed Rate (ESR)     Status: None   Collection Time: 08/31/22  9:38 AM  Result Value Ref Range   Sed Rate 2 0 - 32 mm/hr  Urinalysis     Status: None    Collection Time: 08/31/22  9:38 AM  Result Value Ref Range   Specific Gravity, UA 1.017 1.005 - 1.030   pH, UA 6.5 5.0 - 7.5   Color, UA Yellow Yellow   Appearance Ur Clear Clear   Leukocytes,UA Negative Negative   Protein,UA Negative Negative/Trace   Glucose, UA Negative Negative   Ketones, UA Negative Negative   RBC, UA Negative Negative   Bilirubin, UA Negative Negative   Urobilinogen, Ur 0.2 0.2 - 1.0 mg/dL   Nitrite, UA Negative Negative  C-reactive protein     Status: None   Collection Time: 08/31/22  9:38 AM  Result Value Ref Range   CRP 2 0 - 10 mg/L  Comprehensive Metabolic Panel (CMET)     Status: Abnormal   Collection Time: 08/31/22  9:38 AM  Result Value Ref Range   Glucose 128 (H) 70 - 99 mg/dL   BUN 11 6 - 20 mg/dL   Creatinine, Ser 1.61 0.57 - 1.00 mg/dL   eGFR 096 >04 VW/UJW/1.19   BUN/Creatinine Ratio 16 9 - 23   Sodium 137 134 - 144 mmol/L   Potassium 4.1 3.5 - 5.2 mmol/L   Chloride 102 96 - 106 mmol/L   CO2 20 20 - 29 mmol/L   Calcium 9.2 8.7 - 10.2 mg/dL   Total Protein 6.6 6.0 - 8.5 g/dL   Albumin 4.2 3.9 - 4.9 g/dL   Globulin, Total 2.4 1.5 - 4.5 g/dL   Albumin/Globulin Ratio 1.8 1.2 - 2.2   Bilirubin Total 0.2 0.0 - 1.2 mg/dL   Alkaline Phosphatase 72 44 - 121 IU/L   AST 57 (H) 0 - 40 IU/L   ALT 74 (H) 0 - 32 IU/L  CBC with Differential/Platelet  Status: None   Collection Time: 08/31/22  9:38 AM  Result Value Ref Range   WBC 7.4 3.4 - 10.8 x10E3/uL   RBC 4.35 3.77 - 5.28 x10E6/uL   Hemoglobin 12.8 11.1 - 15.9 g/dL   Hematocrit 78.2 95.6 - 46.6 %   MCV 89 79 - 97 fL   MCH 29.4 26.6 - 33.0 pg   MCHC 33.0 31.5 - 35.7 g/dL   RDW 21.3 08.6 - 57.8 %   Platelets 326 150 - 450 x10E3/uL   Neutrophils 68 Not Estab. %   Lymphs 25 Not Estab. %   Monocytes 5 Not Estab. %   Eos 2 Not Estab. %   Basos 0 Not Estab. %   Neutrophils Absolute 5.0 1.4 - 7.0 x10E3/uL   Lymphocytes Absolute 1.9 0.7 - 3.1 x10E3/uL   Monocytes Absolute 0.4 0.1 - 0.9  x10E3/uL   EOS (ABSOLUTE) 0.2 0.0 - 0.4 x10E3/uL   Basophils Absolute 0.0 0.0 - 0.2 x10E3/uL   Immature Granulocytes 0 Not Estab. %   Immature Grans (Abs) 0.0 0.0 - 0.1 x10E3/uL  Comprehensive metabolic panel     Status: None   Collection Time: 09/22/22  4:45 PM  Result Value Ref Range   Glucose 82 70 - 99 mg/dL   BUN 19 6 - 20 mg/dL   Creatinine, Ser 4.69 0.57 - 1.00 mg/dL   eGFR 82 >62 XB/MWU/1.32   BUN/Creatinine Ratio 20 9 - 23   Sodium 136 134 - 144 mmol/L   Potassium 4.2 3.5 - 5.2 mmol/L   Chloride 97 96 - 106 mmol/L   CO2 23 20 - 29 mmol/L   Calcium 9.7 8.7 - 10.2 mg/dL   Total Protein 7.2 6.0 - 8.5 g/dL   Albumin 4.7 3.9 - 4.9 g/dL   Globulin, Total 2.5 1.5 - 4.5 g/dL   Albumin/Globulin Ratio 1.9 1.2 - 2.2   Bilirubin Total <0.2 0.0 - 1.2 mg/dL   Alkaline Phosphatase 82 44 - 121 IU/L   AST 19 0 - 40 IU/L   ALT 25 0 - 32 IU/L    Assessment/Plan:  Swelling of limb Recommend:  I have had a long discussion with the patient regarding swelling and why it  causes symptoms.  Patient will begin wearing graduated compression on a daily basis a prescription was given. The patient will  wear the stockings first thing in the morning and removing them in the evening. The patient is instructed specifically not to sleep in the stockings.   In addition, behavioral modification will be initiated.  This will include frequent elevation, use of over the counter pain medications and exercise such as walking.  Consideration for a lymph pump will also be made based upon the effectiveness of conservative therapy.  This would help to improve the edema control and prevent sequela such as ulcers and infections   Patient should undergo duplex ultrasound of the venous system to ensure that DVT or reflux is not present.  The patient will follow-up with me after the ultrasound.   Mixed hyperlipidemia lipid control important in reducing the progression of atherosclerotic disease.       Festus Barren 10/13/2022, 3:22 PM   This note was created with Dragon medical transcription system.  Any errors from dictation are unintentional.

## 2022-10-13 NOTE — Assessment & Plan Note (Signed)

## 2022-10-13 NOTE — Assessment & Plan Note (Signed)
lipid control important in reducing the progression of atherosclerotic disease.   

## 2022-11-08 ENCOUNTER — Ambulatory Visit (INDEPENDENT_AMBULATORY_CARE_PROVIDER_SITE_OTHER): Payer: BC Managed Care – PPO

## 2022-11-08 ENCOUNTER — Ambulatory Visit (INDEPENDENT_AMBULATORY_CARE_PROVIDER_SITE_OTHER): Payer: BC Managed Care – PPO | Admitting: Nurse Practitioner

## 2022-11-08 ENCOUNTER — Encounter (INDEPENDENT_AMBULATORY_CARE_PROVIDER_SITE_OTHER): Payer: Self-pay | Admitting: Nurse Practitioner

## 2022-11-08 VITALS — BP 113/85 | HR 101 | Resp 16 | Wt 217.8 lb

## 2022-11-08 DIAGNOSIS — I890 Lymphedema, not elsewhere classified: Secondary | ICD-10-CM

## 2022-11-08 DIAGNOSIS — M797 Fibromyalgia: Secondary | ICD-10-CM

## 2022-11-08 DIAGNOSIS — M7989 Other specified soft tissue disorders: Secondary | ICD-10-CM | POA: Diagnosis not present

## 2022-11-08 DIAGNOSIS — E782 Mixed hyperlipidemia: Secondary | ICD-10-CM | POA: Diagnosis not present

## 2022-11-08 NOTE — Progress Notes (Signed)
Subjective:    Patient ID: Christine Arellano, female    DOB: 1991/03/31, 32 y.o.   MRN: [id number] Chief Complaint  Patient presents with   Follow-up    Ultrasound follow up    Christine Arellano is a 32 y.o. female.  She returns for evaluation of lower extremity edema.  The patient says she has had puffy ankles and some swelling in her legs for as long as she can remember.  This got significantly worse after a significant accident when she was in high school resulted in what sounds like a reasonably complex surgery on her right knee for repair of damage.  She denies any open wounds or ulceration.  She has began developing swelling on the left leg as well.  She has worn compression socks in times in the past but none recently.  The swelling progresses throughout the day and is very severe in the evenings.  She tries to elevate her legs is much as possible and has remained active.  She had a second surgery on her right leg a couple of years ago and the swelling seems to have exacerbated more since then.  No history of DVT or superficial thrombophlebitis to her knowledge.  Since she was last seen she has been wearing medical grade compression socks as advised and has noticed that this has definitely helped the swelling but upon removal of the compression socks her swelling immediately returns.  Today noninvasive studies show no evidence of DVT or superficial thrombophlebitis in the right lower extremity.  No evidence of deep venous insufficiency or superficial venous reflux.    Review of Systems  Cardiovascular:  Positive for leg swelling.  All other systems reviewed and are negative.      Objective:   Physical Exam Vitals reviewed.  HENT:     Head: Normocephalic.  Cardiovascular:     Rate and Rhythm: Normal rate.     Pulses: Normal pulses.  Pulmonary:     Effort: Pulmonary effort is normal.  Musculoskeletal:     Right lower leg: Edema present.     Left lower leg: Edema present.  Skin:     General: Skin is warm and dry.  Neurological:     Mental Status: She is alert and oriented to person, place, and time.  Psychiatric:        Mood and Affect: Mood normal.        Behavior: Behavior normal.        Thought Content: Thought content normal.        Judgment: Judgment normal.     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  SMA and CF pending    S/P cesarean section 02/24/2015    Social History   Socioeconomic History   Marital status: Single    Spouse name: Akeem   Number of children:  Not on file   Years of education: Not on file   Highest education level: Some college, no degree  Occupational History   Occupation: unemployed  Tobacco Use   Smoking status: Former    Packs/day: 1    Types: Cigarettes    Quit date: 08/27/2009    Years since quitting: 13.2   Smokeless tobacco: Never  Vaping Use   Vaping Use: Never used  Substance and Sexual Activity   Alcohol use: Not Currently    Alcohol/week: 0.0 standard drinks of alcohol   Drug use: No   Sexual activity: Yes    Birth control/protection: Pill  Other Topics Concern   Not on file  Social History Narrative   Not on file   Social Determinants of Health   Financial Resource Strain: Patient Declined (09/07/2022)   Overall Financial Resource Strain (CARDIA)    Difficulty of Paying Living Expenses: Patient declined  Food Insecurity: Patient Declined (09/07/2022)   Hunger Vital Sign    Worried About Running Out of Food in  the Last Year: Patient declined    Ran Out of Food in the Last Year: Patient declined  Transportation Needs: No Transportation Needs (09/07/2022)   PRAPARE - Administrator, Civil Service (Medical): No    Lack of Transportation (Non-Medical): No  Physical Activity: Unknown (09/07/2022)   Exercise Vital Sign    Days of Exercise per Week: 0 days    Minutes of Exercise per Session: Not on file  Stress: Stress Concern Present (09/07/2022)   [employer] of Occupational Health - Occupational Stress Questionnaire    Feeling of Stress : Very much  Social Connections: Unknown (09/07/2022)   Social Connection and Isolation Panel [NHANES]    Frequency of Communication with Friends and Family: Once a week    Frequency of Social Gatherings with Friends and Family: Patient declined    Attends Religious Services: Never    Database administrator or Organizations: No    Attends Engineer, structural: Not on file    Marital Status: Never married  Intimate Partner Violence: Not At Risk (05/12/2022)   Humiliation, Afraid, Rape, and Kick questionnaire    Fear of Current or Ex-Partner: No    Emotionally Abused: No    Physically Abused: No    Sexually Abused: No    Past Surgical History:  Procedure Laterality Date   arthroscopic knee Right 2008   ARTHROSCOPIC REPAIR PCL Right 10/14/2021   CESAREAN SECTION  2014   CESAREAN SECTION N/A 02/24/2015   Procedure: CESAREAN SECTION;  Surgeon: Hildred Laser, MD;  Location: [facility];  Service: Obstetrics;  Laterality: N/A;   CESAREAN SECTION N/A 05/09/2017   Procedure: REPEAT CESAREAN SECTION;  Surgeon: Hildred Laser, MD;  Location: [facility];  Service: Obstetrics;  Laterality: N/A;   HEMORROIDECTOMY     NASAL ENDOSCOPY Bilateral 01/05/2022   Procedure: VIVAER NASAL VALVE REPAIR;  Surgeon: Vernie Murders, MD;  Location: [facility];  Service: ENT;  Laterality: Bilateral;   NASAL TURBINATE REDUCTION Bilateral 01/05/2022   Procedure:  INFERIOR TURBINATE REDUCTION;  Surgeon: Vernie Murders, MD;  Location: [facility];  Service: ENT;  Laterality: Bilateral;   SEPTOPLASTY N/A 01/05/2022   Procedure: SEPTOPLASTY;  Surgeon: Vernie Murders, MD;  Location: [facility];  Service: ENT;  Laterality: N/A;   THERAPEUTIC ABORTION  08/2018   fetal CNS abnormalities   TONSILLECTOMY     WISDOM TOOTH EXTRACTION      Family History  Problem  Relation Age of Onset   Depression Father    Hypertension Father    Cancer Paternal Aunt        breast   Breast cancer Paternal Aunt    Heart disease Paternal Uncle    Cancer Maternal Grandfather    Parkinson's disease Paternal Grandmother    Ovarian cancer Neg Hx    Colon cancer Neg Hx     No Known Allergies     Latest Ref Rng & Units 08/31/2022    9:38 AM 06/30/2022    9:43 AM 10/31/2021   11:35 AM  CBC  WBC 3.4 - 10.8 x10E3/uL 7.4  7.0  9.2   Hemoglobin 11.1 - 15.9 g/dL 78.2  95.6  21.3   Hematocrit 34.0 - 46.6 % 38.8  39.1  37.3   Platelets 150 - 450 x10E3/uL 326  334  379       CMP     Component Value Date/Time   NA 136 09/22/2022 1645   K 4.2 09/22/2022 1645   CL 97 09/22/2022 1645   CO2 23 09/22/2022 1645   GLUCOSE 82 09/22/2022 1645   GLUCOSE 82 03/27/2022 1028   BUN 19 09/22/2022 1645   CREATININE 0.94 09/22/2022 1645   CALCIUM 9.7 09/22/2022 1645   PROT 7.2 09/22/2022 1645   ALBUMIN 4.7 09/22/2022 1645   AST 19 09/22/2022 1645   ALT 25 09/22/2022 1645   ALKPHOS 82 09/22/2022 1645   BILITOT <0.2 09/22/2022 1645   EGFR 82 09/22/2022 1645   GFRNONAA >60 03/27/2022 1028     No results found.     Assessment & Plan:   1. Lymphedema Today the patient has no evidence of venous insufficiency or reflux.  Based on the studies I believe that the patient has lymphedema.  She has been wearing compression socks and they have been very helpful however she still continues to have swelling when she removes them.  On this basis I believe she will be a good  candidate for lymphedema pump.  Will work on facilitating this for her and have her return in 6 months for reevaluation of lower extremity edema.  2. Fibromyalgia syndrome (7th area of Pain) Inflammation related to the fibromyalgia syndrome may also make her lower extremity edema worse at times of exacerbation.  3. Mixed hyperlipidemia Continue statin as ordered and reviewed, no changes at this time   Current Outpatient Medications on File Prior to Visit  Medication Sig Dispense Refill   celecoxib (CELEBREX) 200 MG capsule ONE TO 2 CAPS BY MOUTH DAILY AS NEEDED FOR PAIN. 60 capsule 2   cyanocobalamin ([employer] VITAMIN B12) 1000 MCG tablet TAKE 1 TABLET BY MOUTH EVERY DAY 90 tablet 3   FLUoxetine HCl 60 MG TABS Take 60 mg by mouth daily.     fluticasone (FLONASE) 50 MCG/ACT nasal spray SPRAY 2 SPRAYS INTO EACH NOSTRIL EVERY DAY 16 mL 2   hydrochlorothiazide (HYDRODIURIL) 25 MG tablet Take 1 tablet (25 mg total) by mouth daily. 90 tablet 3   hydrOXYzine (VISTARIL) 50 MG capsule SMARTSIG:1 Capsule(s) By Mouth 1-2 Times Daily PRN     minoxidil (LONITEN) 2.5 MG tablet Take 2.5 mg by mouth daily.     norgestimate-ethinyl estradiol (ORTHO-CYCLEN) 0.25-35 MG-MCG tablet Take 1 tablet by mouth daily. 84 tablet 3   nystatin ointment (MYCOSTATIN) APPLY TO AFFECTED AREA TWICE A DAY 30 g 0   polyethylene glycol powder (GLYCOLAX/MIRALAX) 17 GM/SCOOP powder Take 17 g by mouth 2 (two) times daily as needed.  3350 g 1   pregabalin (LYRICA) 75 MG capsule Take 75 mg by mouth 2 (two) times daily. Rheumatologist     spironolactone (ALDACTONE) 100 MG tablet Take 200 mg by mouth daily.     traZODone (DESYREL) 100 MG tablet      Vitamin D, Ergocalciferol, (DRISDOL) 1.25 MG (50000 UNIT) CAPS capsule Take 50,000 Units by mouth once a week.     No current facility-administered medications on file prior to visit.    There are no Patient Instructions on file for this visit. No follow-ups on file.   Georgiana Spinner,  NP

## 2022-11-13 LAB — LIPID PANEL
Cholesterol: 196 (ref 0–200)
HDL: 74 — AB (ref 35–70)
LDL Cholesterol: 83
Triglycerides: 239 — AB (ref 40–160)

## 2022-11-13 LAB — HEMOGLOBIN A1C: Hemoglobin A1C: 5.2

## 2022-11-21 DIAGNOSIS — I890 Lymphedema, not elsewhere classified: Secondary | ICD-10-CM | POA: Diagnosis not present

## 2022-11-22 ENCOUNTER — Other Ambulatory Visit: Payer: Self-pay | Admitting: Internal Medicine

## 2022-11-22 ENCOUNTER — Other Ambulatory Visit: Payer: Self-pay | Admitting: Family Medicine

## 2022-11-22 DIAGNOSIS — B354 Tinea corporis: Secondary | ICD-10-CM

## 2022-11-22 DIAGNOSIS — M4727 Other spondylosis with radiculopathy, lumbosacral region: Secondary | ICD-10-CM

## 2022-11-22 DIAGNOSIS — G8929 Other chronic pain: Secondary | ICD-10-CM

## 2022-11-22 NOTE — Telephone Encounter (Signed)
Requested Prescriptions  Pending Prescriptions Disp Refills   celecoxib (CELEBREX) 200 MG capsule [Pharmacy Med Name: CELECOXIB 200 MG CAPSULE] 180 capsule 0    Sig: ONE TO 2 CAPS BY MOUTH DAILY AS NEEDED FOR PAIN.     Analgesics:  COX2 Inhibitors Failed - 11/22/2022  2:12 AM      Failed - Manual Review: Labs are only required if the patient has taken medication for more than 8 weeks.      Passed - HGB in normal range and within 360 days    Hemoglobin  Date Value Ref Range Status  08/31/2022 12.8 11.1 - 15.9 g/dL Final  16/02/9603 54.0 g/dL Final         Passed - Cr in normal range and within 360 days    Creatinine, Ser  Date Value Ref Range Status  09/22/2022 0.94 0.57 - 1.00 mg/dL Final         Passed - HCT in normal range and within 360 days    HCT  Date Value Ref Range Status  07/21/2014 41 % Final   Hematocrit  Date Value Ref Range Status  08/31/2022 38.8 34.0 - 46.6 % Final         Passed - AST in normal range and within 360 days    AST  Date Value Ref Range Status  09/22/2022 19 0 - 40 IU/L Final         Passed - ALT in normal range and within 360 days    ALT  Date Value Ref Range Status  09/22/2022 25 0 - 32 IU/L Final         Passed - eGFR is 30 or above and within 360 days    GFR calc Af Amer  Date Value Ref Range Status  05/31/2018 154 >59 mL/min/1.73 Final   GFR, Estimated  Date Value Ref Range Status  03/27/2022 >60 >60 mL/min Final    Comment:    (NOTE) Calculated using the CKD-EPI Creatinine Equation (2021)    eGFR  Date Value Ref Range Status  09/22/2022 82 >59 mL/min/1.73 Final         Passed - Patient is not pregnant      Passed - Valid encounter within last 12 months    Recent Outpatient Visits           2 months ago Lower extremity edema   [place] [facility], Nyoka Cowden, MD   2 months ago Lower extremity edema   [place] [facility] at [facility], Nyoka Cowden, MD   2 months ago Generalized edema   [place] [facility] at [facility], Ocie Bob, MD   3 months ago Impingement of right shoulder   [place] [facility] at [facility] [place], Ocie Bob, MD   3 months ago Moderate episode of recurrent major depressive disorder [facility])   [place] [facility] at [facility], Nyoka Cowden, MD       Future Appointments             In 7 months Judithann Graves, Nyoka Cowden, MD [facility] at [facility], [facility]

## 2022-12-14 DIAGNOSIS — Z840 Family history of diseases of the skin and subcutaneous tissue: Secondary | ICD-10-CM | POA: Diagnosis not present

## 2022-12-14 DIAGNOSIS — M797 Fibromyalgia: Secondary | ICD-10-CM | POA: Diagnosis not present

## 2022-12-18 ENCOUNTER — Other Ambulatory Visit: Payer: Self-pay | Admitting: Internal Medicine

## 2022-12-18 DIAGNOSIS — R0981 Nasal congestion: Secondary | ICD-10-CM

## 2022-12-21 DIAGNOSIS — I890 Lymphedema, not elsewhere classified: Secondary | ICD-10-CM | POA: Diagnosis not present

## 2023-01-04 ENCOUNTER — Ambulatory Visit: Payer: BC Managed Care – PPO | Admitting: Internal Medicine

## 2023-03-06 ENCOUNTER — Encounter: Payer: Self-pay | Admitting: Internal Medicine

## 2023-03-06 ENCOUNTER — Ambulatory Visit (INDEPENDENT_AMBULATORY_CARE_PROVIDER_SITE_OTHER): Payer: BC Managed Care – PPO | Admitting: Internal Medicine

## 2023-03-06 VITALS — BP 122/78 | HR 74 | Ht 62.0 in | Wt 219.0 lb

## 2023-03-06 DIAGNOSIS — M797 Fibromyalgia: Secondary | ICD-10-CM | POA: Diagnosis not present

## 2023-03-06 DIAGNOSIS — I890 Lymphedema, not elsewhere classified: Secondary | ICD-10-CM

## 2023-03-06 DIAGNOSIS — Z6841 Body Mass Index (BMI) 40.0 and over, adult: Secondary | ICD-10-CM

## 2023-03-06 MED ORDER — TRAZODONE HCL 100 MG PO TABS
100.0000 mg | ORAL_TABLET | Freq: Every day | ORAL | 0 refills | Status: DC
Start: 2023-03-06 — End: 2023-07-13

## 2023-03-06 NOTE — Assessment & Plan Note (Signed)
Tried and failed multiple courses of Phentermine; Semaglutide not covered Has attempted [employer], Keto, Atkins, Slim Fast, intermittent fasting, exercise with no lasting benefit

## 2023-03-06 NOTE — Progress Notes (Signed)
Date:  03/06/2023   Name:  Christine Arellano   DOB:  1991-03-29   MRN:  [id number]   Chief Complaint: Obesity (Patient would like to discuss weight loss surgery.)  HPI Obesity - she has struggled with her weight for years.  She has low self esteem as a result.  Currently having fibromyalgia pain, chronic lymphedema, knee and back pain only partially managed with medications.  She is very interested in the gastric sleeve.  Past efforts include exercise, phentermine, [employer], Atkins, Keto, intermittent fasting, Slim Fast - all with only temporary weight loss.   Review of Systems  Constitutional:  Positive for unexpected weight change. Negative for chills and fatigue.  HENT:  Negative for trouble swallowing.   Respiratory:  Positive for shortness of breath. Negative for chest tightness.   Cardiovascular:  Positive for leg swelling. Negative for chest pain.  Musculoskeletal:  Positive for arthralgias.  Neurological:  Negative for dizziness, light-headedness and headaches.  Psychiatric/Behavioral:  Positive for dysphoric mood and sleep disturbance. The patient is nervous/anxious.      Lab Results  Component Value Date   NA 136 09/22/2022   K 4.2 09/22/2022   CO2 23 09/22/2022   GLUCOSE 82 09/22/2022   BUN 19 09/22/2022   CREATININE 0.94 09/22/2022   CALCIUM 9.7 09/22/2022   EGFR 82 09/22/2022   GFRNONAA >60 03/27/2022   Lab Results  Component Value Date   CHOL 196 11/13/2022   HDL 74 (A) 11/13/2022   LDLCALC 83 11/13/2022   TRIG 239 (A) 11/13/2022   CHOLHDL 3.0 06/30/2022   Lab Results  Component Value Date   TSH 2.080 06/30/2022   Lab Results  Component Value Date   HGBA1C 5.2 11/13/2022   Lab Results  Component Value Date   WBC 7.4 08/31/2022   HGB 12.8 08/31/2022   HCT 38.8 08/31/2022   MCV 89 08/31/2022   PLT 326 08/31/2022   Lab Results  Component Value Date   ALT 25 09/22/2022   AST 19 09/22/2022   ALKPHOS 82 09/22/2022   BILITOT <0.2 09/22/2022   Lab  Results  Component Value Date   VD25OH 61.38 03/27/2022     Patient Active Problem List   Diagnosis Date Noted   Swelling of limb 10/13/2022   Lymphedema 09/01/2022   Impingement of right shoulder 08/28/2022   Mixed hyperlipidemia 06/30/2022   MRI, lumbar spine (05/24/2022) 06/15/2022   Raynaud's disease without gangrene 05/12/2022   Lumbar facet syndrome (Bilateral) (R>L) 04/26/2022    Class: Chronic   Pharmacologic therapy 03/27/2022   Disorder of skeletal system 03/27/2022   Problems influencing health status 03/27/2022   Chronic knee pain (3ry area of Pain) (Bilateral) (R>L) 03/27/2022   Chronic upper back pain (4th area of Pain) (Midline) (Bilateral) 03/27/2022   Chronic shoulder pain (5th area of Pain) (Bilateral) (R>L) 03/27/2022   Chronic hip pain (6th area of Pain) (Bilateral) (R>L) 03/27/2022   Stiffness of right knee 11/16/2021   Fibromyalgia syndrome (7th area of Pain) 09/22/2021   Polyarthralgia 09/22/2021   Chronic sacroiliac joint pain (Bilateral) (R>L) 09/14/2021    Class: Chronic   B12 nutritional deficiency 08/30/2021   Lumbosacral spondylosis with radiculopathy 08/03/2021   Chronic low back pain (1ry area of Pain) (Bilateral) (R>L) w/o sciatica 03/30/2021   BMI 40.0-44.9, adult (HCC) 03/30/2021   Migraine with aura (8th area of Pain) 01/26/2021   Inverted nipple 05/05/2019   Moderate episode of recurrent major depressive disorder (HCC) 05/05/2019   Primary  insomnia 04/15/2019   Generalized anxiety disorder 03/04/2019   Alopecia of scalp 03/04/2019   Dry eye syndrome of both eyes 10/18/2017   Chronic lower extremity pain (2ry area of Pain) (Bilateral) (R>L) 07/06/2016   Adult acne 05/03/2015    No Known Allergies  Past Surgical History:  Procedure Laterality Date   arthroscopic knee Right 2008   ARTHROSCOPIC REPAIR PCL Right 10/14/2021   CESAREAN SECTION  2014   CESAREAN SECTION N/A 02/24/2015   Procedure: CESAREAN SECTION;  Surgeon: Hildred Laser,  MD;  Location: [facility];  Service: Obstetrics;  Laterality: N/A;   CESAREAN SECTION N/A 05/09/2017   Procedure: REPEAT CESAREAN SECTION;  Surgeon: Hildred Laser, MD;  Location: [facility];  Service: Obstetrics;  Laterality: N/A;   HEMORROIDECTOMY     NASAL ENDOSCOPY Bilateral 01/05/2022   Procedure: VIVAER NASAL VALVE REPAIR;  Surgeon: Vernie Murders, MD;  Location: [facility];  Service: ENT;  Laterality: Bilateral;   NASAL TURBINATE REDUCTION Bilateral 01/05/2022   Procedure: INFERIOR TURBINATE REDUCTION;  Surgeon: Vernie Murders, MD;  Location: [facility];  Service: ENT;  Laterality: Bilateral;   SEPTOPLASTY N/A 01/05/2022   Procedure: SEPTOPLASTY;  Surgeon: Vernie Murders, MD;  Location: [facility];  Service: ENT;  Laterality: N/A;   THERAPEUTIC ABORTION  08/2018   fetal CNS abnormalities   TONSILLECTOMY     WISDOM TOOTH EXTRACTION      Social History   Tobacco Use   Smoking status: Former    Current packs/day: 0.00    Types: Cigarettes    Quit date: 08/27/2009    Years since quitting: 13.5   Smokeless tobacco: Never  Vaping Use   Vaping status: Never Used  Substance Use Topics   Alcohol use: Not Currently    Alcohol/week: 0.0 standard drinks of alcohol   Drug use: No     Medication list has been reviewed and updated.  Current Meds  Medication Sig   celecoxib (CELEBREX) 200 MG capsule ONE TO 2 CAPS BY MOUTH DAILY AS NEEDED FOR PAIN.   cyanocobalamin ([employer] VITAMIN B12) 1000 MCG tablet TAKE 1 TABLET BY MOUTH EVERY DAY   fluticasone (FLONASE) 50 MCG/ACT nasal spray SPRAY 2 SPRAYS INTO EACH NOSTRIL EVERY DAY   hydrochlorothiazide (HYDRODIURIL) 25 MG tablet Take 1 tablet (25 mg total) by mouth daily.   hydrOXYzine (VISTARIL) 50 MG capsule SMARTSIG:1 Capsule(s) By Mouth 1-2 Times Daily PRN   minoxidil (LONITEN) 2.5 MG tablet Take 2.5 mg by mouth daily.   norgestimate-ethinyl estradiol (ORTHO-CYCLEN) 0.25-35 MG-MCG tablet Take 1 tablet by mouth daily.    nystatin ointment (MYCOSTATIN) APPLY TO AFFECTED AREA TWICE A DAY   SAVELLA 50 MG TABS tablet Take 50 mg by mouth 2 (two) times daily.   spironolactone (ALDACTONE) 100 MG tablet Take 200 mg by mouth daily.   Vitamin D, Ergocalciferol, (DRISDOL) 1.25 MG (50000 UNIT) CAPS capsule Take 50,000 Units by mouth once a week.   [DISCONTINUED] FLUoxetine HCl 60 MG TABS Take 60 mg by mouth daily.   [DISCONTINUED] pregabalin (LYRICA) 75 MG capsule Take 75 mg by mouth 2 (two) times daily. Rheumatologist   [DISCONTINUED] traZODone (DESYREL) 100 MG tablet        03/06/2023    4:26 PM 09/22/2022    4:24 PM 09/08/2022    3:54 PM 09/01/2022    8:51 AM  GAD 7 : Generalized Anxiety Score  Nervous, Anxious, on Edge 2 2 2 2   Control/stop worrying 2 2 2 2   Worry too much -  different things 3 0 0 1  Trouble relaxing 0 0 0 1  Restless 1 0 0 1  Easily annoyed or irritable 2 0 0 1  Afraid - awful might happen 1 0 0 1  Total GAD 7 Score 11 4 4 9   Anxiety Difficulty Somewhat difficult Somewhat difficult Somewhat difficult Somewhat difficult       03/06/2023    4:25 PM 09/22/2022    4:23 PM 09/08/2022    3:54 PM  Depression screen PHQ 2/9  Decreased Interest 3 0 3  Down, Depressed, Hopeless 2 0 2  PHQ - 2 Score 5 0 5  Altered sleeping 2 0 2  Tired, decreased energy 3 0 3  Change in appetite 3 0 3  Feeling bad or failure about yourself  3 0 3  Trouble concentrating 0 0 1  Moving slowly or fidgety/restless 0 0 2  Suicidal thoughts 0 0 0  PHQ-9 Score 16 0 19  Difficult doing work/chores Somewhat difficult Not difficult at all Extremely dIfficult    BP Readings from Last 3 Encounters:  03/06/23 122/78  11/08/22 113/85  10/13/22 102/70    Physical Exam Vitals and nursing note reviewed.  Constitutional:      General: She is not in acute distress.    Appearance: She is well-developed.  HENT:     Head: Normocephalic and atraumatic.  Cardiovascular:     Rate and Rhythm: Normal rate and regular rhythm.   Pulmonary:     Effort: Pulmonary effort is normal. No respiratory distress.     Breath sounds: No wheezing or rhonchi.  Musculoskeletal:        General: Swelling present.     Cervical back: Normal range of motion.  Lymphadenopathy:     Cervical: No cervical adenopathy.  Skin:    General: Skin is warm and dry.     Capillary Refill: Capillary refill takes less than 2 seconds.     Findings: No rash.  Neurological:     Mental Status: She is alert and oriented to person, place, and time.  Psychiatric:        Mood and Affect: Mood normal.        Behavior: Behavior normal.     Wt Readings from Last 3 Encounters:  03/06/23 219 lb (99.3 kg)  11/08/22 217 lb 12.8 oz (98.8 kg)  10/13/22 212 lb 9.6 oz (96.4 kg)    BP 122/78   Pulse 74   Ht 5\' 2"  (1.575 m)   Wt 219 lb (99.3 kg)   SpO2 97%   BMI 40.06 kg/m   Assessment and Plan:  Problem List Items Addressed This Visit       Unprioritized   BMI 40.0-44.9, adult (HCC)    Tried and failed multiple courses of Phentermine; Semaglutide not covered Has attempted [employer], Keto, Atkins, Slim Fast, intermittent fasting, exercise with no lasting benefit      Fibromyalgia syndrome (7th area of Pain) - Primary (Chronic)   Relevant Medications   traZODone (DESYREL) 100 MG tablet   Lymphedema    No follow-ups on file.    Reubin Milan, MD [facility] Medicine [place]

## 2023-03-19 DIAGNOSIS — Z840 Family history of diseases of the skin and subcutaneous tissue: Secondary | ICD-10-CM | POA: Diagnosis not present

## 2023-03-19 DIAGNOSIS — M797 Fibromyalgia: Secondary | ICD-10-CM | POA: Diagnosis not present

## 2023-03-21 DIAGNOSIS — I890 Lymphedema, not elsewhere classified: Secondary | ICD-10-CM | POA: Diagnosis not present

## 2023-03-22 DIAGNOSIS — M797 Fibromyalgia: Secondary | ICD-10-CM | POA: Diagnosis not present

## 2023-03-22 DIAGNOSIS — M25562 Pain in left knee: Secondary | ICD-10-CM | POA: Diagnosis not present

## 2023-03-22 DIAGNOSIS — M25561 Pain in right knee: Secondary | ICD-10-CM | POA: Diagnosis not present

## 2023-03-29 ENCOUNTER — Other Ambulatory Visit (HOSPITAL_COMMUNITY): Payer: Self-pay | Admitting: General Surgery

## 2023-03-29 DIAGNOSIS — L648 Other androgenic alopecia: Secondary | ICD-10-CM | POA: Diagnosis not present

## 2023-03-29 DIAGNOSIS — M797 Fibromyalgia: Secondary | ICD-10-CM

## 2023-04-23 ENCOUNTER — Encounter (INDEPENDENT_AMBULATORY_CARE_PROVIDER_SITE_OTHER): Payer: Self-pay | Admitting: Nurse Practitioner

## 2023-05-11 ENCOUNTER — Ambulatory Visit (INDEPENDENT_AMBULATORY_CARE_PROVIDER_SITE_OTHER): Payer: BC Managed Care – PPO | Admitting: Nurse Practitioner

## 2023-06-04 ENCOUNTER — Other Ambulatory Visit: Payer: Self-pay | Admitting: Internal Medicine

## 2023-06-04 ENCOUNTER — Other Ambulatory Visit: Payer: Self-pay | Admitting: Family Medicine

## 2023-06-04 DIAGNOSIS — M4727 Other spondylosis with radiculopathy, lumbosacral region: Secondary | ICD-10-CM

## 2023-06-04 DIAGNOSIS — M533 Sacrococcygeal disorders, not elsewhere classified: Secondary | ICD-10-CM

## 2023-06-04 DIAGNOSIS — R0981 Nasal congestion: Secondary | ICD-10-CM

## 2023-06-07 NOTE — Telephone Encounter (Signed)
 Requested by interface surescripts. Future visit in 1 month.  Requested Prescriptions  Pending Prescriptions Disp Refills   fluticasone  (FLONASE ) 50 MCG/ACT nasal spray [Pharmacy Med Name: FLUTICASONE  PROP 50 MCG SPRAY] 16 mL 7    Sig: SPRAY 2 SPRAYS INTO EACH NOSTRIL EVERY DAY     Ear, Nose, and Throat: Nasal Preparations - Corticosteroids Passed - 06/07/2023  8:17 AM      Passed - Valid encounter within last 12 months    Recent Outpatient Visits           3 months ago Fibromyalgia syndrome (7th area of Pain)   [place] [facility] at [facility], Leita DEL, MD   8 months ago Lower extremity edema   [place] [facility] at [facility], Leita DEL, MD   9 months ago Lower extremity edema   [place] [facility] at [facility], Leita DEL, MD   9 months ago Generalized edema   [place] [facility] at [facility], Selinda PARAS, MD   9 months ago Impingement of right shoulder   [facility] Medicine at [facility], Selinda PARAS, MD       Future Appointments             In 1 month Justus, Leita DEL, MD [facility] at [facility], [facility]

## 2023-06-07 NOTE — Telephone Encounter (Signed)
 Requested Prescriptions  Pending Prescriptions Disp Refills   celecoxib  (CELEBREX ) 200 MG capsule [Pharmacy Med Name: CELECOXIB  200 MG CAPSULE] 60 capsule 2    Sig: ONE TO 2 CAPS BY MOUTH DAILY AS NEEDED FOR PAIN.     Analgesics:  COX2 Inhibitors Failed - 06/07/2023  9:05 AM      Failed - Manual Review: Labs are only required if the patient has taken medication for more than 8 weeks.      Passed - HGB in normal range and within 360 days    Hemoglobin  Date Value Ref Range Status  08/31/2022 12.8 11.1 - 15.9 g/dL Final  97/76/7983 86.1 g/dL Final         Passed - Cr in normal range and within 360 days    Creatinine, Ser  Date Value Ref Range Status  09/22/2022 0.94 0.57 - 1.00 mg/dL Final         Passed - HCT in normal range and within 360 days    HCT  Date Value Ref Range Status  07/21/2014 41 % Final   Hematocrit  Date Value Ref Range Status  08/31/2022 38.8 34.0 - 46.6 % Final         Passed - AST in normal range and within 360 days    AST  Date Value Ref Range Status  09/22/2022 19 0 - 40 IU/L Final         Passed - ALT in normal range and within 360 days    ALT  Date Value Ref Range Status  09/22/2022 25 0 - 32 IU/L Final         Passed - eGFR is 30 or above and within 360 days    GFR calc Af Amer  Date Value Ref Range Status  05/31/2018 154 >59 mL/min/1.73 Final   GFR, Estimated  Date Value Ref Range Status  03/27/2022 >60 >60 mL/min Final    Comment:    (NOTE) Calculated using the CKD-EPI Creatinine Equation (2021)    eGFR  Date Value Ref Range Status  09/22/2022 82 >59 mL/min/1.73 Final         Passed - Patient is not pregnant      Passed - Valid encounter within last 12 months    Recent Outpatient Visits           3 months ago Fibromyalgia syndrome (7th area of Pain)   [place] [facility] at [facility], Leita DEL, MD   8 months ago Lower extremity edema   [place] [facility] at  [facility], Leita DEL, MD   9 months ago Lower extremity edema   [place] [facility] at [facility], Leita DEL, MD   9 months ago Generalized edema    Primary Care & Sports Medicine at [facility], Selinda PARAS, MD   9 months ago Impingement of right shoulder   [facility] at [facility], Selinda PARAS, MD       Future Appointments             In 1 month Justus, Leita DEL, MD [facility] Primary Care & Sports Medicine at [facility], [facility]

## 2023-07-13 ENCOUNTER — Encounter: Payer: Self-pay | Admitting: Internal Medicine

## 2023-07-13 ENCOUNTER — Ambulatory Visit (INDEPENDENT_AMBULATORY_CARE_PROVIDER_SITE_OTHER): Payer: Medicaid Other | Admitting: Internal Medicine

## 2023-07-13 VITALS — BP 102/72 | HR 70 | Ht 62.0 in | Wt 227.0 lb

## 2023-07-13 DIAGNOSIS — R600 Localized edema: Secondary | ICD-10-CM | POA: Diagnosis not present

## 2023-07-13 DIAGNOSIS — Z308 Encounter for other contraceptive management: Secondary | ICD-10-CM | POA: Diagnosis not present

## 2023-07-13 DIAGNOSIS — J0100 Acute maxillary sinusitis, unspecified: Secondary | ICD-10-CM

## 2023-07-13 DIAGNOSIS — I890 Lymphedema, not elsewhere classified: Secondary | ICD-10-CM | POA: Diagnosis not present

## 2023-07-13 DIAGNOSIS — Z0000 Encounter for general adult medical examination without abnormal findings: Secondary | ICD-10-CM | POA: Diagnosis not present

## 2023-07-13 DIAGNOSIS — Z131 Encounter for screening for diabetes mellitus: Secondary | ICD-10-CM | POA: Diagnosis not present

## 2023-07-13 DIAGNOSIS — M797 Fibromyalgia: Secondary | ICD-10-CM

## 2023-07-13 DIAGNOSIS — E782 Mixed hyperlipidemia: Secondary | ICD-10-CM | POA: Diagnosis not present

## 2023-07-13 DIAGNOSIS — F331 Major depressive disorder, recurrent, moderate: Secondary | ICD-10-CM

## 2023-07-13 DIAGNOSIS — L659 Nonscarring hair loss, unspecified: Secondary | ICD-10-CM | POA: Diagnosis not present

## 2023-07-13 DIAGNOSIS — Z6841 Body Mass Index (BMI) 40.0 and over, adult: Secondary | ICD-10-CM

## 2023-07-13 MED ORDER — HYDROCHLOROTHIAZIDE 25 MG PO TABS
25.0000 mg | ORAL_TABLET | Freq: Every day | ORAL | 3 refills | Status: AC
Start: 2023-07-13 — End: ?

## 2023-07-13 MED ORDER — TRAZODONE HCL 100 MG PO TABS: 100.0000 mg | ORAL_TABLET | Freq: Every day | ORAL | 0 refills | Status: DC

## 2023-07-13 MED ORDER — NORGESTIMATE-ETH ESTRADIOL 0.25-35 MG-MCG PO TABS: 1.0000 | ORAL_TABLET | Freq: Every day | ORAL | 3 refills | Status: AC

## 2023-07-13 MED ORDER — AZITHROMYCIN 250 MG PO TABS: ORAL_TABLET | ORAL | 0 refills | Status: AC

## 2023-07-13 NOTE — Assessment & Plan Note (Signed)
Managed with elevation and PRN hctz

## 2023-07-13 NOTE — Assessment & Plan Note (Signed)
Was taking Savella but new insurance won't cover it. She has been referred to [facility]

## 2023-07-13 NOTE — Assessment & Plan Note (Signed)
No longer taking Prozac or seeing psych Will refill trazodone to use PRN Overall doing better from a depression standpoint since children's father is out of the house.

## 2023-07-13 NOTE — Progress Notes (Signed)
Date:  07/13/2023   Name:  Christine Arellano   DOB:  03-10-1991   MRN:  [id number]   Chief Complaint: Annual Exam Christine Arellano is a 33 y.o. female who presents today for her Complete Annual Exam. She feels well. She reports exercising none. She reports she is sleeping well. Breast complaints none.  Health Maintenance  Topic Date Due   COVID-19 Vaccine (1) Never done   Flu Shot  08/27/2023*   Pap with HPV screening  06/29/2026   DTaP/Tdap/Td vaccine (3 - Td or Tdap) 02/23/2027   Hepatitis C Screening  Completed   HIV Screening  Completed   HPV Vaccine  Aged Out  *Topic was postponed. The date shown is not the original due date.      HPI  Review of Systems  Constitutional:  Positive for fatigue. Negative for chills and fever.  HENT:  Negative for congestion, hearing loss, tinnitus, trouble swallowing and voice change.   Eyes:  Negative for visual disturbance.  Respiratory:  Negative for cough, chest tightness, shortness of breath and wheezing.   Cardiovascular:  Negative for chest pain, palpitations and leg swelling.  Gastrointestinal:  Negative for abdominal pain, constipation, diarrhea and vomiting.  Endocrine: Negative for polydipsia and polyuria.  Genitourinary:  Negative for dysuria, frequency, genital sores, vaginal bleeding and vaginal discharge.  Musculoskeletal:  Positive for arthralgias and myalgias. Negative for gait problem and joint swelling.  Skin:  Negative for color change and rash.  Neurological:  Negative for dizziness, tremors, light-headedness and headaches.  Hematological:  Negative for adenopathy. Does not bruise/bleed easily.  Psychiatric/Behavioral:  Positive for dysphoric mood and sleep disturbance. The patient is nervous/anxious.      Lab Results  Component Value Date   NA 136 09/22/2022   K 4.2 09/22/2022   CO2 23 09/22/2022   GLUCOSE 82 09/22/2022   BUN 19 09/22/2022   CREATININE 0.94 09/22/2022   CALCIUM 9.7 09/22/2022   EGFR 82 09/22/2022    GFRNONAA >60 03/27/2022   Lab Results  Component Value Date   CHOL 196 11/13/2022   HDL 74 (A) 11/13/2022   LDLCALC 83 11/13/2022   TRIG 239 (A) 11/13/2022   CHOLHDL 3.0 06/30/2022   Lab Results  Component Value Date   TSH 2.080 06/30/2022   Lab Results  Component Value Date   HGBA1C 5.2 11/13/2022   Lab Results  Component Value Date   WBC 7.4 08/31/2022   HGB 12.8 08/31/2022   HCT 38.8 08/31/2022   MCV 89 08/31/2022   PLT 326 08/31/2022   Lab Results  Component Value Date   ALT 25 09/22/2022   AST 19 09/22/2022   ALKPHOS 82 09/22/2022   BILITOT <0.2 09/22/2022   Lab Results  Component Value Date   VD25OH 61.38 03/27/2022     Patient Active Problem List   Diagnosis Date Noted   Swelling of limb 10/13/2022   Lymphedema 09/01/2022   Impingement of right shoulder 08/28/2022   Mixed hyperlipidemia 06/30/2022   Raynaud's disease without gangrene 05/12/2022   Lumbar facet syndrome (Bilateral) (R>L) 04/26/2022    Class: Chronic   Chronic knee pain (3ry area of Pain) (Bilateral) (R>L) 03/27/2022   Chronic upper back pain (4th area of Pain) (Midline) (Bilateral) 03/27/2022   Chronic shoulder pain (5th area of Pain) (Bilateral) (R>L) 03/27/2022   Chronic hip pain (6th area of Pain) (Bilateral) (R>L) 03/27/2022   Stiffness of right knee 11/16/2021   Fibromyalgia syndrome (7th area of Pain)  09/22/2021   Polyarthralgia 09/22/2021   Chronic sacroiliac joint pain (Bilateral) (R>L) 09/14/2021    Class: Chronic   B12 nutritional deficiency 08/30/2021   Lumbosacral spondylosis with radiculopathy 08/03/2021   Chronic low back pain (1ry area of Pain) (Bilateral) (R>L) w/o sciatica 03/30/2021   BMI 40.0-44.9, adult (HCC) 03/30/2021   Migraine with aura (8th area of Pain) 01/26/2021   Inverted nipple 05/05/2019   Moderate episode of recurrent major depressive disorder (HCC) 05/05/2019   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   Generalized anxiety disorder 03/04/2019   Alopecia of  scalp 03/04/2019   Dry eye syndrome of both eyes 10/18/2017   Chronic lower extremity pain (2ry area of Pain) (Bilateral) (R>L) 07/06/2016   Adult acne 05/03/2015    No Known Allergies  Past Surgical History:  Procedure Laterality Date   arthroscopic knee Right 2008   ARTHROSCOPIC REPAIR PCL Right 10/14/2021   CESAREAN SECTION  2014   CESAREAN SECTION N/A 02/24/2015   Procedure: CESAREAN SECTION;  Surgeon: Hildred Laser, MD;  Location: [facility];  Service: Obstetrics;  Laterality: N/A;   CESAREAN SECTION N/A 05/09/2017   Procedure: REPEAT CESAREAN SECTION;  Surgeon: Hildred Laser, MD;  Location: [facility];  Service: Obstetrics;  Laterality: N/A;   HEMORROIDECTOMY     NASAL ENDOSCOPY Bilateral 01/05/2022   Procedure: VIVAER NASAL VALVE REPAIR;  Surgeon: Vernie Murders, MD;  Location: [facility];  Service: ENT;  Laterality: Bilateral;   NASAL TURBINATE REDUCTION Bilateral 01/05/2022   Procedure: INFERIOR TURBINATE REDUCTION;  Surgeon: Vernie Murders, MD;  Location: [facility];  Service: ENT;  Laterality: Bilateral;   SEPTOPLASTY N/A 01/05/2022   Procedure: SEPTOPLASTY;  Surgeon: Vernie Murders, MD;  Location: [facility];  Service: ENT;  Laterality: N/A;   THERAPEUTIC ABORTION  08/2018   fetal CNS abnormalities   TONSILLECTOMY     WISDOM TOOTH EXTRACTION      Social History   Tobacco Use   Smoking status: Former    Current packs/day: 0.00    Types: Cigarettes    Quit date: 08/27/2009    Years since quitting: 13.8   Smokeless tobacco: Never  Vaping Use   Vaping status: Never Used  Substance Use Topics   Alcohol use: Not Currently    Alcohol/week: 0.0 standard drinks of alcohol   Drug use: No     Medication list has been reviewed and updated.  Current Meds  Medication Sig   azithromycin (ZITHROMAX Z-PAK) 250 MG tablet UAD   minoxidil (LONITEN) 2.5 MG tablet Take 2.5 mg by mouth daily.   spironolactone (ALDACTONE) 100 MG tablet Take 200 mg by mouth  daily.   [DISCONTINUED] norgestimate-ethinyl estradiol (ORTHO-CYCLEN) 0.25-35 MG-MCG tablet Take 1 tablet by mouth daily.       07/13/2023    8:24 AM 03/06/2023    4:26 PM 09/22/2022    4:24 PM 09/08/2022    3:54 PM  GAD 7 : Generalized Anxiety Score  Nervous, Anxious, on Edge 3 2 2 2   Control/stop worrying 2 2 2 2   Worry too much - different things 2 3 0 0  Trouble relaxing 2 0 0 0  Restless 2 1 0 0  Easily annoyed or irritable 2 2 0 0  Afraid - awful might happen 1 1 0 0  Total GAD 7 Score 14 11 4 4   Anxiety Difficulty Somewhat difficult Somewhat difficult Somewhat difficult Somewhat difficult       07/13/2023    8:24 AM 03/06/2023    4:25 PM  09/22/2022    4:23 PM  Depression screen PHQ 2/9  Decreased Interest 2 3 0  Down, Depressed, Hopeless 1 2 0  PHQ - 2 Score 3 5 0  Altered sleeping 2 2 0  Tired, decreased energy 2 3 0  Change in appetite 2 3 0  Feeling bad or failure about yourself  1 3 0  Trouble concentrating 0 0 0  Moving slowly or fidgety/restless 0 0 0  Suicidal thoughts 0 0 0  PHQ-9 Score 10 16 0  Difficult doing work/chores Somewhat difficult Somewhat difficult Not difficult at all    BP Readings from Last 3 Encounters:  07/13/23 102/72  03/06/23 122/78  11/08/22 113/85    Physical Exam Vitals and nursing note reviewed.  Constitutional:      General: She is not in acute distress.    Appearance: She is well-developed.  HENT:     Head: Normocephalic and atraumatic.     Right Ear: Tympanic membrane and ear canal normal.     Left Ear: Tympanic membrane and ear canal normal.     Nose:     Right Sinus: No maxillary sinus tenderness.     Left Sinus: No maxillary sinus tenderness.  Eyes:     General: No scleral icterus.       Right eye: No discharge.        Left eye: No discharge.     Conjunctiva/sclera: Conjunctivae normal.  Neck:     Thyroid: No thyromegaly.     Vascular: No carotid bruit.  Cardiovascular:     Rate and Rhythm: Normal rate and  regular rhythm.     Pulses: Normal pulses.     Heart sounds: Normal heart sounds.  Pulmonary:     Effort: Pulmonary effort is normal. No respiratory distress.     Breath sounds: No wheezing.  Chest:  Breasts:    Right: Inverted nipple present. No mass, nipple discharge, skin change or tenderness.     Left: Inverted nipple present. No mass, nipple discharge, skin change or tenderness.  Abdominal:     General: Bowel sounds are normal.     Palpations: Abdomen is soft.     Tenderness: There is no abdominal tenderness.  Musculoskeletal:     Cervical back: Normal range of motion. No erythema.     Right lower leg: No edema.     Left lower leg: No edema.  Lymphadenopathy:     Cervical: No cervical adenopathy.  Skin:    General: Skin is warm and dry.     Findings: No rash.  Neurological:     Mental Status: She is alert and oriented to person, place, and time.     Cranial Nerves: No cranial nerve deficit.     Sensory: No sensory deficit.     Deep Tendon Reflexes: Reflexes are normal and symmetric.  Psychiatric:        Attention and Perception: Attention normal.        Mood and Affect: Mood normal.     Wt Readings from Last 3 Encounters:  07/13/23 227 lb (103 kg)  03/06/23 219 lb (99.3 kg)  11/08/22 217 lb 12.8 oz (98.8 kg)    BP 102/72   Pulse 70   Ht 5\' 2"  (1.575 m)   Wt 227 lb (103 kg)   SpO2 98%   BMI 41.52 kg/m   Assessment and Plan:  Problem List Items Addressed This Visit       Unprioritized   Moderate episode  of recurrent major depressive disorder (HCC) (Chronic)   No longer taking Prozac or seeing psych Will refill trazodone to use PRN Overall doing better from a depression standpoint since children's father is out of the house.      Relevant Medications   traZODone (DESYREL) 100 MG tablet   Other Relevant Orders   TSH   Fibromyalgia syndrome (7th area of Pain) (Chronic)   Was taking Savella but new insurance won't cover it. She has been [facility] to  [facility]      Relevant Medications   traZODone (DESYREL) 100 MG tablet   Other Relevant Orders   CBC with Differential/Platelet   Alopecia of scalp   On minoxidil and spironolactone from dermatology      BMI 40.0-44.9, adult [facility])   She was considering bariatric surgery with her [employer] insurance but reverted back to medicaid to save money and now its not covered      Mixed hyperlipidemia   Relevant Medications   hydrochlorothiazide (HYDRODIURIL) 25 MG tablet   Other Relevant Orders   Lipid panel   TSH   Lymphedema   Managed with elevation and PRN hctz      Relevant Medications   hydrochlorothiazide (HYDRODIURIL) 25 MG tablet   Other Visit Diagnoses       Annual physical exam    -  Primary   Pap due in 2027   Relevant Orders   CBC with Differential/Platelet   Comprehensive metabolic panel   Hemoglobin A1c   Lipid panel   TSH     Screening for diabetes mellitus       Relevant Orders   Comprehensive metabolic panel   Hemoglobin A1c     Subacute maxillary sinusitis       Relevant Medications   azithromycin (ZITHROMAX Z-PAK) 250 MG tablet     Encounter for other contraceptive management       Relevant Medications   norgestimate-ethinyl estradiol (ORTHO-CYCLEN) 0.25-35 MG-MCG tablet     Lower extremity edema       Relevant Medications   hydrochlorothiazide (HYDRODIURIL) 25 MG tablet       No follow-ups on file.    Reubin Milan, MD [facility] [place]

## 2023-07-13 NOTE — Assessment & Plan Note (Signed)
She was considering bariatric surgery with her [employer] insurance but reverted back to medicaid to save money and now its not covered

## 2023-07-13 NOTE — Assessment & Plan Note (Signed)
On minoxidil and spironolactone from dermatology

## 2023-07-14 ENCOUNTER — Encounter: Payer: Self-pay | Admitting: Internal Medicine

## 2023-07-14 LAB — COMPREHENSIVE METABOLIC PANEL
ALT: 13 [IU]/L (ref 0–32)
AST: 17 [IU]/L (ref 0–40)
Albumin: 4.4 g/dL (ref 3.9–4.9)
Alkaline Phosphatase: 67 [IU]/L (ref 44–121)
BUN/Creatinine Ratio: 8 — ABNORMAL LOW (ref 9–23)
BUN: 6 mg/dL (ref 6–20)
Bilirubin Total: <0.2 mg/dL (ref 0.0–1.2)
CO2: 22 mmol/L (ref 20–29)
Calcium: 9.3 mg/dL (ref 8.7–10.2)
Chloride: 100 mmol/L (ref 96–106)
Creatinine, Ser: 0.76 mg/dL (ref 0.57–1.00)
Globulin, Total: 2.4 g/dL (ref 1.5–4.5)
Glucose: 78 mg/dL (ref 70–99)
Potassium: 4.3 mmol/L (ref 3.5–5.2)
Sodium: 137 mmol/L (ref 134–144)
Total Protein: 6.8 g/dL (ref 6.0–8.5)
eGFR: 105 mL/min/{1.73_m2} (ref >59)

## 2023-07-14 LAB — CBC WITH DIFFERENTIAL/PLATELET
Basophils Absolute: 0 10*3/uL (ref 0.0–0.2)
Basos: 0 %
EOS (ABSOLUTE): 0 10*3/uL (ref 0.0–0.4)
Eos: 0 %
Hematocrit: 41.9 % (ref 34.0–46.6)
Hemoglobin: 13.6 g/dL (ref 11.1–15.9)
Immature Grans (Abs): 0 10*3/uL (ref 0.0–0.1)
Immature Granulocytes: 0 %
Lymphocytes Absolute: 1.3 10*3/uL (ref 0.7–3.1)
Lymphs: 31 %
MCH: 27.8 pg (ref 26.6–33.0)
MCHC: 32.5 g/dL (ref 31.5–35.7)
MCV: 86 fL (ref 79–97)
Monocytes Absolute: 0.5 10*3/uL (ref 0.1–0.9)
Monocytes: 11 %
Neutrophils Absolute: 2.4 10*3/uL (ref 1.4–7.0)
Neutrophils: 58 %
Platelets: 265 10*3/uL (ref 150–450)
RBC: 4.89 x10E6/uL (ref 3.77–5.28)
RDW: 12.7 % (ref 11.7–15.4)
WBC: 4.2 10*3/uL (ref 3.4–10.8)

## 2023-07-14 LAB — LIPID PANEL
Chol/HDL Ratio: 4 {ratio} (ref 0.0–4.4)
Cholesterol, Total: 207 mg/dL — ABNORMAL HIGH (ref 100–199)
HDL: 52 mg/dL (ref >39)
LDL Chol Calc (NIH): 126 mg/dL — ABNORMAL HIGH (ref 0–99)
Triglycerides: 164 mg/dL — ABNORMAL HIGH (ref 0–149)
VLDL Cholesterol Cal: 29 mg/dL (ref 5–40)

## 2023-07-14 LAB — TSH: TSH: 1.75 u[IU]/mL (ref 0.450–4.500)

## 2023-07-14 LAB — HEMOGLOBIN A1C
Est. average glucose Bld gHb Est-mCnc: 100 mg/dL
Hgb A1c MFr Bld: 5.1 % (ref 4.8–5.6)

## 2023-07-31 ENCOUNTER — Other Ambulatory Visit: Payer: Self-pay | Admitting: Internal Medicine

## 2023-07-31 DIAGNOSIS — B354 Tinea corporis: Secondary | ICD-10-CM

## 2023-08-01 NOTE — Telephone Encounter (Signed)
 Requested medication (s) are due for refill today - no  Requested medication (s) are on the active medication list -no  Future visit scheduled -yes  Last refill: unknown  Notes to clinic: off protocol- provider review , no longer listed on current medication list   Requested Prescriptions  Pending Prescriptions Disp Refills   nystatin ointment (MYCOSTATIN) [Pharmacy Med Name: NYSTATIN 100,000 UNIT/GM OINT] 30 g 0    Sig: APPLY TO AFFECTED AREA TWICE A DAY     Off-Protocol Failed - 08/01/2023  9:49 AM      Failed - Medication not assigned to a protocol, review manually.      Passed - Valid encounter within last 12 months    Recent Outpatient Visits           4 months ago Fibromyalgia syndrome (7th area of Pain)   [place] [facility] at [facility], Nyoka Cowden, MD   10 months ago Lower extremity edema   [place] [facility] at [facility], Nyoka Cowden, MD   11 months ago Lower extremity edema   [place] [facility] at [facility], Nyoka Cowden, MD   11 months ago Generalized edema   [place] [facility] at [facility], Ocie Bob, MD   11 months ago Impingement of right shoulder   [place] [facility] at [facility], Ocie Bob, MD       Future Appointments             In 11 months Quincy Simmonds, MD [facility] Medicine at [facility], [facility]               Requested Prescriptions  Pending Prescriptions Disp Refills   nystatin ointment (MYCOSTATIN) [Pharmacy Med Name: NYSTATIN 100,000 UNIT/GM OINT] 30 g 0    Sig: APPLY TO AFFECTED AREA TWICE A DAY     Off-Protocol Failed - 08/01/2023  9:49 AM      Failed - Medication not assigned to a protocol, review manually.      Passed - Valid encounter within last 12 months    Recent Outpatient Visits           4 months ago Fibromyalgia  syndrome (7th area of Pain)   [place] [facility] at [facility], Nyoka Cowden, MD   10 months ago Lower extremity edema   [place] [facility] at [facility], Nyoka Cowden, MD   11 months ago Lower extremity edema   [place] [facility] at [facility], Nyoka Cowden, MD   11 months ago Generalized edema    Primary Care & Sports Medicine at [facility], Ocie Bob, MD   11 months ago Impingement of right shoulder   [facility] at [facility], Ocie Bob, MD       Future Appointments             In 11 months Quincy Simmonds, MD [facility] at [facility], [facility]

## 2023-08-01 NOTE — Telephone Encounter (Signed)
 Med refill

## 2023-09-26 DIAGNOSIS — L650 Telogen effluvium: Secondary | ICD-10-CM | POA: Diagnosis not present

## 2023-09-26 DIAGNOSIS — L648 Other androgenic alopecia: Secondary | ICD-10-CM | POA: Diagnosis not present

## 2023-09-26 DIAGNOSIS — L309 Dermatitis, unspecified: Secondary | ICD-10-CM | POA: Diagnosis not present

## 2023-10-04 DIAGNOSIS — M797 Fibromyalgia: Secondary | ICD-10-CM | POA: Diagnosis not present

## 2023-10-04 DIAGNOSIS — G894 Chronic pain syndrome: Secondary | ICD-10-CM | POA: Diagnosis not present

## 2023-10-11 ENCOUNTER — Other Ambulatory Visit: Payer: Self-pay | Admitting: Internal Medicine

## 2023-10-11 DIAGNOSIS — M797 Fibromyalgia: Secondary | ICD-10-CM

## 2023-10-12 NOTE — Telephone Encounter (Signed)
 Requested Prescriptions  Pending Prescriptions Disp Refills   traZODone  (DESYREL ) 100 MG tablet [Pharmacy Med Name: TRAZODONE  100 MG TABLET] 90 tablet 2    Sig: TAKE 1 TABLET BY MOUTH EVERYDAY AT BEDTIME     Psychiatry: Antidepressants - Serotonin Modulator Passed - 10/12/2023 11:11 AM      Passed - Completed PHQ-2 or PHQ-9 in the last 360 days      Passed - Valid encounter within last 6 months    Recent Outpatient Visits           3 months ago Annual physical exam   [facility] at [facility], Chales [place], MD       Future Appointments             In 9 months Christine Liberty, MD [facility] Medicine at [facility], [facility]

## 2023-10-16 ENCOUNTER — Encounter (INDEPENDENT_AMBULATORY_CARE_PROVIDER_SITE_OTHER): Payer: Self-pay

## 2023-10-17 DIAGNOSIS — L650 Telogen effluvium: Secondary | ICD-10-CM | POA: Diagnosis not present

## 2023-11-01 ENCOUNTER — Telehealth: Admitting: Family Medicine

## 2023-11-01 DIAGNOSIS — J019 Acute sinusitis, unspecified: Secondary | ICD-10-CM | POA: Diagnosis not present

## 2023-11-01 DIAGNOSIS — B9689 Other specified bacterial agents as the cause of diseases classified elsewhere: Secondary | ICD-10-CM

## 2023-11-01 MED ORDER — AMOXICILLIN-POT CLAVULANATE 875-125 MG PO TABS: 1.0000 | ORAL_TABLET | Freq: Two times a day (BID) | ORAL | 0 refills | Status: AC

## 2023-11-01 NOTE — Progress Notes (Signed)

## 2023-11-07 ENCOUNTER — Ambulatory Visit: Payer: Self-pay

## 2023-11-07 NOTE — Telephone Encounter (Signed)
 FYI Only or Action Required?: FYI only for provider  Patient was last seen in primary care on 07/13/2023 by Sheron Dixons, MD. Called Nurse Triage reporting Urinary Symptoms . Symptoms began a week ago. Interventions attempted: Prescription medications: Amoxicillin  and Rest, hydration, or home remedies. Symptoms are: gradually worsening.  Triage Disposition: See Physician Within 24 Hours  Patient/caregiver understands and will follow disposition?: Yes  Copied from CRM [id number]. Topic: Clinical - Red Word Triage >> Nov 07, 2023  9:32 AM Christine Arellano wrote: Red Word that prompted transfer to Nurse Triage: Possible uti, burning, stinging and itching.   Reason for Disposition  Urinating more frequently than usual (i.e., frequency)  Answer Assessment - Initial Assessment Questions 1. SYMPTOM: What's the main symptom you're concerned about? (e.g., frequency, incontinence)     Dysuria, Soreness, Itching  2. ONSET: When did the  symptoms  start?     Over a week  3. PAIN: Is there any pain? If Yes, ask: How bad is it? (Scale: 1-10; mild, moderate, severe)      Mild when not touched, Severe when wiping or touching  4. CAUSE: What do you think is causing the symptoms?     Unsure  5. OTHER SYMPTOMS: Do you have any other symptoms? (e.g., blood in urine, fever, flank pain, pain with urination)     Vaginal Discharge (Off White, Yellow, Thick), Swollen Glands,   6. PREGNANCY: Is there any chance you are pregnant? When was your last menstrual period?     Last Wednesday, LMP  Reports having taken Amoxicillin  before symptom onset, and being recently treated for a sinus infection with Amoxicillin   Protocols used: Urinary Symptoms-A-AH

## 2023-11-07 NOTE — Telephone Encounter (Signed)
Noted   Pt has an appointment.  KP

## 2023-11-08 ENCOUNTER — Other Ambulatory Visit (HOSPITAL_COMMUNITY)
Admission: RE | Admit: 2023-11-08 | Discharge: 2023-11-08 | Disposition: A | Discharge status: Home / Self Care | Source: Ambulatory Visit | Attending: Internal Medicine | Admitting: Internal Medicine

## 2023-11-08 ENCOUNTER — Encounter: Payer: Self-pay | Admitting: Internal Medicine

## 2023-11-08 ENCOUNTER — Ambulatory Visit: Admitting: Internal Medicine

## 2023-11-08 VITALS — BP 122/76 | HR 94 | Ht 62.0 in | Wt 197.5 lb

## 2023-11-08 DIAGNOSIS — N898 Other specified noninflammatory disorders of vagina: Secondary | ICD-10-CM

## 2023-11-08 DIAGNOSIS — H04123 Dry eye syndrome of bilateral lacrimal glands: Secondary | ICD-10-CM

## 2023-11-08 LAB — POCT URINALYSIS DIPSTICK
Bilirubin, UA: NEGATIVE
Blood, UA: NEGATIVE
Glucose, UA: NEGATIVE
Ketones, UA: NEGATIVE
Nitrite, UA: NEGATIVE
Protein, UA: NEGATIVE
Spec Grav, UA: 1.01 (ref 1.010–1.025)
Urobilinogen, UA: 0.2 U/dL
pH, UA: 6.5 (ref 5.0–8.0)

## 2023-11-08 MED ORDER — FLUCONAZOLE 100 MG PO TABS: 100.0000 mg | ORAL_TABLET | Freq: Every day | ORAL | 0 refills | Status: AC

## 2023-11-08 MED ORDER — XIIDRA 5 % OP SOLN: 1.0000 [drp] | Freq: Two times a day (BID) | OPHTHALMIC | 0 refills | Status: DC

## 2023-11-08 NOTE — Progress Notes (Signed)
 Date:  11/08/2023   Name:  Christine Arellano   DOB:  [birth year]   MRN:  [id number]   Chief Complaint: Vaginal Discharge (X 1 week) and Dysuria  Vaginal Discharge The patient's primary symptoms include vaginal discharge. The patient's pertinent negatives include no pelvic pain. The current episode started in the past 7 days. The problem occurs constantly. The pain is mild. The problem affects both sides. She is not pregnant. Associated symptoms include dysuria. Pertinent negatives include no chills, fever, headaches or hematuria.  Dysuria  This is a new problem. The current episode started in the past 7 days. The problem occurs every urination. The patient is experiencing no pain. Pertinent negatives include no chills or hematuria.    Review of Systems  Constitutional:  Negative for chills, fatigue and fever.  Eyes:  Positive for visual disturbance (dry eyes).  Respiratory:  Negative for chest tightness and shortness of breath.   Cardiovascular:  Negative for chest pain.  Genitourinary:  Positive for dysuria and vaginal discharge. Negative for genital sores, hematuria and pelvic pain.  Neurological:  Negative for dizziness and headaches.  Hematological:  Negative for adenopathy.     Lab Results  Component Value Date   NA 137 07/13/2023   K 4.3 07/13/2023   CO2 22 07/13/2023   GLUCOSE 78 07/13/2023   BUN 6 07/13/2023   CREATININE 0.76 07/13/2023   CALCIUM 9.3 07/13/2023   EGFR 105 07/13/2023   GFRNONAA >60 03/27/2022   Lab Results  Component Value Date   CHOL 207 (H) 07/13/2023   HDL 52 07/13/2023   LDLCALC 126 (H) 07/13/2023   TRIG 164 (H) 07/13/2023   CHOLHDL 4.0 07/13/2023   Lab Results  Component Value Date   TSH 1.750 07/13/2023   Lab Results  Component Value Date   HGBA1C 5.1 07/13/2023   Lab Results  Component Value Date   WBC 4.2 07/13/2023   HGB 13.6 07/13/2023   HCT 41.9 07/13/2023   MCV 86 07/13/2023   PLT 265 07/13/2023   Lab Results  Component  Value Date   ALT 13 07/13/2023   AST 17 07/13/2023   ALKPHOS 67 07/13/2023   BILITOT <0.2 07/13/2023   Lab Results  Component Value Date   VD25OH 61.38 03/27/2022     Patient Active Problem List   Diagnosis Date Noted   Swelling of limb 10/13/2022   Lymphedema 09/01/2022   Impingement of right shoulder 08/28/2022   Mixed hyperlipidemia 06/30/2022   Raynaud's disease without gangrene 05/12/2022   Lumbar facet syndrome (Bilateral) (R>L) 04/26/2022    Class: Chronic   Chronic knee pain (3ry area of Pain) (Bilateral) (R>L) 03/27/2022   Chronic upper back pain (4th area of Pain) (Midline) (Bilateral) 03/27/2022   Chronic shoulder pain (5th area of Pain) (Bilateral) (R>L) 03/27/2022   Chronic hip pain (6th area of Pain) (Bilateral) (R>L) 03/27/2022   Stiffness of right knee 11/16/2021   Fibromyalgia syndrome (7th area of Pain) 09/22/2021   Polyarthralgia 09/22/2021   Chronic sacroiliac joint pain (Bilateral) (R>L) 09/14/2021    Class: Chronic   B12 nutritional deficiency 08/30/2021   Lumbosacral spondylosis with radiculopathy 08/03/2021   Chronic low back pain (1ry area of Pain) (Bilateral) (R>L) w/o sciatica 03/30/2021   BMI 40.0-44.9, adult (HCC) 03/30/2021   Migraine with aura (8th area of Pain) 01/26/2021   Inverted nipple 05/05/2019   Moderate episode of recurrent major depressive disorder (HCC) 05/05/2019   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   Generalized anxiety disorder  03/04/2019   Alopecia of scalp 03/04/2019   Chronic dryness of both eyes 10/18/2017   Chronic lower extremity pain (2ry area of Pain) (Bilateral) (R>L) 07/06/2016   Adult acne 05/03/2015    No Known Allergies  Past Surgical History:  Procedure Laterality Date   arthroscopic knee Right 2008   ARTHROSCOPIC REPAIR PCL Right 10/14/2021   CESAREAN SECTION  2014   CESAREAN SECTION N/A 02/24/2015   Procedure: CESAREAN SECTION;  Surgeon: Teresa Fender, MD;  Location: [facility] OR[facility];  Service: Obstetrics;  Laterality:  N/A;   CESAREAN SECTION N/A 05/09/2017   Procedure: REPEAT CESAREAN SECTION;  Surgeon: Teresa Fender, MD;  Location: [facility] OR[facility];  Service: Obstetrics;  Laterality: N/A;   HEMORROIDECTOMY     NASAL ENDOSCOPY Bilateral 01/05/2022   Procedure: VIVAER NASAL VALVE REPAIR;  Surgeon: Mellody Sprout, MD;  Location: [facility];  Service: ENT;  Laterality: Bilateral;   NASAL TURBINATE REDUCTION Bilateral 01/05/2022   Procedure: INFERIOR TURBINATE REDUCTION;  Surgeon: Mellody Sprout, MD;  Location: [facility];  Service: ENT;  Laterality: Bilateral;   SEPTOPLASTY N/A 01/05/2022   Procedure: SEPTOPLASTY;  Surgeon: Mellody Sprout, MD;  Location: [facility];  Service: ENT;  Laterality: N/A;   THERAPEUTIC ABORTION  08/2018   fetal CNS abnormalities   TONSILLECTOMY     WISDOM TOOTH EXTRACTION      Social History   Tobacco Use   Smoking status: Former    Current packs/day: 0.00    Types: Cigarettes    Quit date: 08/27/2009    Years since quitting: 14.2   Smokeless tobacco: Never  Vaping Use   Vaping status: Never Used  Substance Use Topics   Alcohol use: Not Currently    Alcohol/week: 0.0 standard drinks of alcohol   Drug use: No     Medication list has been reviewed and updated.  Current Meds  Medication Sig   amoxicillin -clavulanate (AUGMENTIN ) 875-125 MG tablet Take 1 tablet by mouth 2 (two) times daily for 7 days.   fluconazole  (DIFLUCAN ) 100 MG tablet Take 1 tablet (100 mg total) by mouth daily for 3 days.   hydrochlorothiazide  (HYDRODIURIL ) 25 MG tablet Take 1 tablet (25 mg total) by mouth daily.   Lifitegrast (XIIDRA) 5 % SOLN Apply 1 drop to eye 2 (two) times daily.   minoxidil (LONITEN) 2.5 MG tablet Take 2.5 mg by mouth daily.   norgestimate -ethinyl estradiol  (ORTHO-CYCLEN) 0.25-35 MG-MCG tablet Take 1 tablet by mouth daily.   nystatin  ointment (MYCOSTATIN ) APPLY TO AFFECTED AREA TWICE A DAY   spironolactone (ALDACTONE) 100 MG tablet Take 200 mg by mouth daily.    traZODone  (DESYREL ) 100 MG tablet TAKE 1 TABLET BY MOUTH EVERYDAY AT BEDTIME       11/08/2023    8:59 AM 07/13/2023    8:24 AM 03/06/2023    4:26 PM 09/22/2022    4:24 PM  GAD 7 : Generalized Anxiety Score  Nervous, Anxious, on Edge 0 3 2 2   Control/stop worrying 0 2 2 2   Worry too much - different things 0 2 3 0  Trouble relaxing 0 2 0 0  Restless 0 2 1 0  Easily annoyed or irritable 0 2 2 0  Afraid - awful might happen 0 1 1 0  Total GAD 7 Score 0 14 11 4   Anxiety Difficulty Not difficult at all Somewhat difficult Somewhat difficult Somewhat difficult       11/08/2023    8:59 AM 07/13/2023    8:24 AM 03/06/2023  4:25 PM  Depression screen PHQ 2/9  Decreased Interest 0 2 3  Down, Depressed, Hopeless 0 1 2  PHQ - 2 Score 0 3 5  Altered sleeping 0 2 2  Tired, decreased energy 0 2 3  Change in appetite 0 2 3  Feeling bad or failure about yourself  0 1 3  Trouble concentrating 0 0 0  Moving slowly or fidgety/restless 0 0 0  Suicidal thoughts 0 0 0  PHQ-9 Score 0 10 16  Difficult doing work/chores Not difficult at all Somewhat difficult Somewhat difficult    BP Readings from Last 3 Encounters:  11/08/23 122/76  07/13/23 102/72  03/06/23 122/78    Physical Exam Vitals and nursing note reviewed.  Constitutional:      General: She is not in acute distress.    Appearance: Normal appearance. She is well-developed.  HENT:     Head: Normocephalic and atraumatic.   Eyes:     General: Lids are normal.     Extraocular Movements: Extraocular movements intact.     Conjunctiva/sclera: Conjunctivae normal.    Cardiovascular:     Rate and Rhythm: Normal rate and regular rhythm.  Pulmonary:     Effort: Pulmonary effort is normal. No respiratory distress.     Breath sounds: No wheezing or rhonchi.  Abdominal:     General: Abdomen is flat.     Palpations: Abdomen is soft.     Tenderness: There is no right CVA tenderness or left CVA tenderness.   Skin:    General: Skin  is warm and dry.     Findings: No rash.   Neurological:     General: No focal deficit present.     Mental Status: She is alert and oriented to person, place, and time.   Psychiatric:        Mood and Affect: Mood normal.        Behavior: Behavior normal.    Lab Results  Component Value Date   COLORU yellow 11/08/2023   CLARITYU clear 11/08/2023   GLUCOSEUR Negative 11/08/2023   BILIRUBINUR negative 11/08/2023   KETONESU negative 11/08/2023   SPECGRAV 1.010 11/08/2023   RBCUR negative 11/08/2023   PHUR 6.5 11/08/2023   PROTEINUR Negative 11/08/2023   UROBILINOGEN 0.2 11/08/2023   LEUKOCYTESUR Moderate (2+) (A) 11/08/2023     Wt Readings from Last 3 Encounters:  11/08/23 197 lb 8 oz (89.6 kg)  07/13/23 227 lb (103 kg)  03/06/23 219 lb (99.3 kg)    BP 122/76   Pulse 94   Ht 5' 2 (1.575 m)   Wt 197 lb 8 oz (89.6 kg)   SpO2 99%   BMI 36.12 kg/m   Assessment and Plan:  Problem List Items Addressed This Visit       Unprioritized   Chronic dryness of both eyes   Relevant Medications   Lifitegrast (XIIDRA) 5 % SOLN   Other Visit Diagnoses       Vaginal discharge    -  Primary   suspect yeast due to antibiotic therapy so will treat presumptively wait for Aptima result for final dispositioin   Relevant Medications   fluconazole  (DIFLUCAN ) 100 MG tablet   Other Relevant Orders   POCT urinalysis dipstick (Completed)   Cervicovaginal ancillary only       No follow-ups on file.    Sheron Dixons, MD [facility] [place]

## 2023-11-09 ENCOUNTER — Ambulatory Visit: Payer: Self-pay | Admitting: Internal Medicine

## 2023-11-09 ENCOUNTER — Ambulatory Visit: Admitting: Internal Medicine

## 2023-11-09 DIAGNOSIS — B9689 Other specified bacterial agents as the cause of diseases classified elsewhere: Secondary | ICD-10-CM

## 2023-11-09 LAB — CERVICOVAGINAL ANCILLARY ONLY
Bacterial Vaginitis (gardnerella): POSITIVE — AB
Candida Glabrata: NEGATIVE
Candida Vaginitis: POSITIVE — AB
Comment: NEGATIVE
Comment: NEGATIVE
Comment: NEGATIVE
Comment: NEGATIVE
Trichomonas: NEGATIVE

## 2023-11-09 MED ORDER — METRONIDAZOLE 500 MG PO TABS: 500.0000 mg | ORAL_TABLET | Freq: Two times a day (BID) | ORAL | 0 refills | Status: AC

## 2023-11-09 NOTE — Addendum Note (Signed)
 Addended by: Sheron Dixons on: 11/09/2023 09:54 AM   Modules accepted: Level of Service

## 2023-11-29 ENCOUNTER — Other Ambulatory Visit: Payer: Self-pay | Admitting: Internal Medicine

## 2023-11-29 DIAGNOSIS — B354 Tinea corporis: Secondary | ICD-10-CM

## 2023-12-03 NOTE — Telephone Encounter (Signed)
 Requested medications are due for refill today.  yes  Requested medications are on the active medications list.  yes  Last refill. 08/01/2023 30 g 0 rf  Future visit scheduled.   yes  Notes to clinic.  Medication not assigned to a protocol. Please review for refill.    Requested Prescriptions  Pending Prescriptions Disp Refills   nystatin  ointment (MYCOSTATIN ) [Pharmacy Med Name: NYSTATIN  100,000 UNIT/GM OINT] 30 g 0    Sig: APPLY TO AFFECTED AREA TWICE A DAY     Off-Protocol Failed - 12/03/2023  3:56 PM      Failed - Medication not assigned to a protocol, review manually.      Passed - Valid encounter within last 12 months    Recent Outpatient Visits           3 weeks ago Vaginal discharge   [facility] at [facility], Leita DEL, MD   4 months ago Annual physical exam   [facility] & [facility] Medicine at [facility], Leita DEL, MD       Future Appointments             In 7 months Lemon Raisin, MD [facility] Primary Care & Sports Medicine at [facility], [facility]

## 2023-12-05 ENCOUNTER — Other Ambulatory Visit: Payer: Self-pay | Admitting: Internal Medicine

## 2023-12-05 DIAGNOSIS — M797 Fibromyalgia: Secondary | ICD-10-CM | POA: Diagnosis not present

## 2023-12-05 DIAGNOSIS — G894 Chronic pain syndrome: Secondary | ICD-10-CM | POA: Diagnosis not present

## 2023-12-05 DIAGNOSIS — H04123 Dry eye syndrome of bilateral lacrimal glands: Secondary | ICD-10-CM

## 2023-12-05 DIAGNOSIS — R519 Headache, unspecified: Secondary | ICD-10-CM | POA: Diagnosis not present

## 2023-12-05 DIAGNOSIS — G8929 Other chronic pain: Secondary | ICD-10-CM | POA: Diagnosis not present

## 2023-12-06 NOTE — Telephone Encounter (Signed)
 Requested medications are due for refill today.  yes  Requested medications are on the active medications list.  yes  Last refill. 11/08/2023   Future visit scheduled.   yes  Notes to clinic.  New medication to this pt. Please review for refill.    Requested Prescriptions  Pending Prescriptions Disp Refills   XIIDRA  5 % SOLN [Pharmacy Med Name: XIIDRA  5% EYE DROPS]      Sig: APPLY 1 DROP TO AFFECTED EYE(S) 2 TIMES DAILY.     Ophthalmology:  Glaucoma Passed - 12/06/2023  4:51 PM      Passed - Valid encounter within last 12 months    Recent Outpatient Visits           4 weeks ago Vaginal discharge   [facility] Primary Care & Sports Medicine at [facility], Leita DEL, MD   4 months ago Annual physical exam   [facility] Primary Care & Sports Medicine at [facility], Leita DEL, MD       Future Appointments             In 7 months Lemon Raisin, MD [facility] Primary Care & Sports Medicine at [facility], [facility]

## 2023-12-20 DIAGNOSIS — F4323 Adjustment disorder with mixed anxiety and depressed mood: Secondary | ICD-10-CM | POA: Diagnosis not present

## 2024-01-10 DIAGNOSIS — H5213 Myopia, bilateral: Secondary | ICD-10-CM | POA: Diagnosis not present

## 2024-01-18 DIAGNOSIS — F4323 Adjustment disorder with mixed anxiety and depressed mood: Secondary | ICD-10-CM | POA: Diagnosis not present

## 2024-01-21 DIAGNOSIS — G894 Chronic pain syndrome: Secondary | ICD-10-CM | POA: Diagnosis not present

## 2024-01-21 DIAGNOSIS — M797 Fibromyalgia: Secondary | ICD-10-CM | POA: Diagnosis not present

## 2024-01-21 DIAGNOSIS — Z1331 Encounter for screening for depression: Secondary | ICD-10-CM | POA: Diagnosis not present

## 2024-01-26 ENCOUNTER — Other Ambulatory Visit: Payer: Self-pay | Admitting: Internal Medicine

## 2024-01-26 DIAGNOSIS — R0981 Nasal congestion: Secondary | ICD-10-CM

## 2024-01-29 NOTE — Telephone Encounter (Signed)
 Unable to refill per protocol, Rx expired. Discontinued 07/13/23.  Requested Prescriptions  Pending Prescriptions Disp Refills   fluticasone  (FLONASE ) 50 MCG/ACT nasal spray [Pharmacy Med Name: FLUTICASONE  PROP 50 MCG SPRAY] 48 mL 2    Sig: SPRAY 2 SPRAYS INTO EACH NOSTRIL EVERY DAY     Ear, Nose, and Throat: Nasal Preparations - Corticosteroids Passed - 01/29/2024  9:03 AM      Passed - Valid encounter within last 12 months    Recent Outpatient Visits           2 months ago Vaginal discharge   [facility] Primary Care & Sports Medicine at [facility], Leita DEL, MD   6 months ago Annual physical exam   [facility] Primary Care & Sports Medicine at [facility], Leita DEL, MD       Future Appointments             In 5 months Lemon Raisin, MD [facility] Primary Care & Sports Medicine at [facility], [phone] [place]

## 2024-02-11 ENCOUNTER — Encounter: Payer: Self-pay | Admitting: Internal Medicine

## 2024-02-11 ENCOUNTER — Ambulatory Visit: Admitting: Internal Medicine

## 2024-02-11 VITALS — BP 120/80 | HR 97 | Ht 62.0 in | Wt 191.0 lb

## 2024-02-11 DIAGNOSIS — R0981 Nasal congestion: Secondary | ICD-10-CM

## 2024-02-11 DIAGNOSIS — F411 Generalized anxiety disorder: Secondary | ICD-10-CM | POA: Diagnosis not present

## 2024-02-11 DIAGNOSIS — M797 Fibromyalgia: Secondary | ICD-10-CM | POA: Diagnosis not present

## 2024-02-11 DIAGNOSIS — F331 Major depressive disorder, recurrent, moderate: Secondary | ICD-10-CM

## 2024-02-11 MED ORDER — FLUTICASONE PROPIONATE 50 MCG/ACT NA SUSP: 2.0000 | Freq: Every day | NASAL | 5 refills | Status: DC

## 2024-02-11 MED ORDER — HYDROXYZINE PAMOATE 25 MG PO CAPS: 25.0000 mg | ORAL_CAPSULE | Freq: Three times a day (TID) | ORAL | 0 refills | Status: AC | PRN

## 2024-02-11 NOTE — Assessment & Plan Note (Signed)
 Worsening since dealing with a custody battle in court Continue Vistaril  prn Refer to psych

## 2024-02-11 NOTE — Assessment & Plan Note (Signed)
 Depression and anxiety have increased again. On Trazodone  and takes vistaril  prn panic attacks. Custody battle continues which is contributing greatly. She feels she need Psych care again but can not see her prior provider Will refill Vistaril  and refer

## 2024-02-11 NOTE — Progress Notes (Signed)
 Date:  02/11/2024   Name:  Christine Arellano   DOB:  June 30, 1990   MRN:  [id number]   Chief Complaint: Fibromyalgia (FMLA) and Panic Attack (Having panic attacks, heart races, has chest tightness )  Depression        This is a chronic problem.  The problem occurs daily.The problem is unchanged.  Associated symptoms include fatigue, insomnia, decreased interest, myalgias and headaches.  Associated symptoms include no suicidal ideas.     The symptoms are aggravated by family issues and social issues.  Past treatments include SSRIs - Selective serotonin reuptake inhibitors (Vistaril ). Fibromyalgia - she has struggled with this for some time.  Savella was very effective but it is not covered on Medicaid.  She is now seeing pain specialist who prescribed Naltrexone 1.5 mg daily.  She is working full time at [employer].  However, struggles with the long overtime hours that occur 5-10 hours per week with some Saturday hours.  Her pain MD declined to provide FMLA so she came here.  Review of Systems  Constitutional:  Positive for fatigue. Negative for chills and fever.  Respiratory:  Negative for chest tightness and shortness of breath.   Cardiovascular:  Positive for palpitations and leg swelling. Negative for chest pain.  Musculoskeletal:  Positive for myalgias.  Neurological:  Positive for headaches. Negative for dizziness, tremors, seizures and numbness.  Psychiatric/Behavioral:  Positive for depression, dysphoric mood and sleep disturbance. Negative for suicidal ideas. The patient is nervous/anxious and has insomnia.      Lab Results  Component Value Date   NA 137 07/13/2023   K 4.3 07/13/2023   CO2 22 07/13/2023   GLUCOSE 78 07/13/2023   BUN 6 07/13/2023   CREATININE 0.76 07/13/2023   CALCIUM 9.3 07/13/2023   EGFR 105 07/13/2023   GFRNONAA >60 03/27/2022   Lab Results  Component Value Date   CHOL 207 (H) 07/13/2023   HDL 52 07/13/2023   LDLCALC 126 (H) 07/13/2023   TRIG 164 (H)  07/13/2023   CHOLHDL 4.0 07/13/2023   Lab Results  Component Value Date   TSH 1.750 07/13/2023   Lab Results  Component Value Date   HGBA1C 5.1 07/13/2023   Lab Results  Component Value Date   WBC 4.2 07/13/2023   HGB 13.6 07/13/2023   HCT 41.9 07/13/2023   MCV 86 07/13/2023   PLT 265 07/13/2023   Lab Results  Component Value Date   ALT 13 07/13/2023   AST 17 07/13/2023   ALKPHOS 67 07/13/2023   BILITOT <0.2 07/13/2023   Lab Results  Component Value Date   VD25OH 61.38 03/27/2022     Patient Active Problem List   Diagnosis Date Noted   Swelling of limb 10/13/2022   Lymphedema 09/01/2022   Impingement of right shoulder 08/28/2022   Mixed hyperlipidemia 06/30/2022   Raynaud's disease without gangrene 05/12/2022   Lumbar facet syndrome (Bilateral) (R>L) 04/26/2022    Class: Chronic   Chronic knee pain (3ry area of Pain) (Bilateral) (R>L) 03/27/2022   Chronic upper back pain (4th area of Pain) (Midline) (Bilateral) 03/27/2022   Chronic shoulder pain (5th area of Pain) (Bilateral) (R>L) 03/27/2022   Chronic hip pain (6th area of Pain) (Bilateral) (R>L) 03/27/2022   Stiffness of right knee 11/16/2021   Fibromyalgia syndrome (7th area of Pain) 09/22/2021   Polyarthralgia 09/22/2021   Chronic sacroiliac joint pain (Bilateral) (R>L) 09/14/2021    Class: Chronic   B12 nutritional deficiency 08/30/2021   Lumbosacral spondylosis  with radiculopathy 08/03/2021   Chronic low back pain (1ry area of Pain) (Bilateral) (R>L) w/o sciatica 03/30/2021   BMI 40.0-44.9, adult (HCC) 03/30/2021   Migraine with aura (8th area of Pain) 01/26/2021   Inverted nipple 05/05/2019   Moderate episode of recurrent major depressive disorder (HCC) 05/05/2019   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   Generalized anxiety disorder 03/04/2019   Alopecia of scalp 03/04/2019   Chronic dryness of both eyes 10/18/2017   Chronic lower extremity pain (2ry area of Pain) (Bilateral) (R>L) 07/06/2016   Adult acne  05/03/2015    No Known Allergies  Past Surgical History:  Procedure Laterality Date   arthroscopic knee Right 2008   ARTHROSCOPIC REPAIR PCL Right 10/14/2021   CESAREAN SECTION  2014   CESAREAN SECTION N/A 02/24/2015   Procedure: CESAREAN SECTION;  Surgeon: Archie Savers, MD;  Location: [facility] OR[facility];  Service: Obstetrics;  Laterality: N/A;   CESAREAN SECTION N/A 05/09/2017   Procedure: REPEAT CESAREAN SECTION;  Surgeon: Savers Archie, MD;  Location: [facility];  Service: Obstetrics;  Laterality: N/A;   HEMORROIDECTOMY     NASAL ENDOSCOPY Bilateral 01/05/2022   Procedure: VIVAER NASAL VALVE REPAIR;  Surgeon: Edda Mt, MD;  Location: [facility];  Service: ENT;  Laterality: Bilateral;   NASAL TURBINATE REDUCTION Bilateral 01/05/2022   Procedure: INFERIOR TURBINATE REDUCTION;  Surgeon: Edda Mt, MD;  Location: [facility];  Service: ENT;  Laterality: Bilateral;   SEPTOPLASTY N/A 01/05/2022   Procedure: SEPTOPLASTY;  Surgeon: Edda Mt, MD;  Location: [facility];  Service: ENT;  Laterality: N/A;   THERAPEUTIC ABORTION  08/2018   fetal CNS abnormalities   TONSILLECTOMY     WISDOM TOOTH EXTRACTION      Social History   Tobacco Use   Smoking status: Former    Current packs/day: 0.00    Types: Cigarettes    Quit date: 08/27/2009    Years since quitting: 14.4   Smokeless tobacco: Never  Vaping Use   Vaping status: Never Used  Substance Use Topics   Alcohol use: Not Currently    Alcohol/week: 0.0 standard drinks of alcohol   Drug use: No     Medication list has been reviewed and updated.  Current Meds  Medication Sig   celecoxib  (CELEBREX ) 200 MG capsule Take 200-400 mg by mouth daily as needed.   hydrochlorothiazide  (HYDRODIURIL ) 25 MG tablet Take 1 tablet (25 mg total) by mouth daily.   Lifitegrast  (XIIDRA ) 5 % SOLN APPLY 1 DROP TO AFFECTED EYE(S) 2 TIMES DAILY.   minoxidil (LONITEN) 2.5 MG tablet Take 2.5 mg by mouth daily. (Patient taking  differently: Take 5 mg by mouth daily.)   norgestimate -ethinyl estradiol  (ORTHO-CYCLEN) 0.25-35 MG-MCG tablet Take 1 tablet by mouth daily.   nystatin  ointment (MYCOSTATIN ) APPLY TO AFFECTED AREA TWICE A DAY   spironolactone (ALDACTONE) 100 MG tablet Take 200 mg by mouth daily.   topiramate  (TOPAMAX ) 50 MG tablet Take 100 mg by mouth.   traZODone  (DESYREL ) 100 MG tablet TAKE 1 TABLET BY MOUTH EVERYDAY AT BEDTIME   UNABLE TO FIND Take 1.5 mg by mouth. (Patient taking differently: Take 1.5 mg by mouth. Naltrexone)   Vitamin D , Ergocalciferol , (DRISDOL) 1.25 MG (50000 UNIT) CAPS capsule Take 50,000 Units by mouth once a week.   [DISCONTINUED] fluticasone  (FLONASE ) 50 MCG/ACT nasal spray Place 2 sprays into the nose.   [DISCONTINUED] hydrOXYzine  (VISTARIL ) 25 MG capsule        11/08/2023    8:59 AM 07/13/2023  8:24 AM 03/06/2023    4:26 PM 09/22/2022    4:24 PM  GAD 7 : Generalized Anxiety Score  Nervous, Anxious, on Edge 0 3 2 2   Control/stop worrying 0 2 2 2   Worry too much - different things 0 2 3 0  Trouble relaxing 0 2 0 0  Restless 0 2 1 0  Easily annoyed or irritable 0 2 2 0  Afraid - awful might happen 0 1 1 0  Total GAD 7 Score 0 14 11 4   Anxiety Difficulty Not difficult at all Somewhat difficult Somewhat difficult Somewhat difficult       11/08/2023    8:59 AM 07/13/2023    8:24 AM 03/06/2023    4:25 PM  Depression screen PHQ 2/9  Decreased Interest 0 2 3  Down, Depressed, Hopeless 0 1 2  PHQ - 2 Score 0 3 5  Altered sleeping 0 2 2  Tired, decreased energy 0 2 3  Change in appetite 0 2 3  Feeling bad or failure about yourself  0 1 3  Trouble concentrating 0 0 0  Moving slowly or fidgety/restless 0 0 0  Suicidal thoughts 0 0 0  PHQ-9 Score 0 10 16  Difficult doing work/chores Not difficult at all Somewhat difficult Somewhat difficult    BP Readings from Last 3 Encounters:  02/11/24 120/80  11/08/23 122/76  07/13/23 102/72    Physical Exam Vitals and nursing  note reviewed.  Constitutional:      General: She is not in acute distress.    Appearance: Normal appearance. She is well-developed.  HENT:     Head: Normocephalic and atraumatic.  Cardiovascular:     Rate and Rhythm: Normal rate and regular rhythm.  Pulmonary:     Effort: Pulmonary effort is normal. No respiratory distress.     Breath sounds: No wheezing or rhonchi.  Musculoskeletal:        General: Normal range of motion.     Cervical back: Normal range of motion.     Right lower leg: No edema.     Left lower leg: No edema.  Lymphadenopathy:     Cervical: No cervical adenopathy.  Skin:    General: Skin is warm and dry.     Findings: No rash.  Neurological:     Mental Status: She is alert and oriented to person, place, and time.  Psychiatric:        Attention and Perception: Attention normal.        Mood and Affect: Mood normal. Affect is tearful.        Speech: Speech normal.        Behavior: Behavior normal.        Thought Content: Thought content normal. Thought content does not include suicidal ideation. Thought content does not include suicidal plan.     Wt Readings from Last 3 Encounters:  02/11/24 191 lb (86.6 kg)  11/08/23 197 lb 8 oz (89.6 kg)  07/13/23 227 lb (103 kg)    BP 120/80   Pulse 97   Ht 5' 2 (1.575 m)   Wt 191 lb (86.6 kg)   SpO2 99%   BMI 34.93 kg/m   Assessment and Plan:  Problem List Items Addressed This Visit       Unprioritized   Moderate episode of recurrent major depressive disorder (HCC) - Primary (Chronic)   Depression and anxiety have increased again. On Trazodone  and takes vistaril  prn panic attacks. Custody battle continues which is contributing  greatly. She feels she need Psych care again but can not see her prior provider Will refill Vistaril  and refer      Relevant Medications   hydrOXYzine  (VISTARIL ) 25 MG capsule   Other Relevant Orders   Ambulatory referral to Psychiatry   Fibromyalgia syndrome (7th area of Pain)  (Chronic)   Currently on low dose Naltrexone with some benefit. Still has bad days, esp if she works > 5 d/week or overtime Seeking FMLA to give some relief if needed. We agreed to 4 days per 30 - up to 10 hours per episode.      Relevant Medications   celecoxib  (CELEBREX ) 200 MG capsule   topiramate  (TOPAMAX ) 50 MG tablet   Generalized anxiety disorder   Worsening since dealing with a custody battle in court Continue Vistaril  prn Refer to psych      Relevant Medications   hydrOXYzine  (VISTARIL ) 25 MG capsule   Other Relevant Orders   Ambulatory referral to Psychiatry   Other Visit Diagnoses       Chronic nasal congestion       Relevant Medications   fluticasone  (FLONASE ) 50 MCG/ACT nasal spray       No follow-ups on file.    Leita HILARIO Adie, MD [facility] Primary Care and Sports Medicine [place]

## 2024-02-11 NOTE — Assessment & Plan Note (Signed)
 Currently on low dose Naltrexone with some benefit. Still has bad days, esp if she works > 5 d/week or overtime Seeking FMLA to give some relief if needed. We agreed to 4 days per 30 - up to 10 hours per episode.

## 2024-02-21 ENCOUNTER — Ambulatory Visit: Payer: Self-pay

## 2024-02-21 NOTE — Telephone Encounter (Signed)
 Please review.  KP

## 2024-02-21 NOTE — Telephone Encounter (Signed)
 FYI Only or Action Required?: Action required by provider: update on patient condition and medication adjustment request.  Patient was last seen in primary care on 02/11/2024 by Christine Leita DEL, MD.  Called Nurse Triage reporting Leg Swelling.  Symptoms began several months ago.  Interventions attempted: Prescription medications: hctz.  Symptoms are: gradually worsening.  Triage Disposition: See Physician Within 24 Hours  Patient/caregiver understands and will follow disposition?: No, wishes to speak with PCP   Copied from CRM #[id number]. Topic: Clinical - Red Word Triage >> Feb 21, 2024  2:21 PM Christine Arellano wrote: Red Word that prompted transfer to Nurse Triage: Still having swelling in both legs that's getting worse. When pushing into legs it leaves a big indent (interested in an increase dosage for water pills) Reason for Disposition  [1] MODERATE leg swelling (e.g., swelling extends up to knees) AND [2] new-onset or getting worse  Answer Assessment - Initial Assessment Questions Additional info: Patient lymphedema is managed on hydrochlorothiazide , she has not missed any doses, she is requesting an increase of dose due to increased edema bilaterally. Leaving indent in shins. Offered next available acute visit but she states she was in office recently and would prefer a dose increase. Please advise.    1. ONSET: When did the swelling start? (e.g., minutes, hours, days)     Chronic-on medication 2. LOCATION: What part of the leg is swollen?  Are both legs swollen or just one leg?     bilateral 3. SEVERITY: How bad is the swelling? (e.g., localized; mild, moderate, severe)     Leaves indent 4. REDNESS: Is there redness or signs of infection?     Denies  5. PAIN: Is the swelling painful to touch? If Yes, ask: How painful is it?   (Scale 1-10; mild, moderate or severe)     aching 6. FEVER: Do you have a fever? If Yes, ask: What is it, how was it measured, and when  did it start?      denies 7. CAUSE: What do you think is causing the leg swelling?     chronic 8. MEDICAL HISTORY: Do you have a history of blood clots (e.g., DVT), cancer, heart failure, kidney disease, or liver failure?     lymphedema 9. RECURRENT SYMPTOM: Have you had leg swelling before? If Yes, ask: When was the last time? What happened that time?     yes 10. OTHER SYMPTOMS: Do you have any other symptoms? (e.g., chest pain, difficulty breathing)       Denies weeping fluid, redness, pain, warmth, chest pain, breathing difficulty. Reports 'no' to other symptoms at this time.  11. PREGNANCY: Is there any chance you are pregnant? When was your last menstrual period?  Protocols used: Leg Swelling and Edema-A-AH

## 2024-02-22 NOTE — Telephone Encounter (Signed)
Sent pt a message on Mychart.  KP

## 2024-02-29 ENCOUNTER — Other Ambulatory Visit: Payer: Self-pay | Admitting: Internal Medicine

## 2024-02-29 DIAGNOSIS — H04123 Dry eye syndrome of bilateral lacrimal glands: Secondary | ICD-10-CM

## 2024-03-03 NOTE — Telephone Encounter (Signed)
 Requested Prescriptions  Pending Prescriptions Disp Refills   Lifitegrast  (XIIDRA ) 5 % SOLN [Pharmacy Med Name: XIIDRA  5% EYE DROPS] 5 each 0    Sig: APPLY 1 DROP TO AFFECTED EYE(S) 2 TIMES DAILY.     Ophthalmology:  Glaucoma Passed - 03/03/2024 12:24 PM      Passed - Valid encounter within last 12 months    Recent Outpatient Visits           3 weeks ago Moderate episode of recurrent major depressive disorder [facility])   [place] [facility] at [facility], Leita DEL, MD   3 months ago Vaginal discharge   [facility] at [facility], Leita DEL, MD   7 months ago Annual physical exam   [facility] Care & Sports Medicine at [facility], Leita DEL, MD       Future Appointments             In 4 months Lemon Raisin, MD [facility] at [facility], [phone] [place]

## 2024-03-23 ENCOUNTER — Other Ambulatory Visit: Payer: Self-pay | Admitting: Internal Medicine

## 2024-03-23 DIAGNOSIS — M4727 Other spondylosis with radiculopathy, lumbosacral region: Secondary | ICD-10-CM

## 2024-03-23 DIAGNOSIS — M533 Sacrococcygeal disorders, not elsewhere classified: Secondary | ICD-10-CM

## 2024-03-25 ENCOUNTER — Telehealth: Payer: Self-pay

## 2024-03-25 ENCOUNTER — Other Ambulatory Visit: Payer: Self-pay

## 2024-03-25 DIAGNOSIS — L700 Acne vulgaris: Secondary | ICD-10-CM

## 2024-03-25 DIAGNOSIS — L659 Nonscarring hair loss, unspecified: Secondary | ICD-10-CM

## 2024-03-25 NOTE — Telephone Encounter (Signed)
 Requested medication (s) are due for refill today: Yes  Requested medication (s) are on the active medication list: Yes  Last refill:  02/11/24  Future visit scheduled: Yes  Notes to clinic:  Unable to refill per protocol, last refill by another provider.      Requested Prescriptions  Pending Prescriptions Disp Refills   celecoxib  (CELEBREX ) 200 MG capsule [Pharmacy Med Name: CELECOXIB  200 MG CAPSULE] 60 capsule 2    Sig: TAKE 1 TO 2 CAPSULES BY MOUTH EVERY DAY AS NEEDED FOR PAIN     Analgesics:  COX2 Inhibitors Failed - 03/25/2024 11:00 AM      Failed - Manual Review: Labs are only required if the patient has taken medication for more than 8 weeks.      Passed - HGB in normal range and within 360 days    Hemoglobin  Date Value Ref Range Status  07/13/2023 13.6 11.1 - 15.9 g/dL Final  97/76/7983 86.1 g/dL Final         Passed - Cr in normal range and within 360 days    Creatinine, Ser  Date Value Ref Range Status  07/13/2023 0.76 0.57 - 1.00 mg/dL Final         Passed - HCT in normal range and within 360 days    HCT  Date Value Ref Range Status  07/21/2014 41 % Final   Hematocrit  Date Value Ref Range Status  07/13/2023 41.9 34.0 - 46.6 % Final         Passed - AST in normal range and within 360 days    AST  Date Value Ref Range Status  07/13/2023 17 0 - 40 IU/L Final         Passed - ALT in normal range and within 360 days    ALT  Date Value Ref Range Status  07/13/2023 13 0 - 32 IU/L Final         Passed - eGFR is 30 or above and within 360 days    GFR calc Af Amer  Date Value Ref Range Status  05/31/2018 154 >59 mL/min/1.73 Final   GFR, Estimated  Date Value Ref Range Status  03/27/2022 >60 >60 mL/min Final    Comment:    (NOTE) Calculated using the CKD-EPI Creatinine Equation (2021)    eGFR  Date Value Ref Range Status  07/13/2023 105 >59 mL/min/1.73 Final         Passed - Patient is not pregnant      Passed - Valid encounter within last  12 months    Recent Outpatient Visits           1 month ago Moderate episode of recurrent major depressive disorder (HCC)    Primary Care & Sports Medicine at [facility], Leita DEL, MD   4 months ago Vaginal discharge   [facility] Primary Care & Sports Medicine at [facility], Leita DEL, MD   8 months ago Annual physical exam   [facility] at [facility], Leita DEL, MD       Future Appointments             In 3 months Lemon Raisin, MD [facility] Care [facility] Medicine at [facility], [phone] [place]

## 2024-03-25 NOTE — Telephone Encounter (Signed)
 Copied from CRM [id number]. Topic: Referral - Request for Referral >> Mar 25, 2024 11:41 AM Charlet HERO wrote: Did the patient discuss referral with their provider in the last year? Yes   Appointment offered? No  Type of order/referral and detailed reason for visit: Dermatology  Preference of office, provider, location: [facility] in [place]  If referral order, have you been seen by this specialty before? Yes Skin issue  Can we respond through MyChart? Yes

## 2024-03-26 NOTE — Telephone Encounter (Signed)
 Please review.  KP

## 2024-04-03 DIAGNOSIS — F340 Cyclothymic disorder: Secondary | ICD-10-CM | POA: Diagnosis not present

## 2024-04-09 ENCOUNTER — Other Ambulatory Visit: Payer: Self-pay | Admitting: Internal Medicine

## 2024-04-09 DIAGNOSIS — H04123 Dry eye syndrome of bilateral lacrimal glands: Secondary | ICD-10-CM

## 2024-04-11 NOTE — Telephone Encounter (Signed)
 Requested Prescriptions  Pending Prescriptions Disp Refills   Lifitegrast  (XIIDRA ) 5 % SOLN [Pharmacy Med Name: XIIDRA  5% EYE DROPS] 5 each 0    Sig: APPLY 1 DROP TO AFFECTED EYE(S) 2 TIMES DAILY.     Ophthalmology:  Glaucoma Passed - 04/11/2024 12:29 PM      Passed - Valid encounter within last 12 months    Recent Outpatient Visits           2 months ago Moderate episode of recurrent major depressive disorder [facility])   [place] [facility] at [facility], Leita DEL, MD   5 months ago Vaginal discharge   [facility] Primary Care & Sports Medicine at [facility], Leita DEL, MD   9 months ago Annual physical exam   [facility] Care & [facility] Medicine at [facility], Leita DEL, MD       Future Appointments             In 3 months Lemon Raisin, MD [facility] Primary Care & Sports Medicine at [facility], [phone] [place]

## 2024-04-17 DIAGNOSIS — L648 Other androgenic alopecia: Secondary | ICD-10-CM | POA: Diagnosis not present

## 2024-04-17 DIAGNOSIS — L650 Telogen effluvium: Secondary | ICD-10-CM | POA: Diagnosis not present

## 2024-05-06 DIAGNOSIS — R519 Headache, unspecified: Secondary | ICD-10-CM | POA: Diagnosis not present

## 2024-05-06 DIAGNOSIS — M797 Fibromyalgia: Secondary | ICD-10-CM | POA: Diagnosis not present

## 2024-05-06 DIAGNOSIS — G894 Chronic pain syndrome: Secondary | ICD-10-CM | POA: Diagnosis not present

## 2024-05-06 DIAGNOSIS — G43919 Migraine, unspecified, intractable, without status migrainosus: Secondary | ICD-10-CM | POA: Diagnosis not present

## 2024-05-06 DIAGNOSIS — G8929 Other chronic pain: Secondary | ICD-10-CM | POA: Diagnosis not present

## 2024-05-14 ENCOUNTER — Telehealth: Admitting: Physician Assistant

## 2024-05-14 DIAGNOSIS — J069 Acute upper respiratory infection, unspecified: Secondary | ICD-10-CM | POA: Diagnosis not present

## 2024-05-14 MED ORDER — FLUTICASONE PROPIONATE 50 MCG/ACT NA SUSP: 2.0000 | Freq: Every day | NASAL | 0 refills | Status: AC

## 2024-05-14 MED ORDER — PSEUDOEPH-BROMPHEN-DM 30-2-10 MG/5ML PO SYRP: 5.0000 mL | ORAL_SOLUTION | Freq: Four times a day (QID) | ORAL | 0 refills | Status: AC | PRN

## 2024-05-14 NOTE — Progress Notes (Signed)

## 2024-05-26 ENCOUNTER — Other Ambulatory Visit: Payer: Self-pay | Admitting: Internal Medicine

## 2024-05-26 NOTE — Progress Notes (Unsigned)
 "   Date:  05/26/2024   Name:  Christine Arellano   DOB:  1990/09/20   MRN:  [id number]   Chief Complaint: No chief complaint on file.  HPI  Review of Systems   Lab Results  Component Value Date   NA 137 07/13/2023   K 4.3 07/13/2023   CO2 22 07/13/2023   GLUCOSE 78 07/13/2023   BUN 6 07/13/2023   CREATININE 0.76 07/13/2023   CALCIUM 9.3 07/13/2023   EGFR 105 07/13/2023   GFRNONAA >60 03/27/2022   Lab Results  Component Value Date   CHOL 207 (H) 07/13/2023   HDL 52 07/13/2023   LDLCALC 126 (H) 07/13/2023   TRIG 164 (H) 07/13/2023   CHOLHDL 4.0 07/13/2023   Lab Results  Component Value Date   TSH 1.750 07/13/2023   Lab Results  Component Value Date   HGBA1C 5.1 07/13/2023   Lab Results  Component Value Date   WBC 4.2 07/13/2023   HGB 13.6 07/13/2023   HCT 41.9 07/13/2023   MCV 86 07/13/2023   PLT 265 07/13/2023   Lab Results  Component Value Date   ALT 13 07/13/2023   AST 17 07/13/2023   ALKPHOS 67 07/13/2023   BILITOT <0.2 07/13/2023   Lab Results  Component Value Date   VD25OH 61.38 03/27/2022     Patient Active Problem List   Diagnosis Date Noted   Swelling of limb 10/13/2022   Lymphedema 09/01/2022   Impingement of right shoulder 08/28/2022   Mixed hyperlipidemia 06/30/2022   Raynaud's disease without gangrene 05/12/2022   Lumbar facet syndrome (Bilateral) (R>L) 04/26/2022    Class: Chronic   Chronic knee pain (3ry area of Pain) (Bilateral) (R>L) 03/27/2022   Chronic upper back pain (4th area of Pain) (Midline) (Bilateral) 03/27/2022   Chronic shoulder pain (5th area of Pain) (Bilateral) (R>L) 03/27/2022   Chronic hip pain (6th area of Pain) (Bilateral) (R>L) 03/27/2022   Stiffness of right knee 11/16/2021   Fibromyalgia syndrome (7th area of Pain) 09/22/2021   Polyarthralgia 09/22/2021   Chronic sacroiliac joint pain (Bilateral) (R>L) 09/14/2021    Class: Chronic   B12 nutritional deficiency 08/30/2021   Lumbosacral spondylosis with  radiculopathy 08/03/2021   Chronic low back pain (1ry area of Pain) (Bilateral) (R>L) w/o sciatica 03/30/2021   BMI 40.0-44.9, adult (HCC) 03/30/2021   Migraine with aura (8th area of Pain) 01/26/2021   Inverted nipple 05/05/2019   Moderate episode of recurrent major depressive disorder (HCC) 05/05/2019   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   Generalized anxiety disorder 03/04/2019   Alopecia of scalp 03/04/2019   Chronic dryness of both eyes 10/18/2017   Chronic lower extremity pain (2ry area of Pain) (Bilateral) (R>L) 07/06/2016   Adult acne 05/03/2015    Allergies[1]  Past Surgical History:  Procedure Laterality Date   arthroscopic knee Right 2008   ARTHROSCOPIC REPAIR PCL Right 10/14/2021   CESAREAN SECTION  2014   CESAREAN SECTION N/A 02/24/2015   Procedure: CESAREAN SECTION;  Surgeon: Archie Savers, MD;  Location: [facility] ORS;  Service: Obstetrics;  Laterality: N/A;   CESAREAN SECTION N/A 05/09/2017   Procedure: REPEAT CESAREAN SECTION;  Surgeon: Savers Archie, MD;  Location: [facility] OR[facility];  Service: Obstetrics;  Laterality: N/A;   HEMORROIDECTOMY     NASAL ENDOSCOPY Bilateral 01/05/2022   Procedure: VIVAER NASAL VALVE REPAIR;  Surgeon: Juengel, Paul, MD;  Location: [facility];  Service: ENT;  Laterality: Bilateral;   NASAL TURBINATE REDUCTION Bilateral 01/05/2022   Procedure: INFERIOR TURBINATE  REDUCTION;  Surgeon: Edda Mt, MD;  Location: [facility];  Service: ENT;  Laterality: Bilateral;   SEPTOPLASTY N/A 01/05/2022   Procedure: SEPTOPLASTY;  Surgeon: Edda Mt, MD;  Location: [facility];  Service: ENT;  Laterality: N/A;   THERAPEUTIC ABORTION  08/2018   fetal CNS abnormalities   TONSILLECTOMY     WISDOM TOOTH EXTRACTION      Social History[2]   Medication list has been reviewed and updated.  Active Medications[3]     11/08/2023    8:59 AM 07/13/2023    8:24 AM 03/06/2023    4:26 PM 09/22/2022    4:24 PM  GAD 7 : Generalized Anxiety Score   Nervous, Anxious, on Edge 0 3 2 2   Control/stop worrying 0 2 2 2   Worry too much - different things 0 2 3 0  Trouble relaxing 0 2 0 0  Restless 0 2 1 0  Easily annoyed or irritable 0 2 2 0  Afraid - awful might happen 0 1 1 0  Total GAD 7 Score 0 14 11 4   Anxiety Difficulty Not difficult at all Somewhat difficult Somewhat difficult Somewhat difficult       11/08/2023    8:59 AM 07/13/2023    8:24 AM 03/06/2023    4:25 PM  Depression screen PHQ 2/9  Decreased Interest 0 2 3  Down, Depressed, Hopeless 0 1 2  PHQ - 2 Score 0 3 5  Altered sleeping 0 2 2  Tired, decreased energy 0 2 3  Change in appetite 0 2 3  Feeling bad or failure about yourself  0 1 3  Trouble concentrating 0 0 0  Moving slowly or fidgety/restless 0 0 0  Suicidal thoughts 0 0 0  PHQ-9 Score 0  10  16   Difficult doing work/chores Not difficult at all Somewhat difficult Somewhat difficult     Data saved with a previous flowsheet row definition    BP Readings from Last 3 Encounters:  02/11/24 120/80  11/08/23 122/76  07/13/23 102/72    Physical Exam  Wt Readings from Last 3 Encounters:  02/11/24 191 lb (86.6 kg)  11/08/23 197 lb 8 oz (89.6 kg)  07/13/23 227 lb (103 kg)    There were no vitals taken for this visit.  Assessment and Plan:  Problem List Items Addressed This Visit   None   No follow-ups on file.    Leita HILARIO Adie, MD [place] [facility] [place]           [1] No Known Allergies [2]  Social History Tobacco Use   Smoking status: Former    Current packs/day: 0.00    Average packs/day: 1.0 packs/day    Types: Cigarettes    Quit date: 08/27/2009    Years since quitting: 14.7   Smokeless tobacco: Never  Vaping Use   Vaping status: Never Used  Substance Use Topics   Alcohol use: Not Currently    Alcohol/week: 0.0 standard drinks of alcohol   Drug use: No  [3]  No outpatient medications have been marked as taking for the 05/26/24 encounter  (Orders Only) with Adie Leita DEL, MD.   "

## 2024-06-02 DIAGNOSIS — H04123 Dry eye syndrome of bilateral lacrimal glands: Secondary | ICD-10-CM

## 2024-06-03 NOTE — Telephone Encounter (Signed)
 Requested Prescriptions  Pending Prescriptions Disp Refills   XIIDRA  5 % SOLN [Pharmacy Med Name: XIIDRA  5% EYE DROPS] 5 each 0    Sig: APPLY 1 DROP TO AFFECTED EYE(S) 2 TIMES DAILY.     Ophthalmology:  Glaucoma Passed - 06/03/2024 12:47 PM      Passed - Valid encounter within last 12 months    Recent Outpatient Visits           3 months ago Moderate episode of recurrent major depressive disorder [facility])   [place] [facility] at [facility], Leita DEL, MD   6 months ago Vaginal discharge   [facility] Primary Care & Sports Medicine at [facility], Leita DEL, MD   10 months ago Annual physical exam   [facility] Primary Care & Sports Medicine at [facility], Leita DEL, MD       Future Appointments             In 1 month Lemon Raisin, MD [facility] Primary Care & Sports Medicine at [facility], [phone] [place]

## 2024-06-15 ENCOUNTER — Telehealth: Admitting: Family

## 2024-06-15 DIAGNOSIS — J019 Acute sinusitis, unspecified: Secondary | ICD-10-CM

## 2024-06-15 MED ORDER — AMOXICILLIN-POT CLAVULANATE 875-125 MG PO TABS: 1.0000 | ORAL_TABLET | Freq: Two times a day (BID) | ORAL | 0 refills | Status: AC

## 2024-06-15 NOTE — Progress Notes (Signed)

## 2024-07-14 ENCOUNTER — Encounter: Payer: Medicaid Other | Admitting: Student
